# Patient Record
Sex: Female | Born: 1955
Health system: Southern US, Community
[De-identification: ages and names within clinical notes are randomized; demographics above are authoritative.]

## PROBLEM LIST (undated history)

## (undated) DIAGNOSIS — K222 Esophageal obstruction: Secondary | ICD-10-CM

## (undated) DIAGNOSIS — F419 Anxiety disorder, unspecified: Secondary | ICD-10-CM

## (undated) DIAGNOSIS — D649 Anemia, unspecified: Secondary | ICD-10-CM

## (undated) DIAGNOSIS — M5430 Sciatica, unspecified side: Secondary | ICD-10-CM

## (undated) DIAGNOSIS — G4700 Insomnia, unspecified: Secondary | ICD-10-CM

## (undated) DIAGNOSIS — G459 Transient cerebral ischemic attack, unspecified: Secondary | ICD-10-CM

## (undated) DIAGNOSIS — K449 Diaphragmatic hernia without obstruction or gangrene: Secondary | ICD-10-CM

## (undated) DIAGNOSIS — K259 Gastric ulcer, unspecified as acute or chronic, without hemorrhage or perforation: Secondary | ICD-10-CM

## (undated) DIAGNOSIS — G43909 Migraine, unspecified, not intractable, without status migrainosus: Secondary | ICD-10-CM

## (undated) DIAGNOSIS — M1990 Unspecified osteoarthritis, unspecified site: Secondary | ICD-10-CM

## (undated) DIAGNOSIS — K922 Gastrointestinal hemorrhage, unspecified: Secondary | ICD-10-CM

## (undated) DIAGNOSIS — I10 Essential (primary) hypertension: Secondary | ICD-10-CM

## (undated) HISTORY — DX: Anemia, unspecified: D64.9

## (undated) HISTORY — DX: Insomnia, unspecified: G47.00

## (undated) HISTORY — DX: Esophageal obstruction: K22.2

## (undated) HISTORY — DX: Gastric ulcer, unspecified as acute or chronic, without hemorrhage or perforation: K25.9

## (undated) HISTORY — DX: Essential (primary) hypertension: I10

## (undated) HISTORY — PX: CATARACT EXTRACTION, BILATERAL: SHX1313

## (undated) HISTORY — DX: Diaphragmatic hernia without obstruction or gangrene: K44.9

## (undated) HISTORY — PX: COLONOSCOPY: SHX174

## (undated) HISTORY — DX: Gastrointestinal hemorrhage, unspecified: K92.2

## (undated) HISTORY — DX: Unspecified osteoarthritis, unspecified site: M19.90

## (undated) HISTORY — DX: Anxiety disorder, unspecified: F41.9

---

## 2000-09-14 ENCOUNTER — Encounter (INDEPENDENT_AMBULATORY_CARE_PROVIDER_SITE_OTHER): Payer: Self-pay | Admitting: Specialist

## 2000-09-14 ENCOUNTER — Encounter: Payer: Self-pay | Admitting: Emergency Medicine

## 2000-09-14 ENCOUNTER — Encounter: Payer: Self-pay | Admitting: Neurology

## 2000-09-14 ENCOUNTER — Inpatient Hospital Stay (HOSPITAL_COMMUNITY): Admission: EM | Admit: 2000-09-14 | Discharge: 2000-09-20 | Payer: Self-pay | Admitting: Emergency Medicine

## 2000-09-14 HISTORY — PX: CAROTID ARTERY ANGIOPLASTY: SHX1300

## 2000-09-16 ENCOUNTER — Encounter: Payer: Self-pay | Admitting: Neurology

## 2001-08-23 ENCOUNTER — Emergency Department (HOSPITAL_COMMUNITY): Admission: EM | Admit: 2001-08-23 | Discharge: 2001-08-23 | Payer: Self-pay | Admitting: Emergency Medicine

## 2001-08-23 ENCOUNTER — Encounter: Payer: Self-pay | Admitting: Emergency Medicine

## 2010-11-01 NOTE — Op Note (Signed)
[place]. [facility]  Patient:    Tracy Jensen, Tracy Jensen                         MRN: [id number] Proc. Date: 09/18/00 Adm. Date:  [id number] Attending:  Fenton Malling CC:         Dr. Colon Flattery, [facility]  Genene Churn. Love, M.D.   Operative Report  PREOPERATIVE DIAGNOSIS:  Nondisabling left hemispheric cerebrovascular accident.  POSTOPERATIVE DIAGNOSES: 1. Nondisabling left hemispheric cerebrovascular accident. 2. Limited left common carotid artery dissection. 3. Left internal carotid artery stenosis.  PROCEDURES: 1. Left carotid endarterectomy. 2. Repair of limited left common carotid artery dissection. 3. Dacron patch angioplasty.  SURGEON:  Denman George, M.D.  ASSISTANT:  Tollie Pizza. Thomasena Edis, P.A.-C.  ANESTHESIA:  General endotracheal.  ANESTHESIOLOGIST:  Maren Beach, M.D.  CLINICAL NOTE:  This is a 55 year old female who was admitted to [facility] on September 14, 2000.  The patient presented with transient right-sided weakness and speech changes.  Workup for this including MRI revealed two small cerebrovascular accidents in the left hemisphere.  Doppler evaluation revealed left internal carotid artery stenosis.  Arteriography revealed a stenotic area at the left external carotid artery origin with a deep ulcer or pseudoaneurysm.  The patient was seen in consultation.  Very unusual arteriography findings were noted.  However, with the patients symptoms, it was felt that carotid endarterectomy and repair of the deep ulcer were indicated.  Risks and benefits of the operative procedure were explained to the patient and family in detail.  Major morbidity and mortality of approximately 2% to include but not limited to MI, CVA, cranial nerve injury, and death.  The patient consented for surgery.  DESCRIPTION OF PROCEDURE:  Patient brought to the operating room in stable condition.  Placed in supine position.  General endotracheal  anesthesia induced.  Foley catheter, arterial line in place.  Left neck prepped and draped in a sterile fashion.  Curvilinear skin incision made along the anterior border of the left sternomastoid muscle.  Incision extended deeply through the subcutaneous tissue.  Dissection carried down through the platysma with electrocautery. Deep dissection carried along the anterior border of the sternomastoid to the carotid sheath.  The common carotid artery was mobilized at the level of the omohyoid, encircled with a vessel loop.  Evaluation of the common carotid artery did reveal a double shadow appearance consistent with a possible dissection in the common carotid artery.  Distal dissection then carefully carried up to the carotid bifurcation.  The patient was administered 5000 units of heparin intravenously initially.  The origin of the left external carotid and superior thyroid were encircled with fine vessel loops.  The internal carotid artery dissected distally.  There was an area of apparent ulceration in the left internal carotid artery at its origin.  Distal to this, the artery appeared normal and was encircled with a vessel loop.  The hypoglossal nerve reflected superiorly and preserved.  The patient administered sodium additional 2000 units of heparin intravenously.  Following this, the carotid vessels were controlled with clamps.  A longitudinal arteriotomy made in the common carotid artery.  At this time, it was evident there was a recent dissection, which was limited to the left common carotid artery.  There was fresh thrombus present in the false lumen.  The arteriotomy was extended up across the carotid bulb and into the internal carotid artery.  There was plaque at the  left carotid bifurcation and a deep ulcer present.  The left internal carotid artery distal to this revealed thickened intima but no significant stenosis.  The left internal carotid artery was of small caliber,  however did initially accept a shunt. The shunt, however, could not be placed down into the common carotid artery out of fear for further damage with the dissection.  Therefore, the patient was not shunted.  Brisk backbleeding was present from the left internal carotid artery.  The neck incision was then extended proximally down to the sternal notch due to the extent of the common carotid dissection.  The common carotid artery mobilized down to this point.  The vagus nerve identified and reflected posteriorly and preserved.  The common carotid artery appeared normal at the sternal notch.  Clamped at this level and opened longitudinally down to this. The proximal origin of the dissection in the common carotid artery was identified.  The dissected segment was endarterectomized, and the thrombus was removed.  The endarterectomy carried up into the bulb, where the superior thyroid and external carotid were endarterectomized using eversion technique. The ulcerated segment in the internal carotid artery was endarterectomized, and the intima was divided distally.  There was some thickened intima distally in the internal carotid artery, and this was tacked down with interrupted 7-0 Prolene suture.  The long arteriotomy of the common carotid artery was then closed with a running 6-0 Prolene suture.  A patch angioplasty of the carotid bifurcation and the internal carotid artery was carried out with running 6-0 Prolene suture and a Finesse patch.  At completion of the patch angioplasty, all the vessels were flushed.  Clamps were removed.  Initial antegrade flow directed up the external carotid artery.  Following this, the internal carotid was released.  There was an excellent pulse and Doppler signal in the distal internal carotid artery.  Adequate hemostasis obtained.  The patient administered 50 mg of protamine intravenously.  The left neck incision was drained with a round Blake 15 [place]  drain.  This  was exited inferiorly through the skin, affixed to the skin with a 2-0 silk suture.  Adequate hemostasis obtained.  The patient administered 50 mg of protamine intravenously.  Sponge and instrument counts were correct.  Sternomastoid fascia closed with running 2-0 Vicryl suture.  Platysma closed with running 3-0 Vicryl suture.  Skin closed with 4-0 Monocryl.  Half-inch Steri-Strips applied.  Anesthesia was reversed in the operating room.  The patient awakened readily, moved all extremities to command.  Transferred to the recovery room in stable neurologic condition. DD:  09/18/00 TD:  09/19/00 Job: [id number]

## 2010-11-01 NOTE — H&P (Signed)
[place]. [facility]  Patient:    Tracy Jensen, Tracy Jensen                           MRN: [id number] Adm. Date:  09/14/00 Attending:  Kelli Hope, M.D.                         History and Physical  DATE OF BIRTH:  06-22-1955  CHIEF COMPLAINT:  Transient right-sided weakness and speech changes.  HISTORY OF PRESENT ILLNESS:  This is the first [facility] admission for Tracy Jensen, a 55 year old woman with a past medical history of dysfunctional uterine bleeding.  She was brought to the emergency room after suffering a transient acute neurological change this morning.  She says she awoke this morning at about 6:30 a.m. and found that she could not get out of bed due to weakness of her right upper extremity.  She says that the right upper extremity simply hung at her side, and she really could not move it or use it to do anything.  Also on trying to get out of bed, she noted trouble walking.  She was not really sure of why, but said that her right leg might have been weak.  She subsequently had to crawl to her sons room, and she said it took about one hour to get there.  Once she got there, she tried to tell him to call her doctor, but she had trouble getting words out.  She thinks that this was a word-finding problem, rather than a slurring of speech problem.  The whole episode lasted for approximately 75 minutes, and then largely resolved in terms of motor symptoms, although there is some mild word-finding problems which seem to have persisted a little longer until the time she was evaluated by the EMS.  Early this morning she saw her primary physician, who recommended that she come here, and she was brought down from Dr. Meredith Leeds office in [place] by EMS, for further evaluation.  Presently she feels completely normal.  She denies any history of previous similar events.  There is no associated chest pain, shortness of  breath, palpitations, or headache.  PAST MEDICAL HISTORY:  Remarkable for a long history of heavy menses for many years.  She has been on oral contraceptives for approximately 20 years for this reason.  FAMILY HISTORY:  Remarkable for diabetes and coronary artery disease in her mother.  SOCIAL HISTORY:  She denies tobacco use.  She lives with her husband.  She is normally independent in her activities of daily living.  ALLERGIES:  No known drug allergies.  CURRENT MEDICATIONS: 1. Oral contraceptives, exact dose unknown. 2. Multivitamins.  REVIEW OF SYSTEMS:  CONSTITUTIONAL:  No fever or chills, weight gain or weight loss.  HEAD:  No headache.  EYES:  She wears glasses.  No field loss.  EARS: No hearing changes.  ENT:  No URI symptoms.  CV:  No chest pain or palpitations.  RESPIRATORY:  No shortness of breath or cough.  GI:  No nausea, vomiting, or diarrhea.  GU:  No urinary symptoms.  HEMATOLOGY:  She does have a tendency to bruise easily.  PSYCH:  No change in thinking or moods.  PHYSICAL EXAMINATION:  VITAL SIGNS:  Temperature 98.8 degrees, blood pressure 142/84, pulse 72, respirations 18.  GENERAL:  She is alert with no evidence of stress.  HEENT:  Head:  Cranium is normocephalic, atraumatic.  Oropharynx benign.  NECK:  Supple without bruits.  CHEST:  Clear to auscultation.  HEART:  A regular rate and rhythm without murmurs.  ABDOMEN:  Soft, nondistended.  EXTREMITIES:  No edema, with 2+ pulses.  NEUROLOGIC:  Mental status:  She is awake, alert, completely oriented. Speech is fluent, not dysarthric.  She has no difficulty with confrontational naming.  Memory and concentration are intact.  Fund of knowledge is adequate. She is able to name the presidents to [employer].  Cranial nerves II-XII:  Funduscopic examination is benign.  Pupils equal, briskly reactive. Extraocular movements normal without nystagmus.  Visual fields full to confrontation.  Facial  strength and sensation are normal.  Hearing is intact to finger rub.  Face, tongue, palate normal and symmetric.  Shoulder shrug is symmetric.  Motor examination:  Normal bulk and tone.  Normal strength in all tested extremity muscles.  Sensation intact to light touch, vibratory, and pinprick sensation in all extremities.  Cerebellar:  Rapid alternating movements are performed normally.  Finger-to-nose, and heel-to-shin reveal no dysmetria.  Gait is normal.  She is able to heel, toe, and tandem walk. Reflexes are slightly brisk but not pathologic.  Toes are downgoing.  LABORATORY DATA:  CBC:  White cells 9.6, hemoglobin 11.7, platelets 206,000. I-STAT is unremarkable.  CT of the head is personally reviewed and is normal.  IMPRESSION:  Left brain transient ischemic attack in a young woman, with no risk factors except use of birth control pills.  PLAN: 1. MRI/MRA. 2. Carotid Dopplers. 3. Echocardiogram. 4. Workup for hypercoagulable state. 5. Heparin for now.  DISPOSITION:  Pending workup. DD:  09/14/00 TD:  09/14/00 Job: [id number]

## 2010-11-01 NOTE — Discharge Summary (Signed)
[place]. [facility]  Patient:    Tracy Jensen, Tracy Jensen                         MRN: [id number] Adm. Date:  [id number] Disc. Date: 09/20/00 Attending:  Fenton Malling Dictator:   Tollie Pizza. Thomasena Edis, P.A.-C. CC:         Colon Flattery, D.O., [place], [place]  Genene Churn. Love, M.D.   Discharge Summary  PRIMARY ADMITTING DIAGNOSIS:  Left cerebrovascular accident.  ADDITIONAL DISCHARGE DIAGNOSES: 1. Left common carotid artery and internal carotid artery dissection. 2. Dysfunctional uterine bleeding.  PROCEDURES PERFORMED: 1. Cerebral arteriogram. 2. Left carotid endarterectomy with Dacron patch angioplasty. 3. Repair of left common artery and internal carotid artery dissection.  HISTORY OF PRESENT ILLNESS:  The patient is a 55 year old, white female, with no significant past medical history except dysfunctional uterine bleeding for which she takes oral contraceptives.  She developed an acute onset of right-sided weakness prior to this admission with inability to move her right upper extremity and slurred speech.  This episode resolved after approximately 75 minutes.  However, the patient was seen by her primary care physician, who is Colon Flattery in [place], [place].  It is felt that she should undergo further evaluation, therefore she was sent by EMS to [facility] for further work-up.  HOSPITAL COURSE:  She was admitted and seen in consultation by neurology.  An MRI showed left middle cerebral artery infarction which was felt to be embolic in nature.  A transesophageal echocardiogram was performed to rule out cardiac source for emboli and this was negative.  A carotid Doppler study showed 40-60% left internal carotid artery stenosis.  A followup cerebral angiogram showed a questionable left ICA pseudoaneurysm.  It was felt that she should undergo surgical intervention for her carotid disease at this time.  She was seen in consultation by Dr. Madilyn Fireman and  was taken to the operating room on April 5 for left carotid endarterectomy.  Intraoperatively, she was found to have two discrete areas of dissection, one in the left common carotid artery and one in the internal carotid artery.  These areas both had evidence of hemorrhage and clot.  Both dissections were repaired and the artery was endarterectomized with Dacron patch angioplasty.  She tolerated the procedure well and was transferred from the recovery area to the floor in stable condition.  Postoperatively, her neurologic status has remained intact and she has had no further neurological symptoms.  She has remained afebrile and all vital signs have been stable.  She has been out of bed ambulating without difficulty and has been weaned off supplemental oxygen.  Her labs were within normal limits with hematocrit 29%, potassium 3.3, creatinine 0.7.  O2 saturations are 99% on room air.  It is felt that she can be discharged home.  DISCHARGE INSTRUCTIONS:  She is to refrain from heavy lifting or strenuous activity.  She will continue a low-fat, low sodium diet.  She may shower daily and clean her incisions with soap and water and dry thoroughly.  She is asked to call our office if she develops any swelling of the neck, incisional redness, or drainage, right-sided weakness or numbness, or fever greater than 101.  DISCHARGE MEDICATIONS: 1. Tylox 1-2 q.4h. p.r.n. for pain. 2. Enteric-coated aspirin 325 mg daily. 3. Pepcid 20 mg daily. 4. She is to continue her birth control pills which she was previously    taking at home.  FOLLOW-UP APPOINTMENT:  She will see Dr. Madilyn Fireman back in the office in three weeks for recheck and is asked to call Dr. Dewaine Conger for an appointment in three to four weeks following discharge. DD:  09/19/00 TD:  09/19/00 Job: [id number]

## 2016-02-08 ENCOUNTER — Ambulatory Visit: Payer: Self-pay | Admitting: Physician Assistant

## 2016-02-15 ENCOUNTER — Other Ambulatory Visit: Payer: Self-pay | Admitting: Physician Assistant

## 2016-02-22 ENCOUNTER — Other Ambulatory Visit: Payer: Self-pay | Admitting: Physician Assistant

## 2016-02-22 NOTE — Telephone Encounter (Signed)
Message left for patient that you are out of til Monday

## 2016-02-25 MED ORDER — BUTALBITAL-APAP-CAFFEINE 50-325-40 MG PO TABS: 1.0000 | ORAL_TABLET | Freq: Three times a day (TID) | ORAL | 0 refills | Status: DC | PRN

## 2016-02-25 NOTE — Telephone Encounter (Signed)
I am not certain of her medication. Can she have pharmacy send?

## 2016-02-25 NOTE — Telephone Encounter (Signed)
rx called into pharmacy

## 2016-04-01 ENCOUNTER — Other Ambulatory Visit: Payer: Self-pay | Admitting: Physician Assistant

## 2016-04-02 ENCOUNTER — Encounter (HOSPITAL_COMMUNITY): Payer: Self-pay

## 2016-04-02 ENCOUNTER — Emergency Department (HOSPITAL_COMMUNITY): Payer: 59

## 2016-04-02 ENCOUNTER — Telehealth: Payer: Self-pay | Admitting: *Deleted

## 2016-04-02 ENCOUNTER — Emergency Department (HOSPITAL_COMMUNITY)
Admission: EM | Admit: 2016-04-02 | Discharge: 2016-04-02 | Disposition: A | Discharge status: Home / Self Care | Payer: 59 | Attending: Emergency Medicine | Admitting: Emergency Medicine

## 2016-04-02 DIAGNOSIS — G510 Bell's palsy: Secondary | ICD-10-CM | POA: Insufficient documentation

## 2016-04-02 DIAGNOSIS — Z79899 Other long term (current) drug therapy: Secondary | ICD-10-CM | POA: Insufficient documentation

## 2016-04-02 DIAGNOSIS — Z8673 Personal history of transient ischemic attack (TIA), and cerebral infarction without residual deficits: Secondary | ICD-10-CM | POA: Diagnosis not present

## 2016-04-02 DIAGNOSIS — I639 Cerebral infarction, unspecified: Secondary | ICD-10-CM

## 2016-04-02 DIAGNOSIS — R29810 Facial weakness: Secondary | ICD-10-CM

## 2016-04-02 DIAGNOSIS — R791 Abnormal coagulation profile: Secondary | ICD-10-CM | POA: Insufficient documentation

## 2016-04-02 HISTORY — DX: Sciatica, unspecified side: M54.30

## 2016-04-02 HISTORY — DX: Transient cerebral ischemic attack, unspecified: G45.9

## 2016-04-02 HISTORY — DX: Migraine, unspecified, not intractable, without status migrainosus: G43.909

## 2016-04-02 LAB — I-STAT TROPONIN, ED: TROPONIN I, POC: 0 ng/mL (ref 0.00–0.08)

## 2016-04-02 LAB — DIFFERENTIAL
Basophils Absolute: 0 10*3/uL (ref 0.0–0.1)
Basophils Relative: 1 %
EOS ABS: 0.3 10*3/uL (ref 0.0–0.7)
EOS PCT: 6 %
LYMPHS ABS: 1.9 10*3/uL (ref 0.7–4.0)
LYMPHS PCT: 38 %
Monocytes Absolute: 0.4 10*3/uL (ref 0.1–1.0)
Monocytes Relative: 7 %
Neutro Abs: 2.4 10*3/uL (ref 1.7–7.7)
Neutrophils Relative %: 48 %

## 2016-04-02 LAB — COMPREHENSIVE METABOLIC PANEL
ALK PHOS: 83 U/L (ref 38–126)
ALT: 27 U/L (ref 14–54)
ANION GAP: 8 (ref 5–15)
AST: 23 U/L (ref 15–41)
Albumin: 4.2 g/dL (ref 3.5–5.0)
BILIRUBIN TOTAL: 0.5 mg/dL (ref 0.3–1.2)
BUN: 17 mg/dL (ref 6–20)
CALCIUM: 9.1 mg/dL (ref 8.9–10.3)
CO2: 25 mmol/L (ref 22–32)
Chloride: 106 mmol/L (ref 101–111)
Creatinine, Ser: 0.8 mg/dL (ref 0.44–1.00)
GFR calc non Af Amer: >60 mL/min (ref >60)
Glucose, Bld: 89 mg/dL (ref 65–99)
POTASSIUM: 3.4 mmol/L — AB (ref 3.5–5.1)
Sodium: 139 mmol/L (ref 135–145)
TOTAL PROTEIN: 6.7 g/dL (ref 6.5–8.1)

## 2016-04-02 LAB — I-STAT CHEM 8, ED
BUN: 20 mg/dL (ref 6–20)
CALCIUM ION: 1.13 mmol/L — AB (ref 1.15–1.40)
Chloride: 105 mmol/L (ref 101–111)
Creatinine, Ser: 0.8 mg/dL (ref 0.44–1.00)
Glucose, Bld: 86 mg/dL (ref 65–99)
HEMATOCRIT: 38 % (ref 36.0–46.0)
HEMOGLOBIN: 12.9 g/dL (ref 12.0–15.0)
Potassium: 3.5 mmol/L (ref 3.5–5.1)
SODIUM: 142 mmol/L (ref 135–145)
TCO2: 26 mmol/L (ref 0–100)

## 2016-04-02 LAB — CBC
HCT: 37.5 % (ref 36.0–46.0)
HEMOGLOBIN: 12.3 g/dL (ref 12.0–15.0)
MCH: 30.1 pg (ref 26.0–34.0)
MCHC: 32.8 g/dL (ref 30.0–36.0)
MCV: 91.9 fL (ref 78.0–100.0)
PLATELETS: 214 10*3/uL (ref 150–400)
RBC: 4.08 MIL/uL (ref 3.87–5.11)
RDW: 13 % (ref 11.5–15.5)
WBC: 5 10*3/uL (ref 4.0–10.5)

## 2016-04-02 LAB — ETHANOL: Alcohol, Ethyl (B): <5 mg/dL (ref <5)

## 2016-04-02 LAB — PROTIME-INR
INR: 0.92
PROTHROMBIN TIME: 12.4 s (ref 11.4–15.2)

## 2016-04-02 LAB — APTT: aPTT: 30 seconds (ref 24–36)

## 2016-04-02 MED ORDER — PREDNISONE 20 MG PO TABS: 40.0000 mg | ORAL_TABLET | Freq: Every day | ORAL | 0 refills | Status: DC

## 2016-04-02 MED ORDER — ERYTHROMYCIN 5 MG/GM OP OINT
1.0000 "application " | TOPICAL_OINTMENT | Freq: Once | OPHTHALMIC | Status: AC
  Administered 2016-04-02: 1 via OPHTHALMIC
  Filled 2016-04-02: qty 3.5

## 2016-04-02 MED ORDER — VALACYCLOVIR HCL 1 G PO TABS: 1000.0000 mg | ORAL_TABLET | Freq: Three times a day (TID) | ORAL | 0 refills | Status: AC

## 2016-04-02 NOTE — Consult Note (Signed)
Requesting Physician: Dr. Anitra Lauth    Chief Complaint: right sided facial droop  History obtained from:  Patient     HPI:                                                                                                                                         Tracy Jensen is an 60 y.o. female who is been suffering from headache for the last 2 days. Patient was doing well this morning when her husband noted at around 845 that she had a right-sided facial droop. He does admit that he was not really paying attention to her this morning is unclear when the actual last seen normal was. The circumflex was called however on physical exam patient has decreased ability to referral her right for head, decreased ability to bury her right eyelid along with week eyelid strength, and right facial droop--that is very indicative of a Bell's palsy. CT scan of brain was negative. Patient states that she has no phonophobia, decreased or blurred vision, slurred speech, upper or lower extremity strength decrease.  Date last known well: Woke up symptomatic, unknown Time last known well: Unable to determine tPA Given: No: minimal symptoms and likely bells palsy   Past Medical History:  Diagnosis Date  . Migraines   . Sciatica   . TIA (transient ischemic attack)     No past surgical history on file.  No family history on file. Social History:  reports that she has never smoked. She has never used smokeless tobacco. She reports that she drinks alcohol. She reports that she does not use drugs.  Allergies:  Allergies  Allergen Reactions  . Azithromycin Hives    Hives after Zpack    Medications:                                                                                                                           No current facility-administered medications for this encounter.    Current Outpatient Prescriptions  Medication Sig Dispense Refill  . butalbital-acetaminophen-caffeine (FIORICET, ESGIC)  50-325-40 MG tablet TAKE 1 TABLET EVERY 8 HOURS AS NEEDED FOR HEADACHE 30 tablet 0  . predniSONE (DELTASONE) 20 MG tablet Take 2 tablets (40 mg total) by mouth daily. 10 tablet 0  . valACYclovir (VALTREX) 1000 MG tablet Take 1 tablet (1,000 mg total) by mouth 3 (three) times  daily. 30 tablet 0     ROS:                                                                                                                                       History obtained from the patient  General ROS: negative for - chills, fatigue, fever, night sweats, weight gain or weight loss Psychological ROS: negative for - behavioral disorder, hallucinations, memory difficulties, mood swings or suicidal ideation Ophthalmic ROS: negative for - blurry vision, double vision, eye pain or loss of vision ENT ROS: negative for - epistaxis, nasal discharge, oral lesions, sore throat, tinnitus or vertigo Allergy and Immunology ROS: negative for - hives or itchy/watery eyes Hematological and Lymphatic ROS: negative for - bleeding problems, bruising or swollen lymph nodes Endocrine ROS: negative for - galactorrhea, hair pattern changes, polydipsia/polyuria or temperature intolerance Respiratory ROS: negative for - cough, hemoptysis, shortness of breath or wheezing Cardiovascular ROS: negative for - chest pain, dyspnea on exertion, edema or irregular heartbeat Gastrointestinal ROS: negative for - abdominal pain, diarrhea, hematemesis, nausea/vomiting or stool incontinence Genito-Urinary ROS: negative for - dysuria, hematuria, incontinence or urinary frequency/urgency Musculoskeletal ROS: negative for - joint swelling or muscular weakness Neurological ROS: as noted in HPI Dermatological ROS: negative for rash and skin lesion changes  Neurologic Examination:                                                                                                      Blood pressure 137/87, pulse 76, temperature 97.7 F (36.5 C), temperature  source Oral, resp. rate 19, SpO2 100 %.  HEENT-  Normocephalic, no lesions, without obvious abnormality.  Normal external eye and conjunctiva.  Normal TM's bilaterally.  Normal auditory canals and external ears. Normal external nose, mucus membranes and septum.  Normal pharynx. Cardiovascular- S1, S2 normal, pulses palpable throughout   Lungs- chest clear, no wheezing, rales, normal symmetric air entry Abdomen- normal findings: bowel sounds normal Extremities- no edema Lymph-no adenopathy palpable Musculoskeletal-no joint tenderness, deformity or swelling Skin-warm and dry, no hyperpigmentation, vitiligo, or suspicious lesions  Neurological Examination Mental Status: Alert, oriented, thought content appropriate.  Speech fluent without evidence of aphasia.  Able to follow 3 step commands without difficulty. Cranial Nerves: II: Discs flat bilaterally; Visual fields grossly normal, pupils equal, round, reactive to light and accommodation III,IV, VI: ptosis not present, extra-ocular motions intact bilaterally, when blinking her right eye closes slower than her left V,VII: smile asymmetric on the right, facial light touch  sensation normal bilaterally, decreased for furrowing of her right forehead VIII: hearing normal bilaterally IX,X: uvula rises symmetrically XI: bilateral shoulder shrug XII: midline tongue extension Motor: Right : Upper extremity   5/5    Left:     Upper extremity   5/5  Lower extremity   5/5     Lower extremity   5/5 Tone and bulk:normal tone throughout; no atrophy noted Sensory: Pinprick and light touch intact throughout, bilaterally Deep Tendon Reflexes: 2+ and symmetric throughout Plantars: Right: downgoing   Left: downgoing Cerebellar: normal finger-to-nose, normal rapid alternating movements and normal heel-to-shin test Gait: Not tested       Lab Results: Basic Metabolic Panel:  Recent Labs Lab 04/02/16 0919 04/02/16 0923  NA 139 142  K 3.4* 3.5  CL  106 105  CO2 25  --   GLUCOSE 89 86  BUN 17 20  CREATININE 0.80 0.80  CALCIUM 9.1  --     Liver Function Tests:  Recent Labs Lab 04/02/16 0919  AST 23  ALT 27  ALKPHOS 83  BILITOT 0.5  PROT 6.7  ALBUMIN 4.2   No results for input(s): LIPASE, AMYLASE in the last 168 hours. No results for input(s): AMMONIA in the last 168 hours.  CBC:  Recent Labs Lab 04/02/16 0919 04/02/16 0923  WBC 5.0  --   NEUTROABS 2.4  --   HGB 12.3 12.9  HCT 37.5 38.0  MCV 91.9  --   PLT 214  --     Cardiac Enzymes: No results for input(s): CKTOTAL, CKMB, CKMBINDEX, TROPONINI in the last 168 hours.  Lipid Panel: No results for input(s): CHOL, TRIG, HDL, CHOLHDL, VLDL, LDLCALC in the last 168 hours.  CBG: No results for input(s): GLUCAP in the last 168 hours.  Microbiology: No results found for this or any previous visit.  Coagulation Studies:  Recent Labs  04/02/16 0919  LABPROT 12.4  INR 0.92    Imaging: Ct Head Code Stroke Wo Contrast`  Result Date: 04/02/2016 CLINICAL DATA:  Code stroke.  Headache.  Right facial droop EXAM: CT HEAD WITHOUT CONTRAST TECHNIQUE: Contiguous axial images were obtained from the base of the skull through the vertex without intravenous contrast. COMPARISON:  None. FINDINGS: Brain: No evidence of acute infarction, hemorrhage, hydrocephalus, extra-axial collection or mass lesion/mass effect. Vascular: No hyperdense vessel or unexpected calcification. Skull: Negative Sinuses/Orbits: Negative Other: None ASPECTS (Alberta Stroke Program Early CT Score) - Ganglionic level infarction (caudate, lentiform nuclei, internal capsule, insula, M1-M3 cortex): 7 - Supraganglionic infarction (M4-M6 cortex): 3 Total score (0-10 with 10 being normal): 10 IMPRESSION: 1. Negative CT head 2. ASPECTS is 10 These results were called by telephone at the time of interpretation on 04/02/2016 at 9:35 am to [facility], who verbally acknowledged these results. Electronically Signed   By:  Marlan Palau M.D.   On: 04/02/2016 09:35     Assessment: 60 y.o. female presenting to [facility] after husband noted that she had a right facial droop. On exam she is a droop is very indicative of a Bell's palsy. She has decreased for head wrinkling on the right, decreased blink on right, and right facial droop, sensation intact. She has no upper or lower extremity weakness or decreased sensation. CT of head was negative.  Stroke Risk Factors - none   Recommend: 1) consider prednisone and acyclovir to hasten the recovery phase this is been discussed with Dr. Anitra Lauth 2) consider eyedrops if she is unable to fully close her  right eye.  No further recommendations. Neurology will sign off and patient has been placed back in the care of ED M.D.   Personally examined patient and images, and have participated in and made any corrections needed to history, physical, neuro exam,assessment and plan as stated above.  I have personally obtained the history, evaluated lab date, reviewed imaging studies and agree with radiology interpretations.    Naomie DeanAntonia Ahern, MD [facility] [phone]

## 2016-04-02 NOTE — Code Documentation (Signed)
60[facility] at [phone].  Patient from home where she and her husband were preparing for a trip when she had sudden onset right sided facial weakness at 0815.  Patient reports headache x 2 days, but denies headache today.  Patient presented to the ED where a code stroke was activated.  Stroke team to the bedside.  Patient to CT 2.  CT completed.  NIHSS 3, see documentation for details and code stroke times.  Patient with facial weakness to the upper and lower right face.  Dr. Lucia GaskinsAhern at the bedside.  Patient with suspected Bell's palsy.  No acute stroke treatment at this time.  Code stroke canceled.  Handoff with ED RN Nehemiah SettleBrooke.

## 2016-04-02 NOTE — ED Triage Notes (Signed)
Pt c/o h/a Monday and Tuesday, denies today. Pt husband noticed right sided facial droop this morning around 0830, uncertain if droop was present last night. No other weakness or complaints at this time. Hx mini stroke.

## 2016-04-02 NOTE — Telephone Encounter (Signed)
Pharm tech called to verify Rx given.

## 2016-04-02 NOTE — ED Provider Notes (Signed)
[facility]-EMERGENCY DEPT Provider Note   CSN: [id number] Arrival date & time: 04/02/16  [id number]   An emergency department physician performed an initial assessment on this suspected stroke patient at 0910 (Farron Watrous).  History   Chief Complaint Chief Complaint  Patient presents with  . Facial Droop    HPI Tracy Jensen is a 60 y.o. female.  Patient is a 60 year old female with a history of TIA, migraines and sciatica who presents today with a 2 day history of severe migraine in the right side of her head that resolved morning and 45 minutes prior to arrival she developed right-sided facial droop. She denies any numbness in the right side of her face, speech or swallowing difficulty. Vision is unchanged. She denies any upper or lower extremity involvement. No chest pain, shortness of breath or neck pain. Earlier she did have some burning in her ear on the right side which is now resolved.  No recent illnesses but [place] so [place] sick [place].   The history is provided by the patient.    Past Medical History:  Diagnosis Date  . Migraines   . Sciatica   . TIA (transient ischemic attack)     There are no active problems to display for this patient.   No past surgical history on file.  OB History    No data available       Home Medications    Prior to Admission medications   Medication Sig Start Date End Date Taking? Authorizing Provider  butalbital-acetaminophen-caffeine (FIORICET, ESGIC) 50-325-40 MG tablet TAKE 1 TABLET EVERY 8 HOURS AS NEEDED FOR HEADACHE 04/01/16   Remus LofflerAngel S Jones, PA-C    Family History No family history on file.  Social History Social History  Substance Use Topics  . Smoking status: Never Smoker  . Smokeless tobacco: Never Used  . Alcohol use Yes     Comment: occ     Allergies   Azithromycin   Review of Systems Review of Systems  All other systems reviewed and are negative.    Physical  Exam Updated Vital Signs BP 157/99 (BP Location: Right Arm)   Pulse 83   Temp 97.7 F (36.5 C)   Resp 13   SpO2 100%   Physical Exam  Constitutional: She is oriented to person, place, and time. She appears well-developed and well-nourished. No distress.  HENT:  Head: Normocephalic and atraumatic.  Right Ear: Tympanic membrane normal.  Left Ear: Tympanic membrane normal.  Mouth/Throat: Oropharynx is clear and moist.  No vesicles present in the right ear canal or on the TM  Eyes: Conjunctivae and EOM are normal. Pupils are equal, round, and reactive to light.  Neck: Normal range of motion. Neck supple.  Cardiovascular: Normal rate, regular rhythm and intact distal pulses.   No murmur heard. Pulmonary/Chest: Effort normal and breath sounds normal. No respiratory distress. She has no wheezes. She has no rales.  Abdominal: Soft. She exhibits no distension. There is no tenderness. There is no rebound and no guarding.  Musculoskeletal: Normal range of motion. She exhibits no edema or tenderness.  Neurological: She is alert and oriented to person, place, and time. She has normal strength. A cranial nerve deficit is present. No sensory deficit. Coordination and gait normal.  Right sided facial droop with forehead involvement.  Normal sensation over the face bilaterally.  5 out of 5 strength in the upper and lower extremities bilaterally  Skin: Skin is warm and dry.  No rash noted. No erythema.  Psychiatric: She has a normal mood and affect. Her behavior is normal.  Nursing note and vitals reviewed.    ED Treatments / Results  Labs (all labs ordered are listed, but only abnormal results are displayed) Labs Reviewed  COMPREHENSIVE METABOLIC PANEL - Abnormal; Notable for the following:       Result Value   Potassium 3.4 (*)    All other components within normal limits  I-STAT CHEM 8, ED - Abnormal; Notable for the following:    Calcium, Ion 1.13 (*)    All other components within normal  limits  PROTIME-INR  APTT  CBC  DIFFERENTIAL  ETHANOL  RAPID URINE DRUG SCREEN, HOSP PERFORMED  URINALYSIS, ROUTINE W REFLEX MICROSCOPIC (NOT AT [facility])  Rosezena Sensor, ED    EKG  EKG Interpretation  Date/Time:  Wednesday April 02 2016 09:13:31 EDT Ventricular Rate:  87 PR Interval:    QRS Duration: 103 QT Interval:  371 QTC Calculation: 447 R Axis:   -7 Text Interpretation:  Sinus rhythm Probable lateral infarct, old Artifact No previous tracing Confirmed by Anitra Lauth  MD, Sharman Garrott ([id number]) on 04/02/2016 9:55:52 AM       Radiology Ct Head Code Stroke Wo Contrast`  Result Date: 04/02/2016 CLINICAL DATA:  Code stroke.  Headache.  Right facial droop EXAM: CT HEAD WITHOUT CONTRAST TECHNIQUE: Contiguous axial images were obtained from the base of the skull through the vertex without intravenous contrast. COMPARISON:  None. FINDINGS: Brain: No evidence of acute infarction, hemorrhage, hydrocephalus, extra-axial collection or mass lesion/mass effect. Vascular: No hyperdense vessel or unexpected calcification. Skull: Negative Sinuses/Orbits: Negative Other: None ASPECTS (Alberta Stroke Program Early CT Score) - Ganglionic level infarction (caudate, lentiform nuclei, internal capsule, insula, M1-M3 cortex): 7 - Supraganglionic infarction (M4-M6 cortex): 3 Total score (0-10 with 10 being normal): 10 IMPRESSION: 1. Negative CT head 2. ASPECTS is 10 These results were called by telephone at the time of interpretation on 04/02/2016 at 9:35 am to [facility], who verbally acknowledged these results. Electronically Signed   By: Marlan Palau M.D.   On: 04/02/2016 09:35    Procedures Procedures (including critical care time)  Medications Ordered in ED Medications - No data to display   Initial Impression / Assessment and Plan / ED Course  I have reviewed the triage vital signs and the nursing notes.  Pertinent labs & imaging results that were available during my care of the patient were  reviewed by me and considered in my medical decision making (see chart for details).  Clinical Course    Patient presents with sudden onset of right-sided facial droop approximately 45 minutes prior to arrival. Headache noted for the last 2 days but now resolved. Patient has no other symptoms at this time. She does have a history of prior TIA but that was years ago resulting in a stent in her left carotid. She has no extremity complaints. Initially in triage patient was called a code stroke however based on exam and history this appears to be Bell's palsy. Neurology evaluated the patient agrees that she has Bell's palsy. Labs without acute findings within normal creatinine and CT negative. Will start patient on prednisone and Valtrex.  Final Clinical Impressions(s) / ED Diagnoses   Final diagnoses:  Bell's palsy    New Prescriptions New Prescriptions   PREDNISONE (DELTASONE) 20 MG TABLET    Take 2 tablets (40 mg total) by mouth daily.   VALACYCLOVIR (VALTREX) 1000 MG TABLET  Take 1 tablet (1,000 mg total) by mouth 3 (three) times daily.     Gwyneth Sprout, MD 04/02/16 1012

## 2016-04-07 ENCOUNTER — Ambulatory Visit: Payer: Self-pay | Admitting: Physician Assistant

## 2016-04-08 ENCOUNTER — Encounter: Payer: Self-pay | Admitting: Physician Assistant

## 2016-04-08 ENCOUNTER — Ambulatory Visit (INDEPENDENT_AMBULATORY_CARE_PROVIDER_SITE_OTHER): Payer: 59 | Admitting: Physician Assistant

## 2016-04-08 VITALS — BP 130/87 | HR 78 | Temp 98.3°F | Ht <= 58 in | Wt 117.4 lb

## 2016-04-08 DIAGNOSIS — F320 Major depressive disorder, single episode, mild: Secondary | ICD-10-CM

## 2016-04-08 DIAGNOSIS — I10 Essential (primary) hypertension: Secondary | ICD-10-CM | POA: Diagnosis not present

## 2016-04-08 DIAGNOSIS — F329 Major depressive disorder, single episode, unspecified: Secondary | ICD-10-CM | POA: Insufficient documentation

## 2016-04-08 DIAGNOSIS — Z8673 Personal history of transient ischemic attack (TIA), and cerebral infarction without residual deficits: Secondary | ICD-10-CM | POA: Diagnosis not present

## 2016-04-08 DIAGNOSIS — G510 Bell's palsy: Secondary | ICD-10-CM | POA: Diagnosis not present

## 2016-04-08 DIAGNOSIS — F32A Depression, unspecified: Secondary | ICD-10-CM | POA: Insufficient documentation

## 2016-04-08 MED ORDER — PREDNISONE 20 MG PO TABS: 40.0000 mg | ORAL_TABLET | Freq: Every day | ORAL | 0 refills | Status: DC

## 2016-04-08 NOTE — Patient Instructions (Addendum)
Bell Palsy °Bell palsy is a condition in which the muscles on one side of the face become paralyzed. This often causes one side of the face to droop. It is a common condition and most people recover completely. °RISK FACTORS °Risk factors for Bell palsy include: °· Pregnancy. °· Diabetes. °· An infection by a virus, such as infections that cause cold sores. °CAUSES  °Bell palsy is caused by damage to or inflammation of a nerve in your face. It is unclear why this happens, but an infection by a virus may lead to it. Most of the time the reason it happens is unknown. °SIGNS AND SYMPTOMS  °Symptoms can range from mild to severe and can take place over a number of hours. Symptoms may include: °· Being unable to: °¨ Raise one or both eyebrows. °¨ Close one or both eyes. °¨ Feel parts of your face (facial numbness). °· Drooping of the eyelid and corner of the mouth. °· Weakness in the face. °· Paralysis of half your face. °· Loss of taste. °· Sensitivity to loud noises. °· Difficulty chewing. °· Tearing up of the affected eye. °· Dryness in the affected eye. °· Drooling. °· Pain behind one ear. °DIAGNOSIS  °Diagnosis of Bell palsy may include: °· A medical history and physical exam. °· An MRI. °· A CT scan. °· Electromyography (EMG). This is a test that checks how your nerves are working. °TREATMENT  °Treatment may include antiviral medicine to help shorten the length of the condition. Sometimes treatment is not needed and the symptoms go away on their own. °HOME CARE INSTRUCTIONS  °· Take medicines only as directed by your health care provider. °· Do facial massages and exercises as directed by your health care provider. °· If your eye is affected: °¨ Use moisturizing eye drops to prevent drying of your eye as directed by your health care provider. °¨ Protect your eye as directed by your health care provider. °SEEK MEDICAL CARE IF: °· Your symptoms do not get better or get worse. °· You are drooling. °· Your eye is red,  irritated, or hurts. °SEEK IMMEDIATE MEDICAL CARE IF:  °· Another part of your body feels weak or numb. °· You have difficulty swallowing. °· You have a fever along with symptoms of Bell palsy. °· You develop neck pain. °MAKE SURE YOU:  °· Understand these instructions. °· Will watch your condition. °· Will get help right away if you are not doing well or get worse. °  °This information is not intended to replace advice given to you by your health care provider. Make sure you discuss any questions you have with your health care provider. °  °Document Released: 06/02/2005 Document Revised: 02/21/2015 Document Reviewed: 09/09/2013 °Elsevier Interactive Patient Education ©2016 [employer] ° °

## 2016-04-08 NOTE — Progress Notes (Signed)
BP 130/87   Pulse 78   Temp 98.3 F (36.8 C) (Oral)   Ht 4\' 9"  (1.448 m)   Wt 117 lb 6.4 oz (53.3 kg)   BMI 25.41 kg/m    Subjective:    Patient ID: Tracy Jensen, female    DOB: December 28, 1955, 60 y.o.   MRN: [id number]  Tracy GessLynn H Cabello is a 60 y.o. female presenting on 04/08/2016 for ER follow up (Bells Palsy )  HPI Patient here to be established as [facility].  This patient is known to me from [facility]. Approximately 6 days ago the patient was seen through the emergency room for bells palsy. Due to her previous history of a CVA and TIA activity she did have a CAT scan that was normal. She has had classic Bell's palsy symptoms of right-sided weakness and inability to blink. She is starting to have more pain in this area since the feeling is coming back. She is also seen her eye doctor for drops to help with the dryness. The eye doctor has reported that she start he had 50% improvement in just 2 days. She is to continue all the medications that were given to her through the emergency room. We will give her note for out today.  Past Medical History:  Diagnosis Date  . Hypertension   . Migraines   . Sciatica   . TIA (transient ischemic attack)    Relevant past medical, surgical, family and social history reviewed and updated as indicated. Interim medical history since our last visit reviewed. Allergies and medications reviewed and updated.   Data reviewed from any sources in EPIC.  Review of Systems  Eyes: Positive for photophobia and pain.  Respiratory: Negative.   Gastrointestinal: Negative.   Genitourinary: Negative.   Neurological: Positive for facial asymmetry, weakness, numbness and headaches.     Social History   Social History  . Marital status: Single    Spouse name: N/A  . Number of children: N/A  . Years of education: N/A   Occupational History  . Not on file.   Social History Main Topics  . Smoking status:  Never Smoker  . Smokeless tobacco: Never Used  . Alcohol use Yes     Comment: occ  . Drug use: No  . Sexual activity: Not on file   Other Topics Concern  . Not on file   Social History Narrative  . No narrative on file    History reviewed. No pertinent surgical history.  History reviewed. No pertinent family history.    Medication List       Accurate as of 04/08/16  2:49 PM. Always use your most recent med list.          ALPRAZolam 0.5 MG tablet Commonly known as:  XANAX Take 0.5 mg by mouth 3 (three) times daily as needed for anxiety.   aspirin EC 81 MG tablet Take 81 mg by mouth daily.   butalbital-acetaminophen-caffeine 50-325-40 MG tablet Commonly known as:  FIORICET, ESGIC TAKE 1 TABLET EVERY 8 HOURS AS NEEDED FOR HEADACHE   citalopram 40 MG tablet Commonly known as:  CELEXA Take 40 mg by mouth daily.   cyclobenzaprine 10 MG tablet Commonly known as:  FLEXERIL Take 10 mg by mouth 3 (three) times daily.   diclofenac 75 MG EC tablet Commonly known as:  VOLTAREN Take 75 mg by mouth 2 (two) times daily.   lisinopril 20 MG tablet Commonly known as:  PRINIVIL,ZESTRIL Take 20 mg by mouth daily.   predniSONE 20 MG tablet Commonly known as:  DELTASONE Take 2 tablets (40 mg total) by mouth daily.   traZODone 50 MG tablet Commonly known as:  DESYREL Take 100 mg by mouth at bedtime.   valACYclovir 1000 MG tablet Commonly known as:  VALTREX Take 1 tablet (1,000 mg total) by mouth 3 (three) times daily.          Objective:    BP 130/87   Pulse 78   Temp 98.3 F (36.8 C) (Oral)   Ht 4\' 9"  (1.448 m)   Wt 117 lb 6.4 oz (53.3 kg)   BMI 25.41 kg/m   Allergies  Allergen Reactions  . Azithromycin Hives    Hives after Zpack   Wt Readings from Last 3 Encounters:  04/08/16 117 lb 6.4 oz (53.3 kg)    Physical Exam  Constitutional: She is oriented to person, place, and time. She appears well-developed and well-nourished.  HENT:  Head:  Normocephalic and atraumatic.  Eyes: Conjunctivae and EOM are normal. Pupils are equal, round, and reactive to light.  Cardiovascular: Normal rate, regular rhythm, normal heart sounds and intact distal pulses.   Pulmonary/Chest: Effort normal and breath sounds normal.  Abdominal: Soft. Bowel sounds are normal.  Neurological: She is alert and oriented to person, place, and time. She has normal reflexes. She displays no tremor. A cranial nerve deficit and sensory deficit is present. She exhibits abnormal muscle tone.  Right trigeminal region with weakness, loss of motor and decreased sensation.  Vision is being watched by her eye doctor.  Skin: Skin is warm and dry. No rash noted.  Psychiatric: She has a normal mood and affect. Her behavior is normal. Judgment and thought content normal.        Assessment & Plan:   1. Bell's palsy - predniSONE (DELTASONE) 20 MG tablet; Take 2 tablets (40 mg total) by mouth daily.  Dispense: 10 tablet; Refill: 0 Completes eye drops and valtrex 2. History of CVA (cerebrovascular accident)  3. HTN (hypertension), benign  4. Mild single current episode of major depressive disorder (HCC)   Continue all other maintenance medications as listed above. Educational handout given for bell's palsy  Follow up plan: Return if symptoms worsen or fail to improve.  Remus Loffler PA-C [facility] [address]  [place], [place] [address] [phone]   04/08/2016, 2:49 PM

## 2016-04-14 ENCOUNTER — Telehealth: Payer: Self-pay | Admitting: Physician Assistant

## 2016-04-14 DIAGNOSIS — G510 Bell's palsy: Secondary | ICD-10-CM

## 2016-04-14 MED ORDER — PREDNISONE 20 MG PO TABS: 40.0000 mg | ORAL_TABLET | Freq: Every day | ORAL | 0 refills | Status: DC

## 2016-04-14 NOTE — Telephone Encounter (Signed)
Yes, you can send prednisone 10mg  6 day pack #1 as directed

## 2016-04-14 NOTE — Telephone Encounter (Signed)
Medication sent.

## 2016-04-15 NOTE — Telephone Encounter (Signed)
Patient aware that medication has been sent to the pharmacy 

## 2016-04-28 ENCOUNTER — Other Ambulatory Visit: Payer: Self-pay | Admitting: Physician Assistant

## 2016-05-06 ENCOUNTER — Encounter: Payer: Self-pay | Admitting: Pediatrics

## 2016-05-06 ENCOUNTER — Ambulatory Visit (INDEPENDENT_AMBULATORY_CARE_PROVIDER_SITE_OTHER): Payer: 59 | Admitting: Pediatrics

## 2016-05-06 VITALS — BP 137/87 | HR 86 | Temp 98.0°F | Resp 20 | Ht <= 58 in | Wt 116.8 lb

## 2016-05-06 DIAGNOSIS — H6502 Acute serous otitis media, left ear: Secondary | ICD-10-CM | POA: Diagnosis not present

## 2016-05-06 MED ORDER — AMOXICILLIN 500 MG PO CAPS: 500.0000 mg | ORAL_CAPSULE | Freq: Two times a day (BID) | ORAL | 0 refills | Status: DC

## 2016-05-06 NOTE — Progress Notes (Signed)
  Subjective:   Patient ID: Tracy Jensen, female    DOB: May 24, 1956, 60 y.o.   MRN: [id number] CC: Sore Throat; Headache; and Cough  HPI: Tracy Jensen is a 60 y.o. female presenting for Sore Throat; Headache; and Cough  Dry, scratchy irritated throat for past week Several people at work sick No fevers Some congestion, some headache hacky dry cough Taking zyrtec Feels slightly better today  Relevant past medical, surgical, family and social history reviewed. Allergies and medications reviewed and updated. History  Smoking Status  . Never Smoker  Smokeless Tobacco  . Never Used   ROS: Per HPI   Objective:    BP 137/87   Pulse 86   Temp 98 F (36.7 C) (Oral)   Resp 20   Ht 4\' 9"  (1.448 m)   Wt 116 lb 12.8 oz (53 kg)   SpO2 99%   BMI 25.28 kg/m   Wt Readings from Last 3 Encounters:  05/06/16 116 lb 12.8 oz (53 kg)  04/08/16 117 lb 6.4 oz (53.3 kg)    Gen: NAD, alert, cooperative with exam, NCAT, congested EYES: EOMI, no conjunctival injection, or no icterus ENT:  L ear red, bulging, clear fluid, OP without erythema LYMPH: no cervical LAD CV: NRRR, normal S1/S2, no murmur, distal pulses 2+ b/l Resp: CTABL, no wheezes, normal WOB Neuro: Alert and oriented  Assessment & Plan:  Tracy Jensen was seen today for sore throat, headache and cough.  Diagnoses and all orders for this visit:  Acute serous otitis media of left ear, recurrence not specified -     amoxicillin (AMOXIL) 500 MG capsule; Take 1 capsule (500 mg total) by mouth 2 (two) times daily.   Follow up plan: Return if symptoms worsen or fail to improve. Rex Krasarol Bleu Moisan, MD [facility]

## 2016-05-29 ENCOUNTER — Other Ambulatory Visit: Payer: Self-pay | Admitting: *Deleted

## 2016-05-29 MED ORDER — CYCLOBENZAPRINE HCL 10 MG PO TABS: 10.0000 mg | ORAL_TABLET | Freq: Three times a day (TID) | ORAL | 0 refills | Status: DC

## 2016-05-29 MED ORDER — CITALOPRAM HYDROBROMIDE 40 MG PO TABS: 40.0000 mg | ORAL_TABLET | Freq: Every day | ORAL | 0 refills | Status: DC

## 2016-06-02 ENCOUNTER — Other Ambulatory Visit: Payer: Self-pay | Admitting: Physician Assistant

## 2016-06-03 NOTE — Telephone Encounter (Signed)
Forwarding to PCP.

## 2016-06-05 ENCOUNTER — Other Ambulatory Visit: Payer: Self-pay | Admitting: Physician Assistant

## 2016-06-05 NOTE — Telephone Encounter (Signed)
Phoned in Approved [facility]

## 2016-07-01 ENCOUNTER — Other Ambulatory Visit: Payer: Self-pay | Admitting: *Deleted

## 2016-07-01 MED ORDER — TRAZODONE HCL 50 MG PO TABS: 100.0000 mg | ORAL_TABLET | Freq: Every day | ORAL | 11 refills | Status: DC

## 2016-07-07 ENCOUNTER — Other Ambulatory Visit: Payer: Self-pay | Admitting: Physician Assistant

## 2016-07-14 ENCOUNTER — Encounter: Payer: Self-pay | Admitting: Physician Assistant

## 2016-07-14 ENCOUNTER — Other Ambulatory Visit: Payer: Self-pay | Admitting: Physician Assistant

## 2016-07-14 ENCOUNTER — Ambulatory Visit (INDEPENDENT_AMBULATORY_CARE_PROVIDER_SITE_OTHER): Payer: 59 | Admitting: Physician Assistant

## 2016-07-14 VITALS — BP 103/74 | HR 67 | Temp 97.6°F | Ht <= 58 in | Wt 114.0 lb

## 2016-07-14 DIAGNOSIS — G43809 Other migraine, not intractable, without status migrainosus: Secondary | ICD-10-CM

## 2016-07-14 DIAGNOSIS — R37 Sexual dysfunction, unspecified: Secondary | ICD-10-CM | POA: Diagnosis not present

## 2016-07-14 DIAGNOSIS — M7021 Olecranon bursitis, right elbow: Secondary | ICD-10-CM

## 2016-07-14 DIAGNOSIS — F32A Depression, unspecified: Secondary | ICD-10-CM

## 2016-07-14 DIAGNOSIS — F329 Major depressive disorder, single episode, unspecified: Secondary | ICD-10-CM

## 2016-07-14 DIAGNOSIS — G43909 Migraine, unspecified, not intractable, without status migrainosus: Secondary | ICD-10-CM | POA: Insufficient documentation

## 2016-07-14 MED ORDER — PREDNISONE 10 MG (21) PO TBPK: ORAL_TABLET | ORAL | 0 refills | Status: DC

## 2016-07-14 MED ORDER — CITALOPRAM HYDROBROMIDE 40 MG PO TABS: 40.0000 mg | ORAL_TABLET | Freq: Every day | ORAL | 3 refills | Status: DC

## 2016-07-14 MED ORDER — METHYLPREDNISOLONE ACETATE 80 MG/ML IJ SUSP
80.0000 mg | Freq: Once | INTRAMUSCULAR | Status: AC
  Administered 2016-07-14: 80 mg via INTRAMUSCULAR

## 2016-07-14 MED ORDER — SILDENAFIL CITRATE 20 MG PO TABS: 20.0000 mg | ORAL_TABLET | Freq: Every day | ORAL | 0 refills | Status: DC

## 2016-07-14 MED ORDER — BUTALBITAL-APAP-CAFFEINE 50-325-40 MG PO TABS: 1.0000 | ORAL_TABLET | Freq: Three times a day (TID) | ORAL | 5 refills | Status: DC | PRN

## 2016-07-14 MED ORDER — CYCLOBENZAPRINE HCL 10 MG PO TABS
10.0000 mg | ORAL_TABLET | Freq: Three times a day (TID) | ORAL | 5 refills | Status: DC
Start: 2016-07-14 — End: 2017-03-31

## 2016-07-14 NOTE — Progress Notes (Signed)
BP 103/74   Pulse 67   Temp 97.6 F (36.4 C) (Oral)   Ht 4\' 9"  (1.448 m)   Wt 114 lb (51.7 kg)   BMI 24.67 kg/m    Subjective:    Patient ID: Tracy Jensen, female    DOB: 01/25/1956, 61 y.o.   MRN: [id number]  HPI: Tracy Jensen is a 61 y.o. female presenting on 07/14/2016 for Tennis elbow (Right arm )  Patient has had a recurrence of her right elbow tennis elbow. 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 She states she is in the most pain when she rotates the arm at the elbow such as in turning a doorknob. She denies any severe swelling or redness to the joint. There is been no trauma.  Past Medical History:  Diagnosis Date  . Hypertension   . Migraines   . Sciatica   . TIA (transient ischemic attack)    Relevant past medical, surgical, family and social history reviewed and updated as indicated. Interim medical history since our last visit reviewed. Allergies and medications reviewed and updated. DATA REVIEWED: CHART IN EPIC  Social History   Social History  . Marital status: Single    Spouse name: N/A  . Number of children: N/A  . Years of education: N/A   Occupational History  . Not on file.   Social History Main Topics  . Smoking status: Never Smoker  . Smokeless tobacco: Never Used  . Alcohol use Yes     Comment: occ  . Drug use: No  . Sexual activity: Not on file   Other Topics Concern  . Not on file   Social History Narrative  . No narrative on file    History reviewed. No pertinent surgical history.  History reviewed. No pertinent family history.  Review of Systems  Constitutional: Negative.  Negative for activity change, fatigue and fever.  HENT: Negative.   Eyes: Negative.   Respiratory: Negative.  Negative for cough.   Cardiovascular: Negative.  Negative for chest pain.  Gastrointestinal: Negative.  Negative for abdominal pain.  Endocrine: Negative.   Genitourinary: Negative.  Negative for dysuria.    Musculoskeletal: Positive for arthralgias. Negative for joint swelling, neck pain and neck stiffness.  Skin: Negative.   Neurological: Negative.     Allergies as of 07/14/2016      Reactions   Azithromycin Hives   Hives after Zpack      Medication List       Accurate as of 07/14/16  8:37 AM. Always use your most recent med list.          ALPRAZolam 0.5 MG tablet Commonly known as:  XANAX Take 0.5 mg by mouth 3 (three) times daily as needed for anxiety.   aspirin EC 81 MG tablet Take 81 mg by mouth daily.   butalbital-acetaminophen-caffeine 50-325-40 MG tablet Commonly known as:  FIORICET, ESGIC Take 1 tablet by mouth every 8 (eight) hours as needed for headache.   citalopram 40 MG tablet Commonly known as:  CELEXA Take 1 tablet (40 mg total) by mouth daily.   cyclobenzaprine 10 MG tablet Commonly known as:  FLEXERIL Take 1 tablet (10 mg total) by mouth 3 (three) times daily.   diclofenac 75 MG EC tablet Commonly known as:  VOLTAREN Take 75 mg by mouth 2 (two) times daily.   lisinopril 20 MG tablet Commonly known as:  PRINIVIL,ZESTRIL Take 20 mg by mouth daily.  predniSONE 10 MG (21) Tbpk tablet Commonly known as:  STERAPRED UNI-PAK 21 TAB As directed x 6 days   sildenafil 20 MG tablet Commonly known as:  REVATIO Take 1-2.5 tablets (20-50 mg total) by mouth daily.   traZODone 50 MG tablet Commonly known as:  DESYREL Take 2 tablets (100 mg total) by mouth at bedtime.          Objective:    BP 103/74   Pulse 67   Temp 97.6 F (36.4 C) (Oral)   Ht 4\' 9"  (1.448 m)   Wt 114 lb (51.7 kg)   BMI 24.67 kg/m   Allergies  Allergen Reactions  . Azithromycin Hives    Hives after Zpack    Wt Readings from Last 3 Encounters:  07/14/16 114 lb (51.7 kg)  05/06/16 116 lb 12.8 oz (53 kg)  04/08/16 117 lb 6.4 oz (53.3 kg)    Physical Exam  Constitutional: She is oriented to person, place, and time. She appears well-developed and well-nourished.  HENT:   Head: Normocephalic and atraumatic.  Right Ear: Tympanic membrane, external ear and ear canal normal.  Left Ear: Tympanic membrane, external ear and ear canal normal.  Nose: Nose normal. No rhinorrhea.  Mouth/Throat: Oropharynx is clear and moist and mucous membranes are normal. No oropharyngeal exudate or posterior oropharyngeal erythema.  Eyes: Conjunctivae and EOM are normal. Pupils are equal, round, and reactive to light.  Neck: Normal range of motion. Neck supple.  Cardiovascular: Normal rate, regular rhythm, normal heart sounds and intact distal pulses.   Pulmonary/Chest: Effort normal and breath sounds normal.  Abdominal: Soft. Bowel sounds are normal.  Musculoskeletal:       Right elbow: She exhibits swelling. She exhibits normal range of motion and no deformity. Tenderness found. Lateral epicondyle tenderness noted.       Arms: Neurological: She is alert and oriented to person, place, and time. She has normal reflexes.  Skin: Skin is warm and dry. No rash noted.  Psychiatric: She has a normal mood and affect. Her behavior is normal. Judgment and thought content normal.  Nursing note and vitals reviewed.      Assessment & Plan:   1. Olecranon bursitis of right elbow - methylPREDNISolone acetate (DEPO-MEDROL) injection 80 mg; Inject 1 mL (80 mg total) into the muscle once. - predniSONE (STERAPRED UNI-PAK 21 TAB) 10 MG (21) TBPK tablet; As directed x 6 days  Dispense: 21 tablet; Refill: 0  2. Other migraine without status migrainosus, not intractable - butalbital-acetaminophen-caffeine (FIORICET, ESGIC) 50-325-40 MG tablet; Take 1 tablet by mouth every 8 (eight) hours as needed for headache.  Dispense: 30 tablet; Refill: 5  3. Depression, unspecified depression type - citalopram (CELEXA) 40 MG tablet; Take 1 tablet (40 mg total) by mouth daily.  Dispense: 90 tablet; Refill: 3  4. Sexual dysfunction - sildenafil (REVATIO) 20 MG tablet; Take 1-2.5 tablets (20-50 mg total) by  mouth daily.  Dispense: 30 tablet; Refill: 0   Continue all other maintenance medications as listed above.  Follow up plan: Follow up as needed  Educational handout given for tennis elbow  Remus LofflerAngel S. Bralon Antkowiak PA-C [facility] [address]  [place], [place] [id number] [phone]   07/14/2016, 8:37 AM

## 2016-07-14 NOTE — Patient Instructions (Signed)
Tennis Elbow Tennis elbow is puffiness (inflammation) of the outer tendons of your forearm close to your elbow. Your tendons attach your muscles to your bones. Tennis elbow can happen in any sport or job in which you use your elbow too much. It is caused by doing the same motion over and over. Tennis elbow can cause:  Pain and tenderness in your forearm and the outer part of your elbow.  A burning feeling. This runs from your elbow through your arm.  Weak grip in your hands. Follow these instructions at home: Activity  Rest your elbow and wrist as told by your doctor. Try to avoid any activities that caused the problem until your doctor says that you can do them again.  If a physical therapist teaches you exercises, do all of them as told.  If you lift an object, lift it with your palm facing up. This is easier on your elbow. Lifestyle  If your tennis elbow is caused by sports, check your equipment and make sure that:  You are using it correctly.  It fits you well.  If your tennis elbow is caused by work, take breaks often, if you are able. Talk with your manager about doing your work in a way that is safe for you.  If your tennis elbow is caused by computer use, talk with your manager about any changes that can be made to your work setup. General instructions  If told, apply ice to the painful area:  Put ice in a plastic bag.  Place a towel between your skin and the bag.  Leave the ice on for 20 minutes, 2-3 times per day.  Take medicines only as told by your doctor.  If you were given a brace, wear it as told by your doctor.  Keep all follow-up visits as told by your doctor. This is important. Contact a doctor if:  Your pain does not get better with treatment.  Your pain gets worse.  You have weakness in your forearm, hand, or fingers.  You cannot feel your forearm, hand, or fingers. This information is not intended to replace advice given to you by your health  care provider. Make sure you discuss any questions you have with your health care provider. Document Released: 11/20/2009 Document Revised: 01/31/2016 Document Reviewed: 05/29/2014 Elsevier Interactive Patient Education  2017 Elsevier Inc.  

## 2016-08-07 ENCOUNTER — Other Ambulatory Visit: Payer: Self-pay | Admitting: *Deleted

## 2016-08-07 MED ORDER — DICLOFENAC SODIUM 75 MG PO TBEC: 75.0000 mg | DELAYED_RELEASE_TABLET | Freq: Two times a day (BID) | ORAL | 0 refills | Status: DC

## 2016-08-18 ENCOUNTER — Other Ambulatory Visit: Payer: Self-pay | Admitting: Physician Assistant

## 2016-08-18 DIAGNOSIS — G43809 Other migraine, not intractable, without status migrainosus: Secondary | ICD-10-CM

## 2016-08-18 NOTE — Telephone Encounter (Signed)
Last OV w/ angel - 06/2016. I think this one prints if approved

## 2016-09-03 ENCOUNTER — Other Ambulatory Visit: Payer: Self-pay | Admitting: Physician Assistant

## 2016-09-04 NOTE — Telephone Encounter (Signed)
Last filled 08/01/16, last seen 07/14/16. Route to pool for call in

## 2016-09-04 NOTE — Telephone Encounter (Signed)
Refill called to [facility] 

## 2016-09-06 ENCOUNTER — Other Ambulatory Visit: Payer: Self-pay | Admitting: Physician Assistant

## 2016-09-17 ENCOUNTER — Ambulatory Visit (INDEPENDENT_AMBULATORY_CARE_PROVIDER_SITE_OTHER): Payer: 59 | Admitting: Family Medicine

## 2016-09-17 ENCOUNTER — Encounter: Payer: Self-pay | Admitting: Family Medicine

## 2016-09-17 VITALS — BP 109/74 | HR 64 | Temp 97.3°F | Ht <= 58 in | Wt 114.0 lb

## 2016-09-17 DIAGNOSIS — M7711 Lateral epicondylitis, right elbow: Secondary | ICD-10-CM

## 2016-09-17 MED ORDER — METHOCARBAMOL 500 MG PO TABS: 500.0000 mg | ORAL_TABLET | Freq: Three times a day (TID) | ORAL | 0 refills | Status: DC | PRN

## 2016-09-17 MED ORDER — PREDNISONE 20 MG PO TABS: ORAL_TABLET | ORAL | 0 refills | Status: DC

## 2016-09-17 NOTE — Progress Notes (Signed)
BP 109/74   Pulse 64   Temp 97.3 F (36.3 C) (Oral)   Ht  (1.448 m)   Wt 114 lb (51.7 kg)   BMI 24.67 kg/m    Subjective:    Patient ID: Tracy Jensen, female    DOB: 1956-01-10, 61 y.o.   MRN: [id number]  HPI: Tracy Jensen is a 61 y.o. female presenting on 09/17/2016 for Elbow Pain (pain in right elbow radiating into upper arm, has history of tennis elbow and usually given prednisone dose pack)   HPI Right lateral elbow pain Patient is having right lateral elbow pain with repetitive movements that she doesn't work. She had this once in December and January and it improved with prednisone and it just came back about a week ago. She says she does a lot of repetitive forward and back motions with her hands and elbow and that's when it started to flare up again. She says it hurts on the lateral aspect of her right elbow area she denies any redness or warmth or numbness or weakness in the arm or hand. She denies any shooting pains anywhere else. She rates the pain is 410. She has been taking Tylenol and Flexeril in the Xanax and had helped that yesterday.  Relevant past medical, surgical, family and social history reviewed and updated as indicated. Interim medical history since our last visit reviewed. Allergies and medications reviewed and updated.  Review of Systems  Constitutional: Negative for chills and fever.  Respiratory: Negative for chest tightness and shortness of breath.   Cardiovascular: Negative for chest pain and leg swelling.  Genitourinary: Negative for difficulty urinating and dysuria.  Musculoskeletal: Positive for arthralgias. Negative for back pain and gait problem.  Skin: Negative for color change and rash.  Neurological: Negative for light-headedness and headaches.  Psychiatric/Behavioral: Negative for agitation and behavioral problems.  All other systems reviewed and are negative.   Per HPI unless specifically indicated above     Objective:    BP 109/74    Pulse 64   Temp 97.3 F (36.3 C) (Oral)   Ht  (1.448 m)   Wt 114 lb (51.7 kg)   BMI 24.67 kg/m   Wt Readings from Last 3 Encounters:  09/17/16 114 lb (51.7 kg)  07/14/16 114 lb (51.7 kg)  05/06/16 116 lb 12.8 oz (53 kg)    Physical Exam  Constitutional: She is oriented to person, place, and time. She appears well-developed and well-nourished. No distress.  Eyes: Conjunctivae are normal.  Musculoskeletal: Normal range of motion. She exhibits no edema.       Right elbow: She exhibits normal range of motion and no swelling. Tenderness found. Lateral epicondyle tenderness noted. No radial head, no medial epicondyle and no olecranon process tenderness noted.  Neurological: She is alert and oriented to person, place, and time. Coordination normal.  Skin: Skin is warm and dry. No rash noted. She is not diaphoretic.  Psychiatric: She has a normal mood and affect. Her behavior is normal.  Nursing note and vitals reviewed.     Assessment & Plan:   Problem List Items Addressed This Visit    None    Visit Diagnoses    Right lateral epicondylitis    -  Primary   Relevant Medications   methocarbamol (ROBAXIN) 500 MG tablet   predniSONE (DELTASONE) 20 MG tablet     Gave prednisone and Robaxin, return if worsens or does not improve.   Follow up plan: Return  if symptoms worsen or fail to improve.  Counseling provided for all of the vaccine components No orders of the defined types were placed in this encounter.   Arville Care, MD [facility] 09/17/2016, 9:19 AM

## 2016-10-29 ENCOUNTER — Other Ambulatory Visit: Payer: Self-pay | Admitting: Physician Assistant

## 2016-10-29 DIAGNOSIS — G43809 Other migraine, not intractable, without status migrainosus: Secondary | ICD-10-CM

## 2016-10-29 MED ORDER — BUTALBITAL-APAP-CAFFEINE 50-325-40 MG PO TABS: 1.0000 | ORAL_TABLET | Freq: Three times a day (TID) | ORAL | 5 refills | Status: DC

## 2016-10-29 NOTE — Progress Notes (Signed)
Rx phoned in.   

## 2016-10-30 NOTE — Telephone Encounter (Signed)
[facility] 

## 2016-11-04 ENCOUNTER — Other Ambulatory Visit: Payer: Self-pay | Admitting: Physician Assistant

## 2016-11-04 DIAGNOSIS — R37 Sexual dysfunction, unspecified: Secondary | ICD-10-CM

## 2016-11-10 ENCOUNTER — Other Ambulatory Visit: Payer: Self-pay | Admitting: Physician Assistant

## 2016-11-11 NOTE — Telephone Encounter (Signed)
Last filled 10/08/16, last seen 07/14/16. Route to pool for call in

## 2016-11-11 NOTE — Telephone Encounter (Signed)
Phoned in.

## 2016-11-27 ENCOUNTER — Other Ambulatory Visit: Payer: Self-pay | Admitting: Physician Assistant

## 2016-12-11 ENCOUNTER — Encounter: Payer: Self-pay | Admitting: Pediatrics

## 2016-12-11 ENCOUNTER — Ambulatory Visit (INDEPENDENT_AMBULATORY_CARE_PROVIDER_SITE_OTHER): Payer: 59 | Admitting: Pediatrics

## 2016-12-11 VITALS — BP 114/70 | HR 70 | Temp 97.2°F | Ht <= 58 in | Wt 113.0 lb

## 2016-12-11 DIAGNOSIS — M7711 Lateral epicondylitis, right elbow: Secondary | ICD-10-CM | POA: Diagnosis not present

## 2016-12-11 DIAGNOSIS — G43809 Other migraine, not intractable, without status migrainosus: Secondary | ICD-10-CM

## 2016-12-11 MED ORDER — PREDNISONE 10 MG PO TABS: ORAL_TABLET | ORAL | 0 refills | Status: DC

## 2016-12-11 MED ORDER — NAPROXEN 500 MG PO TABS: 500.0000 mg | ORAL_TABLET | Freq: Two times a day (BID) | ORAL | 0 refills | Status: DC

## 2016-12-11 MED ORDER — BUTALBITAL-APAP-CAFFEINE 50-325-40 MG PO TABS: 1.0000 | ORAL_TABLET | Freq: Three times a day (TID) | ORAL | 5 refills | Status: DC

## 2016-12-11 NOTE — Patient Instructions (Addendum)
Tennis Elbow Rehab Ask your health care provider which exercises are safe for you. Do exercises exactly as told by your health care provider and adjust them as directed. It is normal to feel mild stretching, pulling, tightness, or discomfort as you do these exercises, but you should stop right away if you feel sudden pain or your pain gets worse. Do not begin these exercises until told by your health care provider. Stretching and range of motion exercises These exercises warm up your muscles and joints and improve the movement and flexibility of your elbow. These exercises also help to relieve pain, numbness, and tingling. Exercise A: Wrist extensor stretch 1. Extend your left / right elbow with your fingers pointing down. 2. Gently pull the palm of your left / right hand toward you until you feel a gentle stretch on the top of your forearm. 3. To increase the stretch, push your left / right hand toward the outer edge or pinkie side of your forearm. 4. Hold this position for __________ seconds. Repeat __________ times. Complete this exercise __________ times a day. If directed by your health care provider, repeat this stretch except do it with a bent elbow this time. Exercise B: Wrist flexor stretch  1. Extend your left / right elbow and turn your palm upward. 2. Gently pull your left / right palm and fingertips back so your wrist extends and your fingers point more toward the ground. 3. You should feel a gentle stretch on the inside of your forearm. 4. Hold this position for __________ seconds. Repeat __________ times. Complete this exercise __________ times a day. If directed by your health care provider, repeat this stretch except do it with a bent elbow this time. 1. Slowly return to the starting position.

## 2016-12-11 NOTE — Progress Notes (Signed)
  Subjective:   Patient ID: Tracy Jensen, female    DOB: Sep 05, 1955, 61 y.o.   MRN: [id number] CC: Elbow Pain (right, Tennis Elbow)  HPI: Tracy Jensen is a 61 y.o. 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 Allergies and medications reviewed and updated. History  Smoking Status  . Never Smoker  Smokeless Tobacco  . Never Used   ROS: Per HPI   Objective:    BP 114/70   Pulse 70   Temp 97.2 F (36.2 C) (Oral)   Ht 4\' 9"  (1.448 m)   Wt 113 lb (51.3 kg)   BMI 24.45 kg/m   Wt Readings from Last 3 Encounters:  12/11/16 113 lb (51.3 kg)  09/17/16 114 lb (51.7 kg)  07/14/16 114 lb (51.7 kg)    Gen: NAD, alert, cooperative with exam, NCAT EYES: EOMI, no conjunctival injection, or no icterus CV: NRRR, normal S1/S2, no murmur, distal pulses 2+ b/l Resp: CTABL, no wheezes, normal WOB Ext: No edema, warm Neuro: Alert and oriented, strength equal b/l UE and LE, coordination grossly normal MSK: normal R elbow to inspection, no redness or swelling ttp over lateral epicondyle, pain with supination against resistance  Assessment & Plan:  Tracy Jensen was seen today for elbow pain.  Diagnoses and all orders for this visit:  Lateral epicondylitis of right elbow Not improving, has continued to use R arm regularly Rest, ice, counterforce brace, avoid movements that exacerbate pain Gentle stretching given -     naproxen (NAPROSYN) 500 MG tablet; Take 1 tablet (500 mg total) by mouth 2 (two) times daily with a  meal. -     predniSONE (DELTASONE) 10 MG tablet; Take 30mg  for 3 days, then 20 mg for 2 days then 10mg  for 1 day  Other migraine without status migrainosus, not intractable Stable, cont below, f/u with PCP -     butalbital-acetaminophen-caffeine (FIORICET, ESGIC) 50-325-40 MG tablet; Take 1 tablet by mouth every 8 (eight) hours.   Follow up plan: Return if symptoms worsen or fail to improve. Rex Krasarol Destyne Goodreau, MD [facility]

## 2017-01-05 ENCOUNTER — Other Ambulatory Visit: Payer: Self-pay | Admitting: *Deleted

## 2017-01-05 MED ORDER — DICLOFENAC SODIUM 75 MG PO TBEC: 75.0000 mg | DELAYED_RELEASE_TABLET | Freq: Two times a day (BID) | ORAL | 5 refills | Status: DC

## 2017-01-05 NOTE — Telephone Encounter (Signed)
Pt aware.

## 2017-02-05 ENCOUNTER — Other Ambulatory Visit: Payer: Self-pay | Admitting: Physician Assistant

## 2017-02-06 NOTE — Telephone Encounter (Signed)
rx called into pharmacy

## 2017-02-14 ENCOUNTER — Other Ambulatory Visit: Payer: Self-pay | Admitting: Physician Assistant

## 2017-02-14 DIAGNOSIS — R37 Sexual dysfunction, unspecified: Secondary | ICD-10-CM

## 2017-03-23 ENCOUNTER — Telehealth: Payer: Self-pay | Admitting: Physician Assistant

## 2017-03-23 MED ORDER — LISINOPRIL 20 MG PO TABS: 20.0000 mg | ORAL_TABLET | Freq: Every day | ORAL | 3 refills | Status: DC

## 2017-03-23 NOTE — Telephone Encounter (Signed)
Sent med

## 2017-03-31 ENCOUNTER — Other Ambulatory Visit: Payer: Self-pay | Admitting: Physician Assistant

## 2017-04-25 ENCOUNTER — Other Ambulatory Visit: Payer: Self-pay | Admitting: Physician Assistant

## 2017-04-27 NOTE — Telephone Encounter (Signed)
Phoned in.

## 2017-05-05 ENCOUNTER — Encounter: Payer: Self-pay | Admitting: Family

## 2017-05-05 ENCOUNTER — Ambulatory Visit: Payer: 59 | Admitting: Family Medicine

## 2017-05-05 ENCOUNTER — Ambulatory Visit (INDEPENDENT_AMBULATORY_CARE_PROVIDER_SITE_OTHER): Payer: 59 | Admitting: Family

## 2017-05-05 VITALS — BP 116/79 | HR 80 | Temp 97.2°F | Ht <= 58 in | Wt 115.6 lb

## 2017-05-05 DIAGNOSIS — J301 Allergic rhinitis due to pollen: Secondary | ICD-10-CM | POA: Diagnosis not present

## 2017-05-05 MED ORDER — FLUTICASONE PROPIONATE 50 MCG/ACT NA SUSP: 2.0000 | Freq: Every day | NASAL | 6 refills | Status: DC

## 2017-05-05 NOTE — Progress Notes (Signed)
   Subjective:    Patient ID: Rosalyn GessLynn H Bradsher, female    DOB: 1956/03/04, 61 y.o.   MRN: [id number]  Otalgia   There is pain in the right ear. This is a new problem. The current episode started today. The problem occurs constantly. The problem has been unchanged. There has been no fever. The pain is at a severity of 8/10. The pain is moderate. Associated symptoms include headaches. Pertinent negatives include no coughing, ear discharge, hearing loss, neck pain, rhinorrhea or sore throat. She has tried acetaminophen for the symptoms. The treatment provided mild relief.      Review of Systems  HENT: Positive for ear pain. Negative for ear discharge, hearing loss, rhinorrhea and sore throat.   Respiratory: Negative for cough.   Musculoskeletal: Negative for neck pain.  Neurological: Positive for headaches.  All other systems reviewed and are negative.      Objective:   Physical Exam  Constitutional: She is oriented to person, place, and time. She appears well-developed and well-nourished. No distress.  HENT:  Head: Normocephalic and atraumatic.  Right Ear: External ear normal. A middle ear effusion is present.  Left Ear: External ear normal. A middle ear effusion is present.  Nose: Mucosal edema and rhinorrhea present.  Mouth/Throat: Oropharynx is clear and moist.  Eyes: Pupils are equal, round, and reactive to light.  Neck: Normal range of motion. Neck supple. No thyromegaly present.  Cardiovascular: Normal rate, regular rhythm, normal heart sounds and intact distal pulses.  No murmur heard. Pulmonary/Chest: Effort normal and breath sounds normal. No respiratory distress. She has no wheezes.  Abdominal: Soft. Bowel sounds are normal. She exhibits no distension. There is no tenderness.  Musculoskeletal: Normal range of motion. She exhibits no edema or tenderness.  Neurological: She is alert and oriented to person, place, and time.  Skin: Skin is warm and dry.  Psychiatric: She has a  normal mood and affect. Her behavior is normal. Judgment and thought content normal.  Vitals reviewed.     BP 116/79   Pulse 80   Temp (!) 97.2 F (36.2 C) (Oral)   Ht 4\' 9"  (1.448 m)   Wt 115 lb 9.6 oz (52.4 kg)   BMI 25.02 kg/m      Assessment & Plan:  1. Allergic rhinitis due to pollen, unspecified seasonality Avoid allergens  Continue Zyrtec  Use humidifier  Saline spray as needed RTO Prn  - fluticasone (FLONASE) 50 MCG/ACT nasal spray; Place 2 sprays into both nostrils daily.  Dispense: 16 g; Refill: 6    Jannifer Rodneyhristy Deshay Blumenfeld, FNP

## 2017-05-05 NOTE — Patient Instructions (Signed)
Allergic Rhinitis Allergic rhinitis is when the mucous membranes in the nose respond to allergens. Allergens are particles in the air that cause your body to have an allergic reaction. This causes you to release allergic antibodies. Through a chain of events, these eventually cause you to release histamine into the blood stream. Although meant to protect the body, it is this release of histamine that causes your discomfort, such as frequent sneezing, congestion, and an itchy, runny nose. What are the causes? Seasonal allergic rhinitis (hay fever) is caused by pollen allergens that may come from grasses, trees, and weeds. Year-round allergic rhinitis (perennial allergic rhinitis) is caused by allergens such as house dust mites, pet dander, and mold spores. What are the signs or symptoms?  Nasal stuffiness (congestion).  Itchy, runny nose with sneezing and tearing of the eyes. How is this diagnosed? Your health care provider can help you determine the allergen or allergens that trigger your symptoms. If you and your health care provider are unable to determine the allergen, skin or blood testing may be used. Your health care provider will diagnose your condition after taking your health history and performing a physical exam. Your health care provider may assess you for other related conditions, such as asthma, pink eye, or an ear infection. How is this treated? Allergic rhinitis does not have a cure, but it can be controlled by:  Medicines that block allergy symptoms. These may include allergy shots, nasal sprays, and oral antihistamines.  Avoiding the allergen. Hay fever may often be treated with antihistamines in pill or nasal spray forms. Antihistamines block the effects of histamine. There are over-the-counter medicines that may help with nasal congestion and swelling around the eyes. Check with your health care provider before taking or giving this medicine. If avoiding the allergen or the  medicine prescribed do not work, there are many new medicines your health care provider can prescribe. Stronger medicine may be used if initial measures are ineffective. Desensitizing injections can be used if medicine and avoidance does not work. Desensitization is when a patient is given ongoing shots until the body becomes less sensitive to the allergen. Make sure you follow up with your health care provider if problems continue. Follow these instructions at home: It is not possible to completely avoid allergens, but you can reduce your symptoms by taking steps to limit your exposure to them. It helps to know exactly what you are allergic to so that you can avoid your specific triggers. Contact a health care provider if:  You have a fever.  You develop a cough that does not stop easily (persistent).  You have shortness of breath.  You start wheezing.  Symptoms interfere with normal daily activities. This information is not intended to replace advice given to you by your health care provider. Make sure you discuss any questions you have with your health care provider. Document Released: 02/25/2001 Document Revised: 02/01/2016 Document Reviewed: 02/07/2013 Elsevier Interactive Patient Education  2017 Else[employer] Inc.  

## 2017-06-18 ENCOUNTER — Other Ambulatory Visit: Payer: Self-pay | Admitting: Physician Assistant

## 2017-06-23 ENCOUNTER — Encounter: Payer: Self-pay | Admitting: Family Medicine

## 2017-06-23 ENCOUNTER — Ambulatory Visit (INDEPENDENT_AMBULATORY_CARE_PROVIDER_SITE_OTHER): Payer: 59 | Admitting: Family Medicine

## 2017-06-23 VITALS — BP 124/79 | HR 83 | Temp 97.1°F | Ht <= 58 in | Wt 116.0 lb

## 2017-06-23 DIAGNOSIS — J40 Bronchitis, not specified as acute or chronic: Secondary | ICD-10-CM

## 2017-06-23 DIAGNOSIS — J329 Chronic sinusitis, unspecified: Secondary | ICD-10-CM | POA: Diagnosis not present

## 2017-06-23 MED ORDER — MOXIFLOXACIN HCL 400 MG PO TABS: 400.0000 mg | ORAL_TABLET | Freq: Every day | ORAL | 0 refills | Status: DC

## 2017-06-23 MED ORDER — HYDROCODONE-HOMATROPINE 5-1.5 MG/5ML PO SYRP: 5.0000 mL | ORAL_SOLUTION | Freq: Four times a day (QID) | ORAL | 0 refills | Status: DC | PRN

## 2017-06-23 MED ORDER — PSEUDOEPHEDRINE-GUAIFENESIN ER 60-600 MG PO TB12: 1.0000 | ORAL_TABLET | Freq: Two times a day (BID) | ORAL | 0 refills | Status: AC

## 2017-06-23 NOTE — Progress Notes (Signed)
Chief Complaint  Patient presents with  . Cough  . Nasal Congestion  . Allergies    itching/ watery eyes    HPI  Patient presents today for Patient presents with upper respiratory congestion. Rhinorrhea that is frequently purulent. There is moderate sore throat. Patient reports coughing frequently as well. Thick sputum noted. There is no fever, chills or sweats. The patient denies being short of breath. Onset was 1 week ago. Gradually worsening. Tried OTCs without improvement.  PMH: Smoking status noted ROS: Per HPI  Objective: BP 124/79   Pulse 83   Temp (!) 97.1 F (36.2 C) (Oral)   Ht 4\' 9"  (1.448 m)   Wt 116 lb (52.6 kg)   BMI 25.10 kg/m  Gen: NAD, alert, cooperative with exam HEENT: NCAT, Nasal passages swollen, red TMS RED CV: RRR, good S1/S2, no murmur Resp: Bronchitis changes with scattered wheezes, non-labored Ext: No edema, warm Neuro: Alert and oriented, No gross deficits  Assessment and plan:  1. Sinobronchitis     No orders of the defined types were placed in this encounter.   No orders of the defined types were placed in this encounter.   Follow up as needed.  Mechele ClaudeWarren Jack Mineau, MD

## 2017-06-29 ENCOUNTER — Ambulatory Visit: Payer: 59 | Admitting: Physician Assistant

## 2017-07-03 ENCOUNTER — Encounter: Payer: Self-pay | Admitting: Physician Assistant

## 2017-07-03 ENCOUNTER — Ambulatory Visit (INDEPENDENT_AMBULATORY_CARE_PROVIDER_SITE_OTHER): Payer: 59 | Admitting: Physician Assistant

## 2017-07-03 VITALS — BP 124/80 | HR 72 | Temp 97.5°F | Ht <= 58 in | Wt 114.2 lb

## 2017-07-03 DIAGNOSIS — F411 Generalized anxiety disorder: Secondary | ICD-10-CM

## 2017-07-03 DIAGNOSIS — F32A Depression, unspecified: Secondary | ICD-10-CM

## 2017-07-03 DIAGNOSIS — J0111 Acute recurrent frontal sinusitis: Secondary | ICD-10-CM

## 2017-07-03 DIAGNOSIS — G43809 Other migraine, not intractable, without status migrainosus: Secondary | ICD-10-CM

## 2017-07-03 DIAGNOSIS — F329 Major depressive disorder, single episode, unspecified: Secondary | ICD-10-CM | POA: Diagnosis not present

## 2017-07-03 DIAGNOSIS — R37 Sexual dysfunction, unspecified: Secondary | ICD-10-CM | POA: Diagnosis not present

## 2017-07-03 MED ORDER — AMOXICILLIN-POT CLAVULANATE 875-125 MG PO TABS: 1.0000 | ORAL_TABLET | Freq: Two times a day (BID) | ORAL | 0 refills | Status: DC

## 2017-07-03 MED ORDER — SILDENAFIL CITRATE 20 MG PO TABS: ORAL_TABLET | ORAL | 5 refills | Status: DC

## 2017-07-03 MED ORDER — DICLOFENAC SODIUM 75 MG PO TBEC: 75.0000 mg | DELAYED_RELEASE_TABLET | Freq: Two times a day (BID) | ORAL | 5 refills | Status: DC

## 2017-07-03 MED ORDER — BUTALBITAL-APAP-CAFFEINE 50-325-40 MG PO TABS: 1.0000 | ORAL_TABLET | Freq: Three times a day (TID) | ORAL | 5 refills | Status: DC

## 2017-07-03 MED ORDER — BUSPIRONE HCL 15 MG PO TABS: 15.0000 mg | ORAL_TABLET | Freq: Three times a day (TID) | ORAL | 5 refills | Status: DC

## 2017-07-03 MED ORDER — ALPRAZOLAM 0.5 MG PO TABS: 0.5000 mg | ORAL_TABLET | Freq: Two times a day (BID) | ORAL | 1 refills | Status: DC | PRN

## 2017-07-03 MED ORDER — CITALOPRAM HYDROBROMIDE 40 MG PO TABS: 40.0000 mg | ORAL_TABLET | Freq: Every day | ORAL | 3 refills | Status: DC

## 2017-07-03 NOTE — Patient Instructions (Signed)
In a few days you may receive a survey in the mail or online from [employer] regarding your visit with us today. Please take a moment to fill this out. Your feedback is very important to our whole office. It can help us better understand your needs as well as improve your experience and satisfaction. Thank you for taking your time to complete it. We care about you.  Harbour Nordmeyer, PA-C  

## 2017-07-03 NOTE — Progress Notes (Signed)
BP 124/80   Pulse 72   Temp (!) 97.5 F (36.4 C) (Oral)   Ht 4\' 9"  (1.448 m)   Wt 114 lb 3.2 oz (51.8 kg)   BMI 24.71 kg/m    Subjective:    Patient ID: Tracy Jensen, female    DOB: 05-04-1956, 63 y.o.   MRN: [id number]  HPI: Tracy Jensen is a 62 y.o. female presenting on 07/03/2017 for Follow-up (discuss medication )  This patient comes in for periodic recheck on medications and conditions including migraine, depression, anxiety, sexual dysfunction.  Overall she is doing well with that these conditions.  This patient has had many days of sinus headache and postnasal drainage. There is copious drainage at times. Denies any fever at this time. There has been a history of sinus infections in the past.  No history of sinus surgery. There is cough at night. It has become more prevalent in recent days. .   All medications are reviewed today. There are no reports of any problems with the medications. All of the medical conditions are reviewed and updated.  Lab work is reviewed and will be ordered as medically necessary. There are no new problems reported with today's visit.   Relevant past medical, surgical, family and social history reviewed and updated as indicated. Allergies and medications reviewed and updated.  Past Medical History:  Diagnosis Date  . Hypertension   . Migraines   . Sciatica   . TIA (transient ischemic attack)     History reviewed. No pertinent surgical history.  Review of Systems  Constitutional: Positive for chills and fatigue. Negative for activity change and appetite change.  HENT: Positive for congestion, postnasal drip and sore throat.   Eyes: Negative.   Respiratory: Positive for cough and wheezing.   Cardiovascular: Negative.  Negative for chest pain, palpitations and leg swelling.  Gastrointestinal: Negative.   Genitourinary: Negative.   Musculoskeletal: Negative.   Skin: Negative.   Neurological: Positive for headaches.    Allergies as of  07/03/2017      Reactions   Azithromycin Hives   Hives after Zpack      Medication List        Accurate as of 07/03/17  2:05 PM. Always use your most recent med list.          ALPRAZolam 0.5 MG tablet Commonly known as:  XANAX Take 1 tablet (0.5 mg total) by mouth 2 (two) times daily as needed.   amoxicillin-clavulanate 875-125 MG tablet Commonly known as:  AUGMENTIN Take 1 tablet by mouth 2 (two) times daily.   aspirin EC 81 MG tablet Take 81 mg by mouth daily.   busPIRone 15 MG tablet Commonly known as:  BUSPAR Take 1 tablet (15 mg total) by mouth 3 (three) times daily.   butalbital-acetaminophen-caffeine 50-325-40 MG tablet Commonly known as:  FIORICET, ESGIC Take 1 tablet by mouth every 8 (eight) hours.   citalopram 40 MG tablet Commonly known as:  CELEXA Take 1 tablet (40 mg total) by mouth daily.   cyclobenzaprine 10 MG tablet Commonly known as:  FLEXERIL TAKE ONE TABLET BY MOUTH THREE TIMES DAILY   diclofenac 75 MG EC tablet Commonly known as:  VOLTAREN Take 1 tablet (75 mg total) by mouth 2 (two) times daily.   fluticasone 50 MCG/ACT nasal spray Commonly known as:  FLONASE Place 2 sprays into both nostrils daily.   HYDROcodone-homatropine 5-1.5 MG/5ML syrup Commonly known as:  HYCODAN Take 5 mLs by mouth every 6 (  six) hours as needed for cough.   lisinopril 20 MG tablet Commonly known as:  PRINIVIL,ZESTRIL Take 1 tablet (20 mg total) by mouth daily.   pseudoephedrine-guaifenesin 60-600 MG 12 hr tablet Commonly known as:  MUCINEX D Take 1 tablet by mouth every 12 (twelve) hours for 10 days. As needed for congestion   sildenafil 20 MG tablet Commonly known as:  REVATIO TAKE 1 TO 2&1/2 TABLETS DAILY   traZODone 50 MG tablet Commonly known as:  DESYREL TAKE TWO TABLETS BY MOUTH AT BEDTIME          Objective:    BP 124/80   Pulse 72   Temp (!) 97.5 F (36.4 C) (Oral)   Ht 4\' 9"  (1.448 m)   Wt 114 lb 3.2 oz (51.8 kg)   BMI 24.71 kg/m     Allergies  Allergen Reactions  . Azithromycin Hives    Hives after Zpack    Physical Exam  Constitutional: She is oriented to person, place, and time. She appears well-developed and well-nourished.  HENT:  Head: Normocephalic and atraumatic.  Right Ear: Tympanic membrane and external ear normal. No middle ear effusion.  Left Ear: Tympanic membrane and external ear normal.  No middle ear effusion.  Nose: Mucosal edema and rhinorrhea present. Right sinus exhibits no maxillary sinus tenderness. Left sinus exhibits no maxillary sinus tenderness.  Mouth/Throat: Uvula is midline. Posterior oropharyngeal erythema present.  Eyes: Conjunctivae and EOM are normal. Pupils are equal, round, and reactive to light. Right eye exhibits no discharge. Left eye exhibits no discharge.  Neck: Normal range of motion.  Cardiovascular: Normal rate, regular rhythm and normal heart sounds.  Pulmonary/Chest: Effort normal and breath sounds normal. No respiratory distress. She has no wheezes.  Abdominal: Soft.  Lymphadenopathy:    She has no cervical adenopathy.  Neurological: She is alert and oriented to person, place, and time.  Skin: Skin is warm and dry.  Psychiatric: She has a normal mood and affect.  Nursing note and vitals reviewed.       Assessment & Plan:   1. Other migraine without status migrainosus, not intractable - butalbital-acetaminophen-caffeine (FIORICET, ESGIC) 50-325-40 MG tablet; Take 1 tablet by mouth every 8 (eight) hours.  Dispense: 10 tablet; Refill: 5  2. Depression, unspecified depression ty - citalopram (CELEXA) 40 MG tablet; Take 1 tablet (40 mg total) by mouth daily.  Dispense: 90 tablet; Refill: 3  3. Sexual dysfunction - sildenafil (REVATIO) 20 MG tablet; TAKE 1 TO 2&1/2 TABLETS DAILY  Dispense: 30 tablet; Refill: 5  4. Acute recurrent frontal sinusitis - amoxicillin-clavulanate (AUGMENTIN) 875-125 MG tablet; Take 1 tablet by mouth 2 (two) times daily.  Dispense: 20  tablet; Refill: 0  5. GAD (generalized anxiety disorder) - ALPRAZolam (XANAX) 0.5 MG tablet; Take 1 tablet (0.5 mg total) by mouth 2 (two) times daily as needed.  Dispense: 60 tablet; Refill: 1 - busPIRone (BUSPAR) 15 MG tablet; Take 1 tablet (15 mg total) by mouth 3 (three) times daily.  Dispense: 90 tablet; Refill: 5    Current Outpatient Medications:  .  ALPRAZolam (XANAX) 0.5 MG tablet, Take 1 tablet (0.5 mg total) by mouth 2 (two) times daily as needed., Disp: 60 tablet, Rfl: 1 .  amoxicillin-clavulanate (AUGMENTIN) 875-125 MG tablet, Take 1 tablet by mouth 2 (two) times daily., Disp: 20 tablet, Rfl: 0 .  aspirin EC 81 MG tablet, Take 81 mg by mouth daily., Disp: , Rfl:  .  busPIRone (BUSPAR) 15 MG tablet, Take 1 tablet (  15 mg total) by mouth 3 (three) times daily., Disp: 90 tablet, Rfl: 5 .  butalbital-acetaminophen-caffeine (FIORICET, ESGIC) 50-325-40 MG tablet, Take 1 tablet by mouth every 8 (eight) hours., Disp: 10 tablet, Rfl: 5 .  citalopram (CELEXA) 40 MG tablet, Take 1 tablet (40 mg total) by mouth daily., Disp: 90 tablet, Rfl: 3 .  cyclobenzaprine (FLEXERIL) 10 MG tablet, TAKE ONE TABLET BY MOUTH THREE TIMES DAILY, Disp: 90 tablet, Rfl: 5 .  diclofenac (VOLTAREN) 75 MG EC tablet, Take 1 tablet (75 mg total) by mouth 2 (two) times daily., Disp: 60 tablet, Rfl: 5 .  fluticasone (FLONASE) 50 MCG/ACT nasal spray, Place 2 sprays into both nostrils daily., Disp: 16 g, Rfl: 6 .  HYDROcodone-homatropine (HYCODAN) 5-1.5 MG/5ML syrup, Take 5 mLs by mouth every 6 (six) hours as needed for cough., Disp: 120 mL, Rfl: 0 .  lisinopril (PRINIVIL,ZESTRIL) 20 MG tablet, Take 1 tablet (20 mg total) by mouth daily., Disp: 90 tablet, Rfl: 3 .  pseudoephedrine-guaifenesin (MUCINEX D) 60-600 MG 12 hr tablet, Take 1 tablet by mouth every 12 (twelve) hours for 10 days. As needed for congestion, Disp: 20 tablet, Rfl: 0 .  sildenafil (REVATIO) 20 MG tablet, TAKE 1 TO 2&1/2 TABLETS DAILY, Disp: 30 tablet,  Rfl: 5 .  traZODone (DESYREL) 50 MG tablet, TAKE TWO TABLETS BY MOUTH AT BEDTIME, Disp: 60 tablet, Rfl: 11 Continue all other maintenance medications as listed above.  Follow up plan: Return in about 6 months (around 12/31/2017) for recheck.  Educational handout given for suvey  Remus LofflerAngel S. Theordore Cisnero PA-C [facility] [address]  [place], [place] [address] [phone]   07/03/2017, 2:05 PM

## 2017-08-11 DIAGNOSIS — M5432 Sciatica, left side: Secondary | ICD-10-CM | POA: Diagnosis not present

## 2017-08-13 ENCOUNTER — Encounter: Payer: Self-pay | Admitting: Family Medicine

## 2017-08-13 ENCOUNTER — Ambulatory Visit (INDEPENDENT_AMBULATORY_CARE_PROVIDER_SITE_OTHER): Payer: 59 | Admitting: Family Medicine

## 2017-08-13 VITALS — BP 111/80 | HR 71 | Temp 97.1°F | Ht <= 58 in | Wt 116.8 lb

## 2017-08-13 DIAGNOSIS — G5702 Lesion of sciatic nerve, left lower limb: Secondary | ICD-10-CM

## 2017-08-13 MED ORDER — METHYLPREDNISOLONE ACETATE 80 MG/ML IJ SUSP
80.0000 mg | Freq: Once | INTRAMUSCULAR | Status: AC
  Administered 2017-08-13: 80 mg via INTRAMUSCULAR

## 2017-08-13 MED ORDER — PREDNISONE 20 MG PO TABS: ORAL_TABLET | ORAL | 0 refills | Status: DC

## 2017-08-13 NOTE — Progress Notes (Signed)
   HPI  Patient presents today with left hip pain.  Patient explains that for the last 2-4 weeks she has had left buttock pain that is getting worse over the last week.  She states it is buttock pain that radiates down to her left popliteal fossa. It hurts with using it and also hurts at night whenever.  She states that it does not bother her when she lies down at night  She compares it to a previous episode of sciatica on the right side that she had.  She would like a cortisone injection and prednisone.  PMH: Smoking status noted ROS: Per HPI  Objective: BP 111/80   Pulse 71   Temp (!) 97.1 F (36.2 C) (Oral)   Ht 4\' 9"  (1.448 m)   Wt 116 lb 12.8 oz (53 kg)   BMI 25.28 kg/m  Gen: NAD, alert, cooperative with exam HEENT: NCAT CV: RRR, good S1/S2, no murmur Resp: CTABL, no wheezes, non-labored  Ext: No edema, warm Neuro: Alert and oriented, No gross deficits  MSK:  L low back with mild TTP, no midline ttp  Assessment and plan:  # L sided piriformis syndrome IM depo medrol, prdnisone Stretches, discussed usual course.    Meds ordered this encounter  Medications  . methylPREDNISolone acetate (DEPO-MEDROL) injection 80 mg  . predniSONE (DELTASONE) 20 MG tablet    Sig: 2 po at same time daily for 5 days    Dispense:  10 tablet    Refill:  0    Murtis SinkSam Bradshaw, MD [facility] 08/13/2017, 3:15 PM

## 2017-08-31 ENCOUNTER — Other Ambulatory Visit: Payer: Self-pay | Admitting: Physician Assistant

## 2017-08-31 DIAGNOSIS — F411 Generalized anxiety disorder: Secondary | ICD-10-CM

## 2017-10-10 ENCOUNTER — Ambulatory Visit (INDEPENDENT_AMBULATORY_CARE_PROVIDER_SITE_OTHER): Payer: 59 | Admitting: Family Medicine

## 2017-10-10 ENCOUNTER — Encounter: Payer: Self-pay | Admitting: Family Medicine

## 2017-10-10 VITALS — BP 113/73 | HR 76 | Temp 98.9°F | Ht <= 58 in | Wt 116.6 lb

## 2017-10-10 DIAGNOSIS — M5441 Lumbago with sciatica, right side: Secondary | ICD-10-CM | POA: Diagnosis not present

## 2017-10-10 DIAGNOSIS — G8929 Other chronic pain: Secondary | ICD-10-CM | POA: Diagnosis not present

## 2017-10-10 MED ORDER — PREDNISONE 10 MG (21) PO TBPK: ORAL_TABLET | ORAL | 0 refills | Status: DC

## 2017-10-10 MED ORDER — METHOCARBAMOL 500 MG PO TABS: 500.0000 mg | ORAL_TABLET | Freq: Three times a day (TID) | ORAL | 0 refills | Status: DC | PRN

## 2017-10-10 NOTE — Progress Notes (Signed)
Subjective: CC: Right hip pain PCP: Tracy Loffler, PA-C UJW:[id number] H Jensen is a 62 y.o. female presenting to clinic today for:  1. Right hip pain Patient reports that she has had chronic right-sided hip pain/sciatica.  She was actually seen at the end of February for similar.  This was thought to be piriformis syndrome.  She was treated with a dose of Depo-Medrol 80 and several days of an oral corticosteroid.  She notes that this did relieve pain but that it has since recurred.  She has been using Flexeril with little improvement in symptoms.  She denies preceding injury.  She notes that the pain radiates down the right leg and that she feels weak in that right lower extremity.  No saddle anesthesia, fecal incontinence or urinary retention.    ROS: Per HPI  Allergies  Allergen Reactions  . Azithromycin Hives    Hives after Zpack   Past Medical History:  Diagnosis Date  . Hypertension   . Migraines   . Sciatica   . TIA (transient ischemic attack)     Current Outpatient Medications:  .  ALPRAZolam (XANAX) 0.5 MG tablet, TAKE 1 TABLET BY MOUTH TWICE DAILY AS NEEDED, Disp: 60 tablet, Rfl: 2 .  aspirin EC 81 MG tablet, Take 81 mg by mouth daily., Disp: , Rfl:  .  busPIRone (BUSPAR) 15 MG tablet, Take 1 tablet (15 mg total) by mouth 3 (three) times daily., Disp: 90 tablet, Rfl: 5 .  butalbital-acetaminophen-caffeine (FIORICET, ESGIC) 50-325-40 MG tablet, Take 1 tablet by mouth every 8 (eight) hours., Disp: 10 tablet, Rfl: 5 .  citalopram (CELEXA) 40 MG tablet, Take 1 tablet (40 mg total) by mouth daily., Disp: 90 tablet, Rfl: 3 .  cyclobenzaprine (FLEXERIL) 10 MG tablet, TAKE ONE TABLET BY MOUTH THREE TIMES DAILY, Disp: 90 tablet, Rfl: 5 .  diclofenac (VOLTAREN) 75 MG EC tablet, Take 1 tablet (75 mg total) by mouth 2 (two) times daily., Disp: 60 tablet, Rfl: 5 .  fluticasone (FLONASE) 50 MCG/ACT nasal spray, Place 2 sprays into both nostrils daily., Disp: 16 g, Rfl: 6 .  lisinopril  (PRINIVIL,ZESTRIL) 20 MG tablet, Take 1 tablet (20 mg total) by mouth daily., Disp: 90 tablet, Rfl: 3 .  sildenafil (REVATIO) 20 MG tablet, TAKE 1 TO 2&1/2 TABLETS DAILY, Disp: 30 tablet, Rfl: 5 .  traZODone (DESYREL) 50 MG tablet, TAKE TWO TABLETS BY MOUTH AT BEDTIME, Disp: 60 tablet, Rfl: 11 Social History   Socioeconomic History  . Marital status: Single    Spouse name: Not on file  . Number of children: Not on file  . Years of education: Not on file  . Highest education level: Not on file  Occupational History  . Not on file  Social Needs  . Financial resource strain: Not on file  . Food insecurity:    Worry: Not on file    Inability: Not on file  . Transportation needs:    Medical: Not on file    Non-medical: Not on file  Tobacco Use  . Smoking status: Never Smoker  . Smokeless tobacco: Never Used  Substance and Sexual Activity  . Alcohol use: Yes    Comment: occ  . Drug use: No  . Sexual activity: Not on file  Lifestyle  . Physical activity:    Days per week: Not on file    Minutes per session: Not on file  . Stress: Not on file  Relationships  . Social connections:    Talks  on phone: Not on file    Gets together: Not on file    Attends religious service: Not on file    Active member of club or organization: Not on file    Attends meetings of clubs or organizations: Not on file    Relationship status: Not on file  . Intimate partner violence:    Fear of current or ex partner: Not on file    Emotionally abused: Not on file    Physically abused: Not on file    Forced sexual activity: Not on file  Other Topics Concern  . Not on file  Social History Narrative  . Not on file   History reviewed. No pertinent family history.  Objective: Office vital signs reviewed. BP 113/73   Pulse 76   Temp 98.9 F (37.2 C) (Oral)   Ht  (1.448 m)   Wt 116 lb 9.6 oz (52.9 kg)   BMI 25.23 kg/m   Physical Examination:  General: Awake, alert, well nourished, No  acute distress MSK: normal gait and normal station  Lumbar spine: She has some reduction in active range of motion in flexion and extension.  No midline tenderness to palpation.  She does have paraspinal muscle tenderness palpation, on the right side, particularly over the lumbosacral junction and along the SI joint.  No palpable bony abnormalities.  She has some pain on the right with straight leg raise.  Hips: Negative FADIR/ FABER. Neuro: 4/5 LE Strength bilaterally and light touch sensation grossly intact, normal heel and toe walks.  Assessment/ Plan: 62 y.o. female   1. Chronic right-sided low back pain with right-sided sciatica We discussed that we should at minimum consider imaging of her lumbar spine given the chronicity of this issue.  She will return during the week to have this done.  I do suspect that she has irritation of the nerves in the lower lumbar spine and likely degenerative changes.  She was given a prednisone Dosepak to take.  We did discuss the risks of repeated steroid use.  She was good understanding of this.  I have also added methocarbamol since this is helped her pain in the past.  She is going to discontinue the cyclobenzaprine.  Home care instructions were reviewed with the patient.  Handout provided.  Follow-up with PCP as needed. - DG Lumbar Spine 2-3 Views; Future   Orders Placed This Encounter  Procedures  . DG Lumbar Spine 2-3 Views    Standing Status:   Future    Standing Expiration Date:   12/10/2018    Order Specific Question:   Reason for Exam (SYMPTOM  OR DIAGNOSIS REQUIRED)    Answer:   back pain    Order Specific Question:   Preferred imaging location?    Answer:   Internal   Meds ordered this encounter  Medications  . predniSONE (STERAPRED UNI-PAK 21 TAB) 10 MG (21) TBPK tablet    Sig: As directed x 6 days    Dispense:  21 tablet    Refill:  0  . methocarbamol (ROBAXIN) 500 MG tablet    Sig: Take 1 tablet (500 mg total) by mouth every 8 (eight)  hours as needed for muscle spasms.    Dispense:  20 tablet    Refill:  0     Tracy Bogden Hulen Skains, DO [facility] [phone]

## 2017-10-13 DIAGNOSIS — M47816 Spondylosis without myelopathy or radiculopathy, lumbar region: Secondary | ICD-10-CM | POA: Diagnosis not present

## 2017-10-13 DIAGNOSIS — M5442 Lumbago with sciatica, left side: Secondary | ICD-10-CM | POA: Diagnosis not present

## 2017-10-14 DIAGNOSIS — M47816 Spondylosis without myelopathy or radiculopathy, lumbar region: Secondary | ICD-10-CM | POA: Diagnosis not present

## 2017-10-14 DIAGNOSIS — M5442 Lumbago with sciatica, left side: Secondary | ICD-10-CM | POA: Diagnosis not present

## 2017-10-26 ENCOUNTER — Telehealth: Payer: Self-pay | Admitting: Physician Assistant

## 2017-10-26 ENCOUNTER — Other Ambulatory Visit: Payer: Self-pay | Admitting: Physician Assistant

## 2017-10-26 NOTE — Telephone Encounter (Signed)
Sent the [employer]

## 2017-10-26 NOTE — Telephone Encounter (Signed)
Pt aware - sent in  

## 2017-10-29 ENCOUNTER — Other Ambulatory Visit: Payer: Self-pay | Admitting: *Deleted

## 2017-10-29 DIAGNOSIS — Z23 Encounter for immunization: Secondary | ICD-10-CM

## 2017-10-29 MED ORDER — MEASLES, MUMPS & RUBELLA VAC ~~LOC~~ INJ: 0.5000 mL | INJECTION | Freq: Once | SUBCUTANEOUS | Status: DC

## 2017-10-29 NOTE — Progress Notes (Signed)
Pt requesting RX for MMR vaccine to be given at [facility]

## 2017-12-03 DIAGNOSIS — M47816 Spondylosis without myelopathy or radiculopathy, lumbar region: Secondary | ICD-10-CM | POA: Diagnosis not present

## 2017-12-03 DIAGNOSIS — M5442 Lumbago with sciatica, left side: Secondary | ICD-10-CM | POA: Diagnosis not present

## 2017-12-04 DIAGNOSIS — M5442 Lumbago with sciatica, left side: Secondary | ICD-10-CM | POA: Diagnosis not present

## 2017-12-04 DIAGNOSIS — M47816 Spondylosis without myelopathy or radiculopathy, lumbar region: Secondary | ICD-10-CM | POA: Diagnosis not present

## 2017-12-15 ENCOUNTER — Encounter: Payer: Self-pay | Admitting: Physician Assistant

## 2017-12-15 ENCOUNTER — Ambulatory Visit (INDEPENDENT_AMBULATORY_CARE_PROVIDER_SITE_OTHER): Payer: 59 | Admitting: Physician Assistant

## 2017-12-15 VITALS — BP 136/89 | HR 87 | Temp 98.4°F | Ht <= 58 in | Wt 119.8 lb

## 2017-12-15 DIAGNOSIS — G8929 Other chronic pain: Secondary | ICD-10-CM

## 2017-12-15 DIAGNOSIS — M25551 Pain in right hip: Secondary | ICD-10-CM | POA: Diagnosis not present

## 2017-12-15 MED ORDER — METHOCARBAMOL 500 MG PO TABS: 500.0000 mg | ORAL_TABLET | Freq: Three times a day (TID) | ORAL | 5 refills | Status: DC | PRN

## 2017-12-15 MED ORDER — PHENTERMINE HCL 37.5 MG PO TABS: 37.5000 mg | ORAL_TABLET | Freq: Every day | ORAL | 1 refills | Status: DC

## 2017-12-15 MED ORDER — METHYLPREDNISOLONE ACETATE 80 MG/ML IJ SUSP
80.0000 mg | Freq: Once | INTRAMUSCULAR | Status: AC
  Administered 2017-12-15: 80 mg via INTRAMUSCULAR

## 2017-12-16 NOTE — Progress Notes (Signed)
BP 136/89   Pulse 87   Temp 98.4 F (36.9 C) (Oral)   Ht 4\' 9"  (1.448 m)   Wt 119 lb 12.8 oz (54.3 kg)   BMI 25.92 kg/m    Subjective:    Patient ID: Tracy Jensen, female    DOB: 02-11-1956, 62 y.o.   MRN: [id number]  HPI: Tracy Jensen is a 62 y.o. female presenting on 12/15/2017 for Hip Pain (right) and Leg Pain (right )  This patient comes in for chronic right hip pain that does radiate down her leg.  She does a lot of standing on her job.  She has had problems with it in the past but now has become much more intense and persistent.  It will hurt tremendously on the posterior and lateral area.  There is no swelling or change in the look of her hip and she has not heard any clicking or popping.  She has taken over-the-counter medications without any relief she is currently on diclofenac without much relief.  Past Medical History:  Diagnosis Date  . Hypertension   . Migraines   . Sciatica   . TIA (transient ischemic attack)    Relevant past medical, surgical, family and social history reviewed and updated as indicated. Interim medical history since our last visit reviewed. Allergies and medications reviewed and updated. DATA REVIEWED: CHART IN EPIC  Family History reviewed for pertinent findings.  Review of Systems  Constitutional: Negative.  Negative for activity change, fatigue and fever.  HENT: Negative.   Eyes: Negative.   Respiratory: Negative.  Negative for cough.   Cardiovascular: Negative.  Negative for chest pain.  Gastrointestinal: Negative.  Negative for abdominal pain.  Endocrine: Negative.   Genitourinary: Negative.  Negative for dysuria.  Musculoskeletal: Positive for arthralgias, gait problem and myalgias.  Skin: Negative.     Allergies as of 12/15/2017      Reactions   Azithromycin Hives   Hives after Zpack      Medication List        Accurate as of 12/15/17 11:59 PM. Always use your most recent med list.          ALPRAZolam 0.5 MG  tablet Commonly known as:  XANAX TAKE 1 TABLET BY MOUTH TWICE DAILY AS NEEDED   aspirin EC 81 MG tablet Take 81 mg by mouth daily.   busPIRone 15 MG tablet Commonly known as:  BUSPAR Take 1 tablet (15 mg total) by mouth 3 (three) times daily.   butalbital-acetaminophen-caffeine 50-325-40 MG tablet Commonly known as:  FIORICET, ESGIC Take 1 tablet by mouth every 8 (eight) hours.   cyclobenzaprine 10 MG tablet Commonly known as:  FLEXERIL TAKE ONE TABLET BY MOUTH THREE TIMES DAILY   diclofenac 75 MG EC tablet Commonly known as:  VOLTAREN Take 1 tablet (75 mg total) by mouth 2 (two) times daily.   fluticasone 50 MCG/ACT nasal spray Commonly known as:  FLONASE Place 2 sprays into both nostrils daily.   lisinopril 20 MG tablet Commonly known as:  PRINIVIL,ZESTRIL Take 1 tablet (20 mg total) by mouth daily.   methocarbamol 500 MG tablet Commonly known as:  ROBAXIN Take 1 tablet (500 mg total) by mouth every 8 (eight) hours as needed for muscle spasms.   phentermine 37.5 MG tablet Commonly known as:  ADIPEX-P Take 1 tablet (37.5 mg total) by mouth daily before breakfast.   sildenafil 20 MG tablet Commonly known as:  REVATIO TAKE 1 TO 2&1/2 TABLETS DAILY  traZODone 50 MG tablet Commonly known as:  DESYREL TAKE TWO TABLETS BY MOUTH AT BEDTIME          Objective:    BP 136/89   Pulse 87   Temp 98.4 F (36.9 C) (Oral)   Ht 4\' 9"  (1.448 m)   Wt 119 lb 12.8 oz (54.3 kg)   BMI 25.92 kg/m   Allergies  Allergen Reactions  . Azithromycin Hives    Hives after Zpack    Wt Readings from Last 3 Encounters:  12/15/17 119 lb 12.8 oz (54.3 kg)  10/10/17 116 lb 9.6 oz (52.9 kg)  08/13/17 116 lb 12.8 oz (53 kg)    Physical Exam  Constitutional: She is oriented to person, place, and time. She appears well-developed and well-nourished.  HENT:  Head: Normocephalic and atraumatic.  Eyes: Pupils are equal, round, and reactive to light. Conjunctivae and EOM are normal.   Cardiovascular: Normal rate, regular rhythm, normal heart sounds and intact distal pulses.  Pulmonary/Chest: Effort normal and breath sounds normal.  Abdominal: Soft. Bowel sounds are normal.  Musculoskeletal:       Right hip: She exhibits decreased range of motion and tenderness. She exhibits normal strength.       Legs: Neurological: She is alert and oriented to person, place, and time. She has normal reflexes.  Skin: Skin is warm and dry. No rash noted.  Psychiatric: She has a normal mood and affect. Her behavior is normal. Judgment and thought content normal.        Assessment & Plan:   1. Pain of right hip joint - methylPREDNISolone acetate (DEPO-MEDROL) injection 80 mg - Ambulatory referral to Orthopedic Surgery  2. Chronic pain of right hip - methylPREDNISolone acetate (DEPO-MEDROL) injection 80 mg - Ambulatory referral to Orthopedic Surgery   Continue all other maintenance medications as listed above.  Follow up plan: No follow-ups on file.  Educational handout given for survey  Remus Loffler PA-C [facility] [address]  [place], [place] [address] [phone]   12/16/2017, 4:46 PM

## 2017-12-23 ENCOUNTER — Ambulatory Visit (INDEPENDENT_AMBULATORY_CARE_PROVIDER_SITE_OTHER): Payer: Self-pay

## 2017-12-23 ENCOUNTER — Ambulatory Visit (INDEPENDENT_AMBULATORY_CARE_PROVIDER_SITE_OTHER): Payer: 59 | Admitting: Orthopaedic Surgery

## 2017-12-23 ENCOUNTER — Ambulatory Visit (INDEPENDENT_AMBULATORY_CARE_PROVIDER_SITE_OTHER): Payer: 59

## 2017-12-23 ENCOUNTER — Encounter (INDEPENDENT_AMBULATORY_CARE_PROVIDER_SITE_OTHER): Payer: Self-pay | Admitting: Orthopaedic Surgery

## 2017-12-23 VITALS — BP 109/71 | HR 70 | Ht <= 58 in | Wt 115.0 lb

## 2017-12-23 DIAGNOSIS — M545 Low back pain: Secondary | ICD-10-CM

## 2017-12-23 DIAGNOSIS — G8929 Other chronic pain: Secondary | ICD-10-CM

## 2017-12-23 NOTE — Progress Notes (Signed)
Office Visit Note   Patient: Tracy Jensen           Date of Birth: Apr 13, 1956           MRN: [id number] Visit Date: 12/23/2017              Requested by: Remus Loffler, PA-C [address] [place], [place] [address] PCP: Remus Loffler, PA-C   Assessment & Plan: Visit Diagnoses:  1. Chronic midline low back pain, with sciatica presence unspecified     Plan: Chronic history of low back right buttock and right leg pain after an injury 3 years ago.  Will obtain MRI scan of lumbar spine as I believe that is the etiology. Follow-Up Instructions: Return after MRI L-S spine.   Orders:  Orders Placed This Encounter  Procedures  . MR Lumbar Spine w/o contrast  . XR Pelvis 1-2 Views  . XR Lumbar Spine 2-3 Views   No orders of the defined types were placed in this encounter.     Procedures: No procedures performed   Clinical Data: No additional findings.   Subjective: Chief Complaint  Patient presents with  . New Patient (Initial Visit)    BACK PAIN AFTER PULLING CASE OF WATER L SI JOINT PAIN RADIATES TO LEFT LEG TO MIDCALF FOR 3 YRS GOT WORSE LAST YR.  Tracy Jensen relates initial onset of low back right buttock and lateral right leg pain approximately 3 years ago.  At the time she was working at [employer].  She was pulling a case of water with onset of her pain.  Since that time she has had recurrent pain oftentimes to the point of compromise.  She is had several cortisone injections depending upon her pain.  She is tried Tylenol Flexeril, Robaxin and ice.  She is also been evaluated by the chiropractor.  She notes that some days "are better than others.  She denies any bowel or bladder dysfunction.  She has not had any numbness or tingling.  Denies any pain referable to the left lower extremity.  She has not experienced any groin pain.  HPI  Review of Systems  Constitutional: Positive for fatigue. Negative for fever.  HENT: Negative for ear pain.   Eyes: Negative for pain.    Respiratory: Negative for cough and shortness of breath.   Cardiovascular: Negative for leg swelling.  Gastrointestinal: Negative for constipation and diarrhea.  Genitourinary: Negative for difficulty urinating.  Musculoskeletal: Positive for back pain. Negative for neck pain.  Skin: Negative for rash.  Allergic/Immunologic: Negative for food allergies.  Neurological: Positive for weakness.  Hematological: Bruises/bleeds easily.  Psychiatric/Behavioral: Negative for sleep disturbance.     Objective: Vital Signs: BP 109/71 (BP Location: Left Arm, Patient Position: Sitting, Cuff Size: Normal)   Pulse 70   Ht 4\' 9"  (1.448 m)   Wt 115 lb (52.2 kg)   BMI 24.89 kg/m   Physical Exam  Constitutional: She is oriented to person, place, and time. She appears well-developed and well-nourished.  HENT:  Mouth/Throat: Oropharynx is clear and moist.  Eyes: Pupils are equal, round, and reactive to light. EOM are normal.  Pulmonary/Chest: Effort normal.  Neurological: She is alert and oriented to person, place, and time.  Skin: Skin is warm and dry.  Psychiatric: She has a normal mood and affect. Her behavior is normal.    Ortho Exam awake alert and oriented x3.  Comfortable sitting.  Straight leg raise negative bilaterally.  Leg lengths appear to be  symmetrical.  Painless range of motion of both hips with internal and external rotation even in hyperflexion.  No knee pain.  Motor and sensory exam intact.  Has some mild tenderness in the lateral parasacral region on the right.  No skin changes.  No masses.  No pain over the sacroiliac joints.  No significant pain over either greater trochanter  Specialty Comments:  No specialty comments available.  Imaging: Xr Lumbar Spine 2-3 Views  Result Date: 12/23/2017 Several views of the lumbosacral spine were obtained.  There is abundant bowel gas and stool that obliterates much of the detail.  Some decreased bony demineralization.  No evidence of  listhesis.  No obvious compression of any of the tubal bodies.  Some facet sclerosis at L4-5 and L5-S1.  Normal lumbar lordosis. no significant curvature  Xr Pelvis 1-2 Views  Result Date: 12/23/2017 P the pelvis demonstrated no abnormality about the sacroiliac joints.  Some decreased bony demineralization diffusely.  There is a little offset of the lateral right acetabulum may or may not be projection.  Faint calcification along the lateral hip capsule.  I think the joint space is well-maintained.  No obvious abnormality of the femoral head and some faint sclerosis superiorly beneath that area in the lateral acetabulum.  This may or may not be significant.  Exam was not specific for hip pathology    PMFS History: Patient Active Problem List   Diagnosis Date Noted  . Chronic right-sided low back pain with right-sided sciatica 10/10/2017  . Acute recurrent frontal sinusitis 07/03/2017  . Olecranon bursitis of right elbow 07/14/2016  . Migraine 07/14/2016  . Sexual dysfunction 07/14/2016  . History of CVA (cerebrovascular accident) 04/08/2016  . HTN (hypertension), benign 04/08/2016  . Depression 04/08/2016   Past Medical History:  Diagnosis Date  . Hypertension   . Migraines   . Sciatica   . TIA (transient ischemic attack)     History reviewed. No pertinent family history.  History reviewed. No pertinent surgical history. Social History   Occupational History  . Not on file  Tobacco Use  . Smoking status: Never Smoker  . Smokeless tobacco: Never Used  Substance and Sexual Activity  . Alcohol use: Yes    Comment: occ  . Drug use: No  . Sexual activity: Not on file

## 2017-12-24 ENCOUNTER — Other Ambulatory Visit (INDEPENDENT_AMBULATORY_CARE_PROVIDER_SITE_OTHER): Payer: Self-pay | Admitting: Radiology

## 2017-12-24 DIAGNOSIS — M5442 Lumbago with sciatica, left side: Principal | ICD-10-CM

## 2017-12-24 DIAGNOSIS — G8929 Other chronic pain: Secondary | ICD-10-CM

## 2017-12-25 DIAGNOSIS — M5136 Other intervertebral disc degeneration, lumbar region: Secondary | ICD-10-CM | POA: Diagnosis not present

## 2017-12-25 DIAGNOSIS — M47816 Spondylosis without myelopathy or radiculopathy, lumbar region: Secondary | ICD-10-CM | POA: Diagnosis not present

## 2017-12-25 DIAGNOSIS — M545 Low back pain: Secondary | ICD-10-CM | POA: Diagnosis not present

## 2017-12-25 DIAGNOSIS — M4316 Spondylolisthesis, lumbar region: Secondary | ICD-10-CM | POA: Diagnosis not present

## 2017-12-28 ENCOUNTER — Other Ambulatory Visit: Payer: Self-pay | Admitting: Physician Assistant

## 2017-12-30 ENCOUNTER — Encounter (INDEPENDENT_AMBULATORY_CARE_PROVIDER_SITE_OTHER): Payer: Self-pay | Admitting: Orthopaedic Surgery

## 2017-12-30 ENCOUNTER — Ambulatory Visit (INDEPENDENT_AMBULATORY_CARE_PROVIDER_SITE_OTHER): Payer: 59 | Admitting: Orthopaedic Surgery

## 2017-12-30 VITALS — BP 115/73 | HR 66 | Ht <= 58 in | Wt 115.0 lb

## 2017-12-30 DIAGNOSIS — M5441 Lumbago with sciatica, right side: Secondary | ICD-10-CM

## 2017-12-30 DIAGNOSIS — G8929 Other chronic pain: Secondary | ICD-10-CM | POA: Diagnosis not present

## 2017-12-30 NOTE — Progress Notes (Signed)
Office Visit Note   Patient: Tracy Jensen           Date of Birth: 1956-03-25           MRN: [id number] Visit Date: 12/30/2017              Requested by: Remus LofflerJones, Angel S, PA-C [address] [place], [place] [id number] PCP: Remus LofflerJones, Angel S, PA-C   Assessment & Plan: Visit Diagnoses:  1. Chronic bilateral low back pain with right-sided sciatica     Plan: Right 12th demonstrates mild lumbar spondylosis and degenerative disc disease without impingement to explain the patient's right-sided symptoms.  There is a grade 1 degenerative anterolisthesis of L4 on L5.  Long discussion regarding MRI scan findings.  Would suggest a course of physical therapy.  Has appropriate medicines at home.  Follow-up 6 to 8 weeks  Follow-Up Instructions: Return if symptoms worsen or fail to improve.   Orders:  No orders of the defined types were placed in this encounter.  No orders of the defined types were placed in this encounter.     Procedures: No procedures performed   Clinical Data: No additional findings.   Subjective: Chief Complaint  Patient presents with  . Follow-up    MRI RESULTS  No change in symptoms from last office visit.  Pain is predominant the back with referred discomfort as far distally is just below the right knee.  Again findings as above.  HPI  Review of Systems  Constitutional: Positive for fatigue. Negative for fever.  HENT: Negative for ear pain.   Eyes: Negative for pain.  Respiratory: Negative for cough and shortness of breath.   Cardiovascular: Negative for leg swelling.  Gastrointestinal: Negative for constipation and diarrhea.  Genitourinary: Negative for difficulty urinating.  Musculoskeletal: Positive for back pain. Negative for neck pain.  Skin: Negative for rash.  Allergic/Immunologic: Negative for food allergies.  Neurological: Positive for weakness. Negative for numbness.  Hematological: Bruises/bleeds easily.  Psychiatric/Behavioral: Negative for sleep  disturbance.     Objective: Vital Signs: BP 115/73 (BP Location: Right Arm, Patient Position: Sitting, Cuff Size: Normal)   Pulse 66   Ht 4\' 9"  (1.448 m)   Wt 115 lb (52.2 kg)   BMI 24.89 kg/m   Physical Exam  Constitutional: She is oriented to person, place, and time. She appears well-developed and well-nourished.  HENT:  Mouth/Throat: Oropharynx is clear and moist.  Eyes: Pupils are equal, round, and reactive to light. EOM are normal.  Pulmonary/Chest: Effort normal.  Neurological: She is alert and oriented to person, place, and time.  Skin: Skin is warm and dry.  Psychiatric: She has a normal mood and affect. Her behavior is normal.    Ortho Exam alert and oriented x3.  Straight leg raise negative bilaterally.  Painless range of motion both hips.  No pain over either greater trochanter.  Neurovascular exam intact.  No swelling distally.  No percussible tenderness of lumbar spine or sacroiliac joints.  Specialty Comments:  No specialty comments available.  Imaging: No results found.   PMFS History: Patient Active Problem List   Diagnosis Date Noted  . Chronic right-sided low back pain with right-sided sciatica 10/10/2017  . Acute recurrent frontal sinusitis 07/03/2017  . Olecranon bursitis of right elbow 07/14/2016  . Migraine 07/14/2016  . Sexual dysfunction 07/14/2016  . History of CVA (cerebrovascular accident) 04/08/2016  . HTN (hypertension), benign 04/08/2016  . Depression 04/08/2016   Past Medical History:  Diagnosis Date  .  Hypertension   . Migraines   . Sciatica   . TIA (transient ischemic attack)     History reviewed. No pertinent family history.  History reviewed. No pertinent surgical history. Social History   Occupational History  . Not on file  Tobacco Use  . Smoking status: Never Smoker  . Smokeless tobacco: Never Used  Substance and Sexual Activity  . Alcohol use: Yes    Comment: occ  . Drug use: No  . Sexual activity: Not on file

## 2017-12-31 ENCOUNTER — Other Ambulatory Visit (INDEPENDENT_AMBULATORY_CARE_PROVIDER_SITE_OTHER): Payer: Self-pay | Admitting: Radiology

## 2017-12-31 DIAGNOSIS — G8929 Other chronic pain: Secondary | ICD-10-CM

## 2017-12-31 DIAGNOSIS — M545 Low back pain: Principal | ICD-10-CM

## 2018-01-01 ENCOUNTER — Encounter: Payer: Self-pay | Admitting: Physical Therapy

## 2018-01-01 ENCOUNTER — Ambulatory Visit: Payer: 59 | Attending: Orthopaedic Surgery | Admitting: Physical Therapy

## 2018-01-01 DIAGNOSIS — G8929 Other chronic pain: Secondary | ICD-10-CM | POA: Diagnosis present

## 2018-01-01 DIAGNOSIS — M545 Low back pain: Secondary | ICD-10-CM | POA: Insufficient documentation

## 2018-01-01 DIAGNOSIS — M25551 Pain in right hip: Secondary | ICD-10-CM | POA: Diagnosis present

## 2018-01-01 NOTE — Therapy (Signed)
[facility] [address] [place], [place], [address] Phone: [phone]   Fax:  [phone]  Physical Therapy Evaluation  Patient Details  Name: Tracy Jensen MRN: [id number] Date of Birth: 1955/07/18 Referring Provider: Norlene CampbellPeter Whitfield, MD   Encounter Date: 01/01/2018  PT End of Session - 01/01/18 0904    Visit Number  1    Number of Visits  12    Date for PT Re-Evaluation  02/19/18    PT Start Time  0815    PT Stop Time  0859    PT Time Calculation (min)  44 min    Activity Tolerance  Patient tolerated treatment well    Behavior During Therapy  [facility] for tasks assessed/performed       Past Medical History:  Diagnosis Date  . Hypertension   . Migraines   . Sciatica   . TIA (transient ischemic attack)     History reviewed. No pertinent surgical history.  There were no vitals filed for this visit.   Subjective Assessment - 01/01/18 1012    Subjective  Patient arrives to physical therapy with reports of low back and hip pain that radiates to lateral calf. Patient reports a history of sciatica in 2016 and reports her back and hip pain has exacerbated over the past month. Patient denies numbness and tingling. Patient reported having a steroid shot in right hip the first week of July and stated it help for only a couple of days. Patient reports she has increased difficulty with standing 8 hours for her full-time job at [employer]. Patient reports pain worst is 7/10 and mainly affects right hip than back. Pain at best is a 0/10 with rest and ice. Patient's goals are to reduce pain in order to stand and perform work activities.    Pertinent History  HTN    Limitations  Standing;Walking    Diagnostic tests  x-ray and MRI stated arthritis    Patient Stated Goals  get better to stand 8 hours a day for work    Currently in Pain?  Yes    Pain Score  5     Pain Location  Hip    Pain Orientation  Right    Pain Descriptors / Indicators   Sore;Dull;Aching    Pain Type  Chronic pain    Pain Onset  More than a month ago    Pain Frequency  Intermittent    Aggravating Factors   standing 8 hours at work    Pain Relieving Factors  ice         [facility] - 01/01/18 0001      Assessment   Medical Diagnosis  Chronic midline low back pain, with sciatica    Referring Provider  Norlene CampbellPeter Whitfield, MD    Onset Date/Surgical Date  12/14/17    Next MD Visit  n/a    Prior Therapy  No      Precautions   Precautions  None      Restrictions   Weight Bearing Restrictions  No      Balance Screen   Has the patient fallen in the past 6 months  No    Has the patient had a decrease in activity level because of a fear of falling?   No    Is the patient reluctant to leave their home because of a fear of falling?   No      Home Public house managernvironment   Living Environment  Private residence  Living Arrangements  Spouse/significant other;Children    Type of Home  House      Prior Function   Level of Independence  Independent    Vocation  Full time employment    Vocation Requirements  Stand 8 hours/40 hrs per week      ROM / Strength   AROM / PROM / Strength  AROM;Strength      AROM   Overall AROM Comments  reports of R low back/glute tightness    AROM Assessment Site  Lumbar    Lumbar Flexion  19 in finger tip to floor    Lumbar Extension  limited with knee flexion compensation    Lumbar - Right Side Bend  18 in finger tip to floor    Lumbar - Left Side Bend  19 inches finger tip to floor      Strength   Strength Assessment Site  Hip;Knee    Right/Left Hip  Right;Left    Right Hip Flexion  4-/5    Right Hip Extension  3+/5    Right Hip ABduction  3-/5    Left Hip Flexion  4-/5    Left Hip Extension  4-/5    Left Hip ABduction  4/5    Right/Left Knee  Right;Left    Right Knee Flexion  5/5    Right Knee Extension  5/5    Left Knee Flexion  5/5    Left Knee Extension  5/5      Palpation   Palpation comment  increased  tenderness to palpation to R QL; upper gluteals, lateral hip to mid thigh      Ambulation/Gait   Gait Pattern  Within Functional Limits                Objective measurements completed on examination: See above findings.              PT Education - 01/01/18 1023    Education Details  draw ins, seated marches, LTR, seated fig 4 stretch    Person(s) Educated  Patient    Methods  Handout;Demonstration;Explanation    Comprehension  Verbalized understanding;Returned demonstration       PT Short Term Goals - 01/01/18 1033      PT SHORT TERM GOAL #1   Title  STG=LTG        PT Long Term Goals - 01/01/18 1023      PT LONG TERM GOAL #1   Title  Patient will be independent with HEP    Time  6    Period  Weeks    Status  New      PT LONG TERM GOAL #2   Title  Patient will report no radiating pain down R LE to indicate no nerve irritation.    Time  6    Period  Weeks    Status  New      PT LONG TERM GOAL #3   Title  Patient demonstrated 5/5 LE strength in all planes to improve ability to perform functional activities.      PT LONG TERM GOAL #4   Title  Patient will report ability to stand for work shift with less than 3/10 low back and right hip pain.    Time  6    Status  New      PT LONG TERM GOAL #5   Title  Patient will demonstrate proper lifting body mechanics to protect back while lifting at work and at home.  Time  6    Period  Weeks    Status  New             Plan - 01/01/18 1010    Clinical Impression Statement  Patient is a 62 year old female who presents to physical therapy with reports of low back and right hip pain. Patient noted with decreased lumbar AROM with reports of "tightness in right hip" at end ranges. Patient demonstrates good minus to normal LE strength. Patient is tender to palpation along R QL, upper gluteals, and lateral hip to mid right thigh. Patient with normal sensation. Patient noted with WNL gait pattern. Patient  would benefit from skilled physical therapy to address deficits and address patient's goals.     Clinical Presentation  Stable    Clinical Decision Making  Low    Rehab Potential  Good    PT Frequency  2x / week    PT Duration  6 weeks    PT Next Visit Plan  Nustep, core strengthening, LE strengthening, modalities PRN for pain relief    PT Home Exercise Plan  draw ins, seated marching, lower trunk rotations, seated figure 4 stretch    Consulted and Agree with Plan of Care  Patient       Patient will benefit from skilled therapeutic intervention in order to improve the following deficits and impairments:  Pain, Decreased activity tolerance, Decreased endurance, Decreased strength, Decreased range of motion  Visit Diagnosis: Chronic midline low back pain, with sciatica presence unspecified - Plan: PT plan of care cert/re-cert  Pain in right hip - Plan: PT plan of care cert/re-cert     Problem List Patient Active Problem List   Diagnosis Date Noted  . Chronic right-sided low back pain with right-sided sciatica 10/10/2017  . Acute recurrent frontal sinusitis 07/03/2017  . Olecranon bursitis of right elbow 07/14/2016  . Migraine 07/14/2016  . Sexual dysfunction 07/14/2016  . History of CVA (cerebrovascular accident) 04/08/2016  . HTN (hypertension), benign 04/08/2016  . Depression 04/08/2016   Guss Bunde, PT, DPT 01/01/2018, 10:49 AM  [facility] [address] [place], [place], [address] Phone: [phone]   Fax:  [phone]  Name: Tracy Jensen MRN: [id number] Date of Birth: 07-10-1955

## 2018-01-01 NOTE — Patient Instructions (Signed)
   Hanna Ra, PT, DPT [facility] [address] [place], [place], [address] Phone: [phone]   Fax:  [phone]  

## 2018-01-05 ENCOUNTER — Ambulatory Visit: Payer: 59 | Admitting: Physical Therapy

## 2018-01-05 ENCOUNTER — Encounter: Payer: Self-pay | Admitting: Physical Therapy

## 2018-01-05 DIAGNOSIS — M25551 Pain in right hip: Secondary | ICD-10-CM

## 2018-01-05 DIAGNOSIS — G8929 Other chronic pain: Secondary | ICD-10-CM

## 2018-01-05 DIAGNOSIS — M545 Low back pain: Principal | ICD-10-CM

## 2018-01-05 NOTE — Therapy (Addendum)
[facility] [place], [place], [address] Phone: [phone]   Fax:  [phone]  Physical Therapy Treatment/Discharge   PHYSICAL THERAPY DISCHARGE SUMMARY  Visits from Start of Care: 2  Current functional level related to goals / functional outcomes: See below   Remaining deficits: See goals   Education / Equipment: HEP Plan: Patient agrees to discharge.  Patient goals were not met. Patient is being discharged due to not returning since the last visit.  ?????    [name], PT, DPT 07/08/18   Patient Details  Name: Tracy Jensen MRN: [id number] Date of Birth: Nov 03, 1955 Referring Provider: Joni Fears, MD   Encounter Date: 01/05/2018  PT End of Session - 01/05/18 0830    Visit Number  2    Number of Visits  12    Date for PT Re-Evaluation  02/19/18    Authorization Type  progress note every 10th visit    PT Start Time  0821 late arrival    PT Stop Time  0908    PT Time Calculation (min)  47 min    Activity Tolerance  Patient tolerated treatment well    Behavior During Therapy  [facility] for tasks assessed/performed       Past Medical History:  Diagnosis Date  . Hypertension   . Migraines   . Sciatica   . TIA (transient ischemic attack)     History reviewed. No pertinent surgical history.  There were no vitals filed for this visit.  Subjective Assessment - 01/05/18 0829    Subjective  Patient reports if pain persists she may have to make the decision to stop working at [employer].    Pertinent History  HTN, history of TIA    Limitations  Standing;Walking    Diagnostic tests  x-ray and MRI stated arthritis    Patient Stated Goals  get better to stand 8 hours a day for work    Currently in Pain?  Yes    Pain Score  7     Pain Orientation  Right    Pain Descriptors / Indicators  Aching;Sore    Pain Onset  More than a month ago    Pain Frequency  Intermittent         [facility] PT Assessment -  01/05/18 0001      Assessment   Medical Diagnosis  Chronic midline low back pain, with sciatica                   [facility] Adult PT Treatment/Exercise - 01/05/18 0001      Exercises   Exercises  Lumbar      Lumbar Exercises: Stretches   Lower Trunk Rotation  3 reps;30 seconds      Lumbar Exercises: Aerobic   Nustep  Level 4 x12 minutes, UE and LE      Lumbar Exercises: Supine   Clam  20 reps;2 seconds    Bridge  Compliant;20 reps;2 seconds    Other Supine Lumbar Exercises  hip adduction with ball 5" hold 2x10      Modalities   Modalities  Electrical Stimulation;Cryotherapy      Moist Heat Therapy   Number Minutes Moist Heat  --    Moist Heat Location  --      Cryotherapy   Number Minutes Cryotherapy  15 Minutes    Cryotherapy Location  Lumbar Spine    Type of Cryotherapy  Ice pack      Electrical Stimulation  Electrical Stimulation Location  R low back    Electrical Stimulation Action  IFC    Electrical Stimulation Parameters  80-150 hz x15 min    Electrical Stimulation Goals  Pain               PT Short Term Goals - 01/01/18 1033      PT SHORT TERM GOAL #1   Title  STG=LTG        PT Long Term Goals - 01/01/18 1023      PT LONG TERM GOAL #1   Title  Patient will be independent with HEP    Time  6    Period  Weeks    Status  New      PT LONG TERM GOAL #2   Title  Patient will report no radiating pain down R LE to indicate no nerve irritation.    Time  6    Period  Weeks    Status  New      PT LONG TERM GOAL #3   Title  Patient demonstrated 5/5 LE strength in all planes to improve ability to perform functional activities.      PT LONG TERM GOAL #4   Title  Patient will report ability to stand for work shift with less than 3/10 low back and right hip pain.    Time  6    Status  New      PT LONG TERM GOAL #5   Title  Patient will demonstrate proper lifting body mechanics to protect back while lifting at work and at home.     Time   6    Period  Weeks    Status  New            Plan - 01/05/18 0840    Clinical Impression Statement  Patient was able to tolerate treatment well despite reports of "pulling" right low back/QL after nustep. Patient was able to complete exercises with verbal and tactile cuing for form. No adverse affects upon removal of modalities.     Clinical Presentation  Stable    Clinical Decision Making  Low    Rehab Potential  Good    PT Frequency  2x / week    PT Duration  6 weeks    PT Treatment/Interventions  ADLs/Self Care Home Management;Cryotherapy;Electrical Stimulation;Moist Heat;Traction;Wheelchair mobility training;Neuromuscular re-education;Manual techniques;Passive range of motion;Taping;Ultrasound;Therapeutic activities;Therapeutic exercise;Balance training;Dry needling;Functional mobility training;Stair training;Gait training;Iontophoresis 101m/ml Dexamethasone    PT Next Visit Plan  Nustep, core strengthening, LE strengthening, modalities PRN for pain relief    Consulted and Agree with Plan of Care  Patient       Patient will benefit from skilled therapeutic intervention in order to improve the following deficits and impairments:  Pain, Decreased activity tolerance, Decreased endurance, Decreased strength, Decreased range of motion  Visit Diagnosis: Chronic midline low back pain, with sciatica presence unspecified  Pain in right hip     Problem List Patient Active Problem List   Diagnosis Date Noted  . Chronic right-sided low back pain with right-sided sciatica 10/10/2017  . Acute recurrent frontal sinusitis 07/03/2017  . Olecranon bursitis of right elbow 07/14/2016  . Migraine 07/14/2016  . Sexual dysfunction 07/14/2016  . History of CVA (cerebrovascular accident) 04/08/2016  . HTN (hypertension), benign 04/08/2016  . Depression 04/08/2016   KGabriela Eves PT, DPT 01/05/2018, 10:30 AM  [facility] [address]Phone: [phone]  Fax:  [phone]  Name: Tracy Jensen MRN: [id number] Date of Birth: Oct 13, 1955

## 2018-01-07 ENCOUNTER — Encounter: Payer: 59 | Admitting: Physical Therapy

## 2018-02-12 ENCOUNTER — Other Ambulatory Visit: Payer: Self-pay | Admitting: Physician Assistant

## 2018-02-12 DIAGNOSIS — F411 Generalized anxiety disorder: Secondary | ICD-10-CM

## 2018-02-18 ENCOUNTER — Other Ambulatory Visit: Payer: Self-pay | Admitting: Physician Assistant

## 2018-02-20 ENCOUNTER — Encounter: Payer: Self-pay | Admitting: Physician Assistant

## 2018-02-20 ENCOUNTER — Ambulatory Visit (INDEPENDENT_AMBULATORY_CARE_PROVIDER_SITE_OTHER): Payer: 59 | Admitting: Physician Assistant

## 2018-02-20 VITALS — BP 108/65 | HR 82 | Temp 97.7°F | Ht <= 58 in | Wt 113.0 lb

## 2018-02-20 DIAGNOSIS — M7521 Bicipital tendinitis, right shoulder: Secondary | ICD-10-CM

## 2018-02-20 MED ORDER — METHYLPREDNISOLONE ACETATE 80 MG/ML IJ SUSP
80.0000 mg | Freq: Once | INTRAMUSCULAR | Status: AC
  Administered 2018-02-20: 80 mg via INTRAMUSCULAR

## 2018-02-20 NOTE — Progress Notes (Signed)
BP 108/65   Pulse 82   Temp 97.7 F (36.5 C) (Oral)   Ht 4\' 9"  (1.448 m)   Wt 113 lb (51.3 kg)   BMI 24.45 kg/m     Subjective:    Patient ID: Tracy Jensen, female    DOB: 28-Oct-1955, 62 y.o.   MRN: [id number]  HPI: Tracy Jensen is a 62 y.o. female presenting on 02/20/2018 for Arm Pain (Right upper)  Patient reports that she was doing some steam cleaning with the machine over her head.  The next day she started to have significant pain in the biceps tendon.  She had continued pain over the past few weeks.  It is very painful when she brings her arm up laterally and sometimes when she reaches forward in flexion mode.  She is not really tried any new medications.  She has tried to use heat and ice without much difference.   Past Medical History:  Diagnosis Date  . Hypertension   . Migraines   . Sciatica   . TIA (transient ischemic attack)    Relevant past medical, surgical, family and social history reviewed and updated as indicated. Interim medical history since our last visit reviewed. Allergies and medications reviewed and updated. DATA REVIEWED: CHART IN EPIC  Family History reviewed for pertinent findings.  Review of Systems  Constitutional: Negative.   HENT: Negative.   Eyes: Negative.   Respiratory: Negative.   Gastrointestinal: Negative.   Genitourinary: Negative.   Musculoskeletal: Positive for arthralgias, joint swelling and myalgias.    Allergies as of 02/20/2018      Reactions   Azithromycin Hives   Hives after Zpack      Medication List        Accurate as of 02/20/18 11:21 AM. Always use your most recent med list.          ALPRAZolam 0.5 MG tablet Commonly known as:  XANAX TAKE 1 TABLET BY MOUTH TWICE DAILY AS NEEDED   aspirin EC 81 MG tablet Take 81 mg by mouth daily.   busPIRone 15 MG tablet Commonly known as:  BUSPAR Take 1 tablet (15 mg total) by mouth 3 (three) times daily.   butalbital-acetaminophen-caffeine 50-325-40 MG  tablet Commonly known as:  FIORICET, ESGIC Take 1 tablet by mouth every 8 (eight) hours.   cyclobenzaprine 10 MG tablet Commonly known as:  FLEXERIL TAKE 1 TABLET BY MOUTH THREE TIMES DAILY   diclofenac 75 MG EC tablet Commonly known as:  VOLTAREN TAKE 1 TABLET BY MOUTH TWICE DAILY   fluticasone 50 MCG/ACT nasal spray Commonly known as:  FLONASE Place 2 sprays into both nostrils daily.   lisinopril 20 MG tablet Commonly known as:  PRINIVIL,ZESTRIL Take 1 tablet (20 mg total) by mouth daily.   methocarbamol 500 MG tablet Commonly known as:  ROBAXIN Take 1 tablet (500 mg total) by mouth every 8 (eight) hours as needed for muscle spasms.   phentermine 37.5 MG tablet Commonly known as:  ADIPEX-P Take 1 tablet (37.5 mg total) by mouth daily before breakfast.   sildenafil 20 MG tablet Commonly known as:  REVATIO TAKE 1 TO 2&1/2 TABLETS DAILY   traZODone 50 MG tablet Commonly known as:  DESYREL TAKE TWO TABLETS BY MOUTH AT BEDTIME          Objective:    BP 108/65   Pulse 82   Temp 97.7 F (36.5 C) (Oral)   Ht 4\' 9"  (1.448 m)   Wt 113 lb (  51.3 kg)   BMI 24.45 kg/m    Allergies  Allergen Reactions  . Azithromycin Hives    Hives after Zpack    Wt Readings from Last 3 Encounters:  02/20/18 113 lb (51.3 kg)  12/30/17 115 lb (52.2 kg)  12/23/17 115 lb (52.2 kg)    Physical Exam  Constitutional: She is oriented to person, place, and time. She appears well-developed and well-nourished.  HENT:  Head: Normocephalic and atraumatic.  Eyes: Pupils are equal, round, and reactive to light. Conjunctivae and EOM are normal.  Pulmonary/Chest: Effort normal.  Abdominal: Soft. Bowel sounds are normal.  Musculoskeletal:       Right shoulder: She exhibits tenderness and pain. She exhibits no swelling, no effusion, no crepitus and no deformity.       Arms: Neurological: She is alert and oriented to person, place, and time. She has normal reflexes.  Skin: Skin is warm and  dry. No rash noted.        Assessment & Plan:   1. Biceps tendinitis of right upper extremity - methylPREDNISolone acetate (DEPO-MEDROL) injection 80 mg   Continue all other maintenance medications as listed above.  Follow up plan: Return if symptoms worsen or fail to improve.  Educational handout given for survey  Remus Loffler PA-C [facility] [address]  [place], [place] [address] [phone]   02/20/2018, 11:21 AM

## 2018-02-20 NOTE — Patient Instructions (Addendum)
Shoulder Exercises Ask your health care provider which exercises are safe for you. Do exercises exactly as told by your health care provider and adjust them as directed. It is normal to feel mild stretching, pulling, tightness, or discomfort as you do these exercises, but you should stop right away if you feel sudden pain or your pain gets worse.Do not begin these exercises until told by your health care provider. RANGE OF MOTION EXERCISES These exercises warm up your muscles and joints and improve the movement and flexibility of your shoulder. These exercises also help to relieve pain, numbness, and tingling. These exercises involve stretching your injured shoulder directly. Exercise A: Pendulum  1. Stand near a wall or a surface that you can hold onto for balance. 2. Bend at the waist and let your left / right arm hang straight down. Use your other arm to support you. Keep your back straight and do not lock your knees. 3. Relax your left / right arm and shoulder muscles, and move your hips and your trunk so your left / right arm swings freely. Your arm should swing because of the motion of your body, not because you are using your arm or shoulder muscles. 4. Keep moving your body so your arm swings in the following directions, as told by your health care provider: ? Side to side. ? Forward and backward. ? In clockwise and counterclockwise circles. 5. Continue each motion for __________ seconds, or for as long as told by your health care provider. 6. Slowly return to the starting position. Repeat __________ times. Complete this exercise __________ times a day. Exercise B:Flexion, Standing  1. Stand and hold a broomstick, a cane, or a similar object. Place your hands a little more than shoulder-width apart on the object. Your left / right hand should be palm-up, and your other hand should be palm-down. 2. Keep your elbow straight and keep your shoulder muscles relaxed. Push the stick down with  your healthy arm to raise your left / right arm in front of your body, and then over your head until you feel a stretch in your shoulder. ? Avoid shrugging your shoulder while you raise your arm. Keep your shoulder blade tucked down toward the middle of your back. 3. Hold for __________ seconds. 4. Slowly return to the starting position. Repeat __________ times. Complete this exercise __________ times a day. Exercise C: Abduction, Standing 1. Stand and hold a broomstick, a cane, or a similar object. Place your hands a little more than shoulder-width apart on the object. Your left / right hand should be palm-up, and your other hand should be palm-down. 2. While keeping your elbow straight and your shoulder muscles relaxed, push the stick across your body toward your left / right side. Raise your left / right arm to the side of your body and then over your head until you feel a stretch in your shoulder. ? Do not raise your arm above shoulder height, unless your health care provider tells you to do that. ? Avoid shrugging your shoulder while you raise your arm. Keep your shoulder blade tucked down toward the middle of your back. 3. Hold for __________ seconds. 4. Slowly return to the starting position. Repeat __________ times. Complete this exercise __________ times a day. Exercise D:Internal Rotation  1. Place your left / right hand behind your back, palm-up. 2. Use your other hand to dangle an exercise band, a towel, or a similar object over your shoulder. Grasp the band with   your left / right hand so you are holding onto both ends. 3. Gently pull up on the band until you feel a stretch in the front of your left / right shoulder. ? Avoid shrugging your shoulder while you raise your arm. Keep your shoulder blade tucked down toward the middle of your back. 4. Hold for __________ seconds. 5. Release the stretch by letting go of the band and lowering your hands. Repeat __________ times. Complete  this exercise __________ times a day. STRETCHING EXERCISES These exercises warm up your muscles and joints and improve the movement and flexibility of your shoulder. These exercises also help to relieve pain, numbness, and tingling. These exercises are done using your healthy shoulder to help stretch the muscles of your injured shoulder. Exercise E: Corner Stretch (External Rotation and Abduction)  1. Stand in a doorway with one of your feet slightly in front of the other. This is called a staggered stance. If you cannot reach your forearms to the door frame, stand facing a corner of a room. 2. Choose one of the following positions as told by your health care provider: ? Place your hands and forearms on the door frame above your head. ? Place your hands and forearms on the door frame at the height of your head. ? Place your hands on the door frame at the height of your elbows. 3. Slowly move your weight onto your front foot until you feel a stretch across your chest and in the front of your shoulders. Keep your head and chest upright and keep your abdominal muscles tight. 4. Hold for __________ seconds. 5. To release the stretch, shift your weight to your back foot. Repeat __________ times. Complete this stretch __________ times a day. Exercise F:Extension, Standing 1. Stand and hold a broomstick, a cane, or a similar object behind your back. ? Your hands should be a little wider than shoulder-width apart. ? Your palms should face away from your back. 2. Keeping your elbows straight and keeping your shoulder muscles relaxed, move the stick away from your body until you feel a stretch in your shoulder. ? Avoid shrugging your shoulders while you move the stick. Keep your shoulder blade tucked down toward the middle of your back. 3. Hold for __________ seconds. 4. Slowly return to the starting position. Repeat __________ times. Complete this exercise __________ times a day. STRENGTHENING  EXERCISES These exercises build strength and endurance in your shoulder. Endurance is the ability to use your muscles for a long time, even after they get tired. Exercise G:External Rotation  1. Sit in a stable chair without armrests. 2. Secure an exercise band at elbow height on your left / right side. 3. Place a soft object, such as a folded towel or a small pillow, between your left / right upper arm and your body to move your elbow a few inches away (about 10 cm) from your side. 4. Hold the end of the band so it is tight and there is no slack. 5. Keeping your elbow pressed against the soft object, move your left / right forearm out, away from your abdomen. Keep your body steady so only your forearm moves. 6. Hold for __________ seconds. 7. Slowly return to the starting position. Repeat __________ times. Complete this exercise __________ times a day. Exercise H:Shoulder Abduction  1. Sit in a stable chair without armrests, or stand. 2. Hold a __________ weight in your left / right hand, or hold an exercise band with both hands.   3. Start with your arms straight down and your left / right palm facing in, toward your body. 4. Slowly lift your left / right hand out to your side. Do not lift your hand above shoulder height unless your health care provider tells you that this is safe. ? Keep your arms straight. ? Avoid shrugging your shoulder while you do this movement. Keep your shoulder blade tucked down toward the middle of your back. 5. Hold for __________ seconds. 6. Slowly lower your arm, and return to the starting position. Repeat __________ times. Complete this exercise __________ times a day. Exercise I:Shoulder Extension 1. Sit in a stable chair without armrests, or stand. 2. Secure an exercise band to a stable object in front of you where it is at shoulder height. 3. Hold one end of the exercise band in each hand. Your palms should face each other. 4. Straighten your elbows and  lift your hands up to shoulder height. 5. Step back, away from the secured end of the exercise band, until the band is tight and there is no slack. 6. Squeeze your shoulder blades together as you pull your hands down to the sides of your thighs. Stop when your hands are straight down by your sides. Do not let your hands go behind your body. 7. Hold for __________ seconds. 8. Slowly return to the starting position. Repeat __________ times. Complete this exercise __________ times a day. Exercise J:Standing Shoulder Row 1. Sit in a stable chair without armrests, or stand. 2. Secure an exercise band to a stable object in front of you so it is at waist height. 3. Hold one end of the exercise band in each hand. Your palms should be in a thumbs-up position. 4. Bend each of your elbows to an "L" shape (about 90 degrees) and keep your upper arms at your sides. 5. Step back until the band is tight and there is no slack. 6. Slowly pull your elbows back behind you. 7. Hold for __________ seconds. 8. Slowly return to the starting position. Repeat __________ times. Complete this exercise __________ times a day. Exercise K:Shoulder Press-Ups  1. Sit in a stable chair that has armrests. Sit upright, with your feet flat on the floor. 2. Put your hands on the armrests so your elbows are bent and your fingers are pointing forward. Your hands should be about even with the sides of your body. 3. Push down on the armrests and use your arms to lift yourself off of the chair. Straighten your elbows and lift yourself up as much as you comfortably can. ? Move your shoulder blades down, and avoid letting your shoulders move up toward your ears. ? Keep your feet on the ground. As you get stronger, your feet should support less of your body weight as you lift yourself up. 4. Hold for __________ seconds. 5. Slowly lower yourself back into the chair. Repeat __________ times. Complete this exercise __________ times a  day. Exercise L: Wall Push-Ups  1. Stand so you are facing a stable wall. Your feet should be about one arm-length away from the wall. 2. Lean forward and place your palms on the wall at shoulder height. 3. Keep your feet flat on the floor as you bend your elbows and lean forward toward the wall. 4. Hold for __________ seconds. 5. Straighten your elbows to push yourself back to the starting position. Repeat __________ times. Complete this exercise __________ times a day. This information is not intended to replace advice   given to you by your health care provider. Make sure you discuss any questions you have with your health care provider. Document Released: 04/16/2005 Document Revised: 02/25/2016 Document Reviewed: 02/11/2015 Elsevier Interactive Patient Education  2018 [employer]  

## 2018-03-04 ENCOUNTER — Other Ambulatory Visit: Payer: Self-pay | Admitting: Physician Assistant

## 2018-03-11 DIAGNOSIS — J0100 Acute maxillary sinusitis, unspecified: Secondary | ICD-10-CM | POA: Diagnosis not present

## 2018-03-25 ENCOUNTER — Other Ambulatory Visit: Payer: Self-pay | Admitting: Physician Assistant

## 2018-03-25 DIAGNOSIS — F411 Generalized anxiety disorder: Secondary | ICD-10-CM

## 2018-03-29 ENCOUNTER — Other Ambulatory Visit: Payer: Self-pay | Admitting: Physician Assistant

## 2018-04-21 ENCOUNTER — Encounter: Payer: Self-pay | Admitting: Physician Assistant

## 2018-04-21 ENCOUNTER — Ambulatory Visit (INDEPENDENT_AMBULATORY_CARE_PROVIDER_SITE_OTHER): Payer: 59 | Admitting: Physician Assistant

## 2018-04-21 VITALS — BP 122/73 | HR 74 | Temp 97.5°F | Ht <= 58 in | Wt 110.0 lb

## 2018-04-21 DIAGNOSIS — M25511 Pain in right shoulder: Secondary | ICD-10-CM | POA: Diagnosis not present

## 2018-04-21 DIAGNOSIS — G8929 Other chronic pain: Secondary | ICD-10-CM | POA: Diagnosis not present

## 2018-04-21 MED ORDER — PREDNISONE 10 MG (21) PO TBPK: ORAL_TABLET | ORAL | 0 refills | Status: DC

## 2018-04-21 MED ORDER — TRAMADOL HCL 50 MG PO TABS: 50.0000 mg | ORAL_TABLET | Freq: Four times a day (QID) | ORAL | 0 refills | Status: DC | PRN

## 2018-04-22 NOTE — Progress Notes (Signed)
BP 122/73   Pulse 74   Temp (!) 97.5 F (36.4 C) (Oral)   Ht 4\' 9"  (1.448 m)   Wt 110 lb (49.9 kg)   BMI 23.80 kg/m    Subjective:    Patient ID: Tracy Jensen, female    DOB: February 04, 1956, 62 y.o.   MRN: [id number]  HPI: Tracy Jensen is a 62 y.o. female presenting on 04/21/2018 for right arm and shoulder pain  Patient comes in for recurrent right arm and shoulder pain.  This is gone on for several years now.  Will flareup and then sometimes come down.  She has tried to do exercises and physical therapy with it.  However at this time it is really flared up.  She does work a Clinical biochemist at [employer].  So she is quite busy with stocking and working.  She denies any new injury.  She would like to have an orthopedic referral.  Past Medical History:  Diagnosis Date  . Hypertension   . Migraines   . Sciatica   . TIA (transient ischemic attack)    Relevant past medical, surgical, family and social history reviewed and updated as indicated. Interim medical history since our last visit reviewed. Allergies and medications reviewed and updated. DATA REVIEWED: CHART IN EPIC  Family History reviewed for pertinent findings.  Review of Systems  Constitutional: Negative.   HENT: Negative.   Eyes: Negative.   Respiratory: Negative.   Gastrointestinal: Negative.   Genitourinary: Negative.   Musculoskeletal: Positive for arthralgias, joint swelling and myalgias.    Allergies as of 04/21/2018      Reactions   Azithromycin Hives   Hives after Zpack      Medication List        Accurate as of 04/21/18 11:59 PM. Always use your most recent med list.          ALPRAZolam 0.5 MG tablet Commonly known as:  XANAX TAKE 1 TABLET BY MOUTH TWICE DAILY AS NEEDED   aspirin EC 81 MG tablet Take 81 mg by mouth daily.   busPIRone 15 MG tablet Commonly known as:  BUSPAR TAKE 1 TABLET BY MOUTH THREE TIMES DAILY   butalbital-acetaminophen-caffeine 50-325-40 MG tablet Commonly known as:   FIORICET, ESGIC Take 1 tablet by mouth every 8 (eight) hours.   cyclobenzaprine 10 MG tablet Commonly known as:  FLEXERIL TAKE 1 TABLET BY MOUTH THREE TIMES DAILY   diclofenac 75 MG EC tablet Commonly known as:  VOLTAREN TAKE 1 TABLET BY MOUTH TWICE DAILY   fluticasone 50 MCG/ACT nasal spray Commonly known as:  FLONASE Place 2 sprays into both nostrils daily.   lisinopril 20 MG tablet Commonly known as:  PRINIVIL,ZESTRIL TAKE 1 TABLET BY MOUTH ONCE DAILY   methocarbamol 500 MG tablet Commonly known as:  ROBAXIN Take 1 tablet (500 mg total) by mouth every 8 (eight) hours as needed for muscle spasms.   predniSONE 10 MG (21) Tbpk tablet Commonly known as:  STERAPRED UNI-PAK 21 TAB As directed x 6 days   sildenafil 20 MG tablet Commonly known as:  REVATIO TAKE 1 TO 2&1/2 TABLETS DAILY   traMADol 50 MG tablet Commonly known as:  ULTRAM Take 1 tablet (50 mg total) by mouth every 6 (six) hours as needed.   traZODone 50 MG tablet Commonly known as:  DESYREL TAKE TWO TABLETS BY MOUTH AT BEDTIME          Objective:    BP 122/73   Pulse 74  Temp (!) 97.5 F (36.4 C) (Oral)   Ht 4\' 9"  (1.448 m)   Wt 110 lb (49.9 kg)   BMI 23.80 kg/m   Allergies  Allergen Reactions  . Azithromycin Hives    Hives after Zpack    Wt Readings from Last 3 Encounters:  04/21/18 110 lb (49.9 kg)  02/20/18 113 lb (51.3 kg)  12/30/17 115 lb (52.2 kg)    Physical Exam  Constitutional: She is oriented to person, place, and time. She appears well-developed and well-nourished.  HENT:  Head: Normocephalic and atraumatic.  Eyes: Pupils are equal, round, and reactive to light. Conjunctivae and EOM are normal.  Cardiovascular: Normal rate, regular rhythm, normal heart sounds and intact distal pulses.  Pulmonary/Chest: Effort normal and breath sounds normal.  Abdominal: Soft. Bowel sounds are normal.  Musculoskeletal:       Right shoulder: She exhibits decreased range of motion, tenderness,  pain and spasm.       Arms: Neurological: She is alert and oriented to person, place, and time. She has normal reflexes.  Skin: Skin is warm and dry. No rash noted.  Psychiatric: She has a normal mood and affect. Her behavior is normal. Judgment and thought content normal.        Assessment & Plan:   1. Chronic right shoulder pain - traMADol (ULTRAM) 50 MG tablet; Take 1 tablet (50 mg total) by mouth every 6 (six) hours as needed.  Dispense: 30 tablet; Refill: 0 - Ambulatory referral to Orthopedic Surgery - predniSONE (STERAPRED UNI-PAK 21 TAB) 10 MG (21) TBPK tablet; As directed x 6 days  Dispense: 21 tablet; Refill: 0   Continue all other maintenance medications as listed above.  Follow up plan: No follow-ups on file.  Educational handout given for survey  Remus Loffler PA-C [facility] [address]  [place], [place] [address] [phone]   04/22/2018, 1:13 PM

## 2018-04-30 ENCOUNTER — Other Ambulatory Visit: Payer: Self-pay | Admitting: Physician Assistant

## 2018-04-30 ENCOUNTER — Ambulatory Visit (INDEPENDENT_AMBULATORY_CARE_PROVIDER_SITE_OTHER): Payer: 59 | Admitting: Orthopaedic Surgery

## 2018-04-30 DIAGNOSIS — G43809 Other migraine, not intractable, without status migrainosus: Secondary | ICD-10-CM

## 2018-05-07 ENCOUNTER — Ambulatory Visit (INDEPENDENT_AMBULATORY_CARE_PROVIDER_SITE_OTHER): Payer: 59 | Admitting: Orthopaedic Surgery

## 2018-05-07 ENCOUNTER — Ambulatory Visit (INDEPENDENT_AMBULATORY_CARE_PROVIDER_SITE_OTHER): Payer: Self-pay

## 2018-05-07 ENCOUNTER — Encounter (INDEPENDENT_AMBULATORY_CARE_PROVIDER_SITE_OTHER): Payer: Self-pay | Admitting: Orthopaedic Surgery

## 2018-05-07 VITALS — BP 105/77 | HR 71 | Ht <= 58 in | Wt 112.0 lb

## 2018-05-07 DIAGNOSIS — G8929 Other chronic pain: Secondary | ICD-10-CM | POA: Diagnosis not present

## 2018-05-07 DIAGNOSIS — M25511 Pain in right shoulder: Secondary | ICD-10-CM | POA: Diagnosis not present

## 2018-05-07 MED ORDER — METHYLPREDNISOLONE ACETATE 40 MG/ML IJ SUSP
80.0000 mg | INTRAMUSCULAR | Status: AC | PRN
  Administered 2018-05-07: 80 mg

## 2018-05-07 MED ORDER — LIDOCAINE HCL 2 % IJ SOLN
2.0000 mL | INTRAMUSCULAR | Status: AC | PRN
  Administered 2018-05-07: 2 mL

## 2018-05-07 MED ORDER — BUPIVACAINE HCL 0.5 % IJ SOLN
2.0000 mL | INTRAMUSCULAR | Status: AC | PRN
  Administered 2018-05-07: 2 mL via INTRA_ARTICULAR

## 2018-05-07 NOTE — Progress Notes (Signed)
Office Visit Note   Patient: Tracy Jensen           Date of Birth: 07/17/1955           MRN: [id number] Visit Date: 05/07/2018              Requested by: Remus LofflerJones, Angel S, PA-C [address] [place], [place] [id number] PCP: Remus LofflerJones, Angel S, PA-C   Assessment & Plan: Visit Diagnoses:  1. Chronic right shoulder pain     Plan: Impingement syndrome right shoulder.  Certainly a possibility of a rotator cuff tear.  Will inject the subacromial space with cortisone and monitor response.  If no improvement over the next several weeks would suggest an MRI scan to rule out cuff tear  Follow-Up Instructions: Return if symptoms worsen or fail to improve.   Orders:  Orders Placed This Encounter  Procedures  . Large Joint Inj: R subacromial bursa  . XR Shoulder Right   No orders of the defined types were placed in this encounter.     Procedures: Large Joint Inj: R subacromial bursa on 05/07/2018 9:14 AM Indications: pain and diagnostic evaluation Details: 25 G 1.5 in needle, anterolateral approach  Arthrogram: No  Medications: 2 mL lidocaine 2 %; 2 mL bupivacaine 0.5 %; 80 mg methylPREDNISolone acetate 40 MG/ML Consent was given by the patient. Immediately prior to procedure a time out was called to verify the correct patient, procedure, equipment, support staff and site/side marked as required. Patient was prepped and draped in the usual sterile fashion.       Clinical Data: No additional findings.   Subjective: Chief Complaint  Patient presents with  . Right Shoulder - Pain  . Right Arm - Pain  Patient presents with right shoulder and right arm pain since August. She has no known injury. She was originally seen by her PCP and was given a steroid shot in her hip and home exercises to work on.  She did not get a lot of relief from this, and was given a prescription for oral prednisone.  This medication helped while she was taking it, but the pain is back now that medication has been  completed. She was also given tramadol for pain. This does help some.  She denies numbness or tingling.  Insidious onset of right shoulder pain in August.  Did well with meds only to have a recurrence.  She is having pain along the anterior aspect of her shoulder particularly with overhead activity.  She does work at [employer] with repetitive overhead activity.  No injury or trauma.  She is had a combination of muscle relaxant, tramadol and Tylenol HPI  Review of Systems   Objective: Vital Signs: BP 105/77   Pulse 71   Ht 4\' 9"  (1.448 m)   Wt 112 lb (50.8 kg)   BMI 24.24 kg/m   Physical Exam  Constitutional: She is oriented to person, place, and time. She appears well-developed and well-nourished.  HENT:  Mouth/Throat: Oropharynx is clear and moist.  Eyes: Pupils are equal, round, and reactive to light. EOM are normal.  Pulmonary/Chest: Effort normal.  Neurological: She is alert and oriented to person, place, and time.  Skin: Skin is warm and dry.  Psychiatric: She has a normal mood and affect. Her behavior is normal.    Ortho Exam awake alert and oriented x3 comfortable sitting.  Positive impingement on extremes of external rotation right shoulder.  Minimally positive empty can test.  Minimally positive speeds  sign.  Some pain along the anterior aspect of the shoulder.  None of the acromioclavicular joint.  No popping or clicking.  Good strength biceps intact  Specialty Comments:  No specialty comments available.  Imaging: Xr Shoulder Right  Result Date: 05/07/2018 Films of the right shoulder obtained in several projections.  There is no ectopic calcification.  Minimal degenerative change of the acromioclavicular joint.  Humeral head is centered about the glenoid.  No decrease in the glenohumeral joint space.  No acute change    PMFS History: Patient Active Problem List   Diagnosis Date Noted  . Chronic right-sided low back pain with right-sided sciatica 10/10/2017  . Acute  recurrent frontal sinusitis 07/03/2017  . Olecranon bursitis of right elbow 07/14/2016  . Migraine 07/14/2016  . Sexual dysfunction 07/14/2016  . History of CVA (cerebrovascular accident) 04/08/2016  . HTN (hypertension), benign 04/08/2016  . Depression 04/08/2016   Past Medical History:  Diagnosis Date  . Hypertension   . Migraines   . Sciatica   . TIA (transient ischemic attack)     History reviewed. No pertinent family history.  History reviewed. No pertinent surgical history. Social History   Occupational History  . Not on file  Tobacco Use  . Smoking status: Never Smoker  . Smokeless tobacco: Never Used  Substance and Sexual Activity  . Alcohol use: Yes    Comment: occ  . Drug use: No  . Sexual activity: Not on file

## 2018-05-30 ENCOUNTER — Other Ambulatory Visit: Payer: Self-pay | Admitting: Physician Assistant

## 2018-06-14 ENCOUNTER — Other Ambulatory Visit: Payer: Self-pay | Admitting: Physician Assistant

## 2018-06-14 NOTE — Telephone Encounter (Signed)
Last seen 04/21/18  [name]

## 2018-06-14 NOTE — Telephone Encounter (Signed)
1 month refill sent.  Further fills per PCP

## 2018-06-16 HISTORY — PX: ROTATOR CUFF REPAIR: SHX139

## 2018-06-17 ENCOUNTER — Other Ambulatory Visit: Payer: Self-pay | Admitting: Physician Assistant

## 2018-06-17 MED ORDER — TRAZODONE HCL 50 MG PO TABS: 100.0000 mg | ORAL_TABLET | Freq: Every day | ORAL | 5 refills | Status: DC

## 2018-06-29 ENCOUNTER — Other Ambulatory Visit: Payer: Self-pay | Admitting: Physician Assistant

## 2018-06-30 NOTE — Telephone Encounter (Signed)
Last seen 07/01/17

## 2018-07-10 ENCOUNTER — Encounter (HOSPITAL_COMMUNITY): Payer: Self-pay

## 2018-07-10 ENCOUNTER — Emergency Department (HOSPITAL_COMMUNITY): Payer: 59

## 2018-07-10 ENCOUNTER — Inpatient Hospital Stay (HOSPITAL_COMMUNITY)
Admission: EM | Admit: 2018-07-10 | Discharge: 2018-07-13 | DRG: 390 | Disposition: A | Discharge status: Home / Self Care | Payer: 59 | Attending: Surgery | Admitting: Surgery

## 2018-07-10 DIAGNOSIS — R109 Unspecified abdominal pain: Secondary | ICD-10-CM | POA: Diagnosis present

## 2018-07-10 DIAGNOSIS — D72829 Elevated white blood cell count, unspecified: Secondary | ICD-10-CM | POA: Diagnosis not present

## 2018-07-10 DIAGNOSIS — R911 Solitary pulmonary nodule: Secondary | ICD-10-CM | POA: Diagnosis not present

## 2018-07-10 DIAGNOSIS — Z881 Allergy status to other antibiotic agents status: Secondary | ICD-10-CM

## 2018-07-10 DIAGNOSIS — R1110 Vomiting, unspecified: Secondary | ICD-10-CM | POA: Diagnosis not present

## 2018-07-10 DIAGNOSIS — R918 Other nonspecific abnormal finding of lung field: Secondary | ICD-10-CM

## 2018-07-10 DIAGNOSIS — R1011 Right upper quadrant pain: Secondary | ICD-10-CM | POA: Diagnosis not present

## 2018-07-10 DIAGNOSIS — K56609 Unspecified intestinal obstruction, unspecified as to partial versus complete obstruction: Secondary | ICD-10-CM

## 2018-07-10 DIAGNOSIS — R1013 Epigastric pain: Secondary | ICD-10-CM | POA: Diagnosis not present

## 2018-07-10 DIAGNOSIS — Z79891 Long term (current) use of opiate analgesic: Secondary | ICD-10-CM

## 2018-07-10 DIAGNOSIS — Z79899 Other long term (current) drug therapy: Secondary | ICD-10-CM

## 2018-07-10 DIAGNOSIS — M1611 Unilateral primary osteoarthritis, right hip: Secondary | ICD-10-CM | POA: Diagnosis present

## 2018-07-10 DIAGNOSIS — K5730 Diverticulosis of large intestine without perforation or abscess without bleeding: Secondary | ICD-10-CM | POA: Diagnosis not present

## 2018-07-10 DIAGNOSIS — Z7982 Long term (current) use of aspirin: Secondary | ICD-10-CM

## 2018-07-10 DIAGNOSIS — Z7951 Long term (current) use of inhaled steroids: Secondary | ICD-10-CM

## 2018-07-10 DIAGNOSIS — I10 Essential (primary) hypertension: Secondary | ICD-10-CM | POA: Diagnosis present

## 2018-07-10 DIAGNOSIS — G8929 Other chronic pain: Secondary | ICD-10-CM | POA: Diagnosis present

## 2018-07-10 DIAGNOSIS — R112 Nausea with vomiting, unspecified: Secondary | ICD-10-CM | POA: Diagnosis not present

## 2018-07-10 DIAGNOSIS — Z8673 Personal history of transient ischemic attack (TIA), and cerebral infarction without residual deficits: Secondary | ICD-10-CM

## 2018-07-10 LAB — URINALYSIS, ROUTINE W REFLEX MICROSCOPIC
BILIRUBIN URINE: NEGATIVE
Bacteria, UA: NONE SEEN
Glucose, UA: NEGATIVE mg/dL
KETONES UR: NEGATIVE mg/dL
Nitrite: NEGATIVE
Protein, ur: NEGATIVE mg/dL
Specific Gravity, Urine: 1.005 (ref 1.005–1.030)
pH: 5 (ref 5.0–8.0)

## 2018-07-10 LAB — CBC
HCT: 38.9 % (ref 36.0–46.0)
Hemoglobin: 11.6 g/dL — ABNORMAL LOW (ref 12.0–15.0)
MCH: 27.8 pg (ref 26.0–34.0)
MCHC: 29.8 g/dL — AB (ref 30.0–36.0)
MCV: 93.3 fL (ref 80.0–100.0)
Platelets: 312 10*3/uL (ref 150–400)
RBC: 4.17 MIL/uL (ref 3.87–5.11)
RDW: 14 % (ref 11.5–15.5)
WBC: 9.6 10*3/uL (ref 4.0–10.5)
nRBC: 0 % (ref 0.0–0.2)

## 2018-07-10 LAB — COMPREHENSIVE METABOLIC PANEL
ALT: 16 U/L (ref 0–44)
AST: 21 U/L (ref 15–41)
Albumin: 3.7 g/dL (ref 3.5–5.0)
Alkaline Phosphatase: 59 U/L (ref 38–126)
Anion gap: 11 (ref 5–15)
BUN: 15 mg/dL (ref 8–23)
CO2: 26 mmol/L (ref 22–32)
Calcium: 9.7 mg/dL (ref 8.9–10.3)
Chloride: 99 mmol/L (ref 98–111)
Creatinine, Ser: 0.73 mg/dL (ref 0.44–1.00)
GFR calc Af Amer: >60 mL/min (ref >60)
GFR calc non Af Amer: >60 mL/min (ref >60)
Glucose, Bld: 146 mg/dL — ABNORMAL HIGH (ref 70–99)
Potassium: 3.4 mmol/L — ABNORMAL LOW (ref 3.5–5.1)
Sodium: 136 mmol/L (ref 135–145)
Total Bilirubin: 0.3 mg/dL (ref 0.3–1.2)
Total Protein: 6.5 g/dL (ref 6.5–8.1)

## 2018-07-10 LAB — LIPASE, BLOOD: Lipase: 57 U/L — ABNORMAL HIGH (ref 11–51)

## 2018-07-10 MED ORDER — DEXTROSE IN LACTATED RINGERS 5 % IV SOLN
INTRAVENOUS | Status: DC
  Administered 2018-07-10 – 2018-07-13 (×7): via INTRAVENOUS

## 2018-07-10 MED ORDER — ENOXAPARIN SODIUM 40 MG/0.4ML ~~LOC~~ SOLN
40.0000 mg | SUBCUTANEOUS | Status: DC
  Administered 2018-07-11 – 2018-07-12 (×2): 40 mg via SUBCUTANEOUS
  Filled 2018-07-10 (×2): qty 0.4

## 2018-07-10 MED ORDER — FENTANYL CITRATE (PF) 100 MCG/2ML IJ SOLN: 25.0000 ug | INTRAMUSCULAR | Status: DC | PRN

## 2018-07-10 MED ORDER — ONDANSETRON HCL 4 MG/2ML IJ SOLN: 4.0000 mg | Freq: Four times a day (QID) | INTRAMUSCULAR | Status: DC | PRN

## 2018-07-10 MED ORDER — BUSPIRONE HCL 15 MG PO TABS
15.0000 mg | ORAL_TABLET | Freq: Three times a day (TID) | ORAL | Status: DC
  Administered 2018-07-10 – 2018-07-13 (×8): 15 mg via ORAL
  Filled 2018-07-10 (×8): qty 1

## 2018-07-10 MED ORDER — TRAZODONE HCL 100 MG PO TABS
100.0000 mg | ORAL_TABLET | Freq: Every day | ORAL | Status: DC
  Administered 2018-07-10 – 2018-07-12 (×3): 100 mg via ORAL
  Filled 2018-07-10 (×3): qty 1

## 2018-07-10 MED ORDER — ONDANSETRON 4 MG PO TBDP: 4.0000 mg | ORAL_TABLET | Freq: Four times a day (QID) | ORAL | Status: DC | PRN

## 2018-07-10 MED ORDER — HYDRALAZINE HCL 20 MG/ML IJ SOLN: 10.0000 mg | INTRAMUSCULAR | Status: DC | PRN

## 2018-07-10 MED ORDER — SODIUM CHLORIDE 0.9% FLUSH: 3.0000 mL | Freq: Once | INTRAVENOUS | Status: DC

## 2018-07-10 MED ORDER — SODIUM CHLORIDE 0.9 % IV BOLUS
500.0000 mL | Freq: Once | INTRAVENOUS | Status: AC
  Administered 2018-07-10: 500 mL via INTRAVENOUS

## 2018-07-10 MED ORDER — IOHEXOL 300 MG/ML  SOLN
100.0000 mL | Freq: Once | INTRAMUSCULAR | Status: AC | PRN
  Administered 2018-07-10: 100 mL via INTRAVENOUS

## 2018-07-10 NOTE — ED Triage Notes (Signed)
Pt reports last drank soda at 1300 today.

## 2018-07-10 NOTE — ED Notes (Signed)
Report attempted 

## 2018-07-10 NOTE — ED Triage Notes (Signed)
Pt presents for evaluation of RUQ pain starting last night. Was seen at [facility] today and sent here. Reports decreased appetite. Pt was told she had an elevated WBC.

## 2018-07-10 NOTE — ED Triage Notes (Signed)
Pt given ice chips

## 2018-07-10 NOTE — H&P (Signed)
Tracy Jensen is an 63 y.o. female.   Chief Complaint: Abdominal pain nausea vomiting x1 day HPI: Patient presents emergency room with a 1 day history of abdominal pain, nausea and vomiting.  The pain started last night.  [place] was sent here for further evaluation.  She was complaining of epigastric abdominal pain which radiated into her right lower quadrant.  Currently, she feels much better.  She had 2 episodes of emesis earlier today but nothing over the last 6 hours.  CT scan was done which showed a possible closed-loop bowel obstruction.  She denies any abdominal pain currently.  She is now been receiving pain medication.  She has been up walking around the emergency room with minimal mild to moderate epigastric discomfort and discomfort just above her umbilicus.  She rates it as a 2-3 out of 10 now.  There is no radiation and she had a bowel movement earlier today.  Past Medical History:  Diagnosis Date  . Hypertension   . Migraines   . Sciatica   . TIA (transient ischemic attack)     History reviewed. No pertinent surgical history.  No family history on file. Social History:  reports that she has never smoked. She has never used smokeless tobacco. She reports current alcohol use. She reports that she does not use drugs.  Allergies:  Allergies  Allergen Reactions  . Azithromycin Hives    Hives after Zpack    (Not in a hospital admission)   Results for orders placed or performed during the hospital encounter of 07/10/18 (from the past 48 hour(s))  Lipase, blood     Status: Abnormal   Collection Time: 07/10/18  1:28 PM  Result Value Ref Range   Lipase 57 (H) 11 - 51 U/L    Comment: Performed at [facility], [address] [place], [place] [address]  Comprehensive metabolic panel     Status: Abnormal   Collection Time: 07/10/18  1:28 PM  Result Value Ref Range   Sodium 136 135 - 145 mmol/L   Potassium 3.4 (L) 3.5 - 5.1  mmol/L   Chloride 99 98 - 111 mmol/L   CO2 26 22 - 32 mmol/L   Glucose, Bld 146 (H) 70 - 99 mg/dL   BUN 15 8 - 23 mg/dL   Creatinine, Ser 4.090.73 0.44 - 1.00 mg/dL   Calcium 9.7 8.9 - 81.110.3 mg/dL   Total Protein 6.5 6.5 - 8.1 g/dL   Albumin 3.7 3.5 - 5.0 g/dL   AST 21 15 - 41 U/L   ALT 16 0 - 44 U/L   Alkaline Phosphatase 59 38 - 126 U/L   Total Bilirubin 0.3 0.3 - 1.2 mg/dL   GFR calc non Af Amer >60 >60 mL/min   GFR calc Af Amer >60 >60 mL/min   Anion gap 11 5 - 15    Comment: Performed at [facility], [address] [place], [place] [address]  CBC     Status: Abnormal   Collection Time: 07/10/18  1:28 PM  Result Value Ref Range   WBC 9.6 4.0 - 10.5 K/uL   RBC 4.17 3.87 - 5.11 MIL/uL   Hemoglobin 11.6 (L) 12.0 - 15.0 g/dL   HCT 29.538.9 62.136.0 - 30.846.0 %   MCV 93.3 80.0 - 100.0 fL   MCH 27.8 26.0 - 34.0 pg   MCHC 29.8 (L) 30.0 - 36.0 g/dL   RDW  14.0 11.5 - 15.5 %   Platelets 312 150 - 400 K/uL   nRBC 0.0 0.0 - 0.2 %    Comment: Performed at [facility], [address] [place], [place] [address]  Urinalysis, Routine w reflex microscopic     Status: Abnormal   Collection Time: 07/10/18  2:58 PM  Result Value Ref Range   Color, Urine STRAW (A) YELLOW   APPearance CLEAR CLEAR   Specific Gravity, Urine 1.005 1.005 - 1.030   pH 5.0 5.0 - 8.0   Glucose, UA NEGATIVE NEGATIVE mg/dL   Hgb urine dipstick SMALL (A) NEGATIVE   Bilirubin Urine NEGATIVE NEGATIVE   Ketones, ur NEGATIVE NEGATIVE mg/dL   Protein, ur NEGATIVE NEGATIVE mg/dL   Nitrite NEGATIVE NEGATIVE   Leukocytes, UA SMALL (A) NEGATIVE   RBC / HPF 0-5 0 - 5 RBC/hpf   WBC, UA 6-10 0 - 5 WBC/hpf   Bacteria, UA NONE SEEN NONE SEEN   Squamous Epithelial / LPF 0-5 0 - 5   Mucus PRESENT    Non Squamous Epithelial 0-5 (A) NONE SEEN    Comment: Performed at [facility], [address] [place], [place] [address]   Ct Abdomen Pelvis W Contrast  Result Date: 07/10/2018 CLINICAL DATA:  Right upper quadrant  abdominal pain since last night. Decreased appetite. EXAM: CT ABDOMEN AND PELVIS WITH CONTRAST TECHNIQUE: Multidetector CT imaging of the abdomen and pelvis was performed using the standard protocol following bolus administration of intravenous contrast. CONTRAST:  OMNIPAQUE IOHEXOL 300 MG/ML  SOLN COMPARISON:  Lumbar spine MR dated 12/25/2017. FINDINGS: Lower chest: 3 mm subpleural nodule in the left lower lobe on image number 6 series 5. 2 mm subpleural nodule in the right lower lobe on image number 4 series 5. 2 mm subpleural nodule in the right lower lobe on image number 1 series 5 and additional 2 mm nodule in the periphery of the right lower lobe on that image. There is also a 4 mm possible partially included nodule in that region of the right lower lobe on that image. Hepatobiliary: Small area of focal fat deposition in the liver adjacent to the falciform ligament. Normal appearing gallbladder. Pancreas: Moderate diffuse pancreatic atrophy. Spleen: Normal in size without focal abnormality. Adrenals/Urinary Tract: Adrenal glands are unremarkable. Kidneys are normal, without renal calculi, focal lesion, or hydronephrosis. Bladder is unremarkable. Stomach/Bowel: Multiple mildly dilated loops of jejunum, becoming more dilated in the right lower abdomen, with fecalized contents in the right lower abdomen and pelvis. There is a transition point in the right lower abdomen. There is also swirling of the central mesentery at that level. The distal small bowel loops are normal in caliber as is the colon. No bowel wall thickening or pneumatosis is seen. There is mildly prominent stool in the colon and scattered diverticula. Small hiatal hernia. Otherwise, normal appearing stomach and proximal small bowel. No evidence of appendicitis. Vascular/Lymphatic: No significant vascular findings are present. Mildly prominent right lower quadrant mesenteric lymph nodes. The largest has a short axis diameter of 9 mm on image  number 34 series 3. Reproductive: Status post hysterectomy. No adnexal masses. Other: Small to moderate amount of free peritoneal fluid in the pelvis. No free peritoneal air. Small bilateral proximal inguinal hernias containing fat. Musculoskeletal: Right femoral neck bone island. Mild facet degenerative changes in the lower lumbar spine. IMPRESSION: 1. Findings compatible with closed loop partial small bowel obstruction due to an internal hernia with a transition point in  the right lower abdomen. 2. Small to moderate amount of free peritoneal fluid in the pelvis. 3. Mildly prominent right lower quadrant mesenteric lymph nodes, most likely reactive. 4. Mild colonic diverticulosis. 5. Small bilateral lung nodules, as described above. No follow-up needed if patient is low-risk (and has no known or suspected primary neoplasm). Non-contrast chest CT can be considered in 12 months if patient is high-risk. This recommendation follows the consensus statement: Guidelines for Management of Incidental Pulmonary Nodules Detected on CT Images: From the Fleischner Society 2017; Radiology 2017; 284:228-243. Electronically Signed   By: Beckie Salts M.D.   On: 07/10/2018 15:57    Review of Systems  Constitutional: Negative.   Eyes: Negative.   Gastrointestinal: Positive for abdominal pain, nausea and vomiting.  Genitourinary: Negative.   Skin: Negative.   Neurological: Negative.     Blood pressure 122/86, pulse 92, temperature 97.9 F (36.6 C), temperature source Oral, resp. rate 20, SpO2 100 %. Physical Exam  Constitutional: She is oriented to person, place, and time. She appears well-developed and well-nourished. No distress.  HENT:  Head: Normocephalic and atraumatic.  Eyes: Pupils are equal, round, and reactive to light. EOM are normal.  Neck: Normal range of motion. Neck supple.  Cardiovascular: Normal rate and regular rhythm.  Respiratory: Effort normal and breath sounds normal.  GI: Soft. Bowel sounds  are normal. She exhibits no distension. There is no abdominal tenderness. There is no rebound.  Musculoskeletal: Normal range of motion.  Neurological: She is alert and oriented to person, place, and time.  Skin: Skin is warm and dry.  Psychiatric: She has a normal mood and affect. Her behavior is normal.     Assessment/Plan Abdominal pain-reviewed CT scan and examined patient.  She has a benign abdomen.  This is quite unusual for anyone with a close obstruction to have a normal examination at this point.  I recommended admission though for observation and repeat films and lab work in the morning to make sure that she is not progressing.  If she is doing okay, will feed a diet in a.m. and plan on discharge tomorrow afternoon.  She may require further work-up or CT enterography but currently she is asymptomatic and does not require any emergent surgical intervention.  Dortha Schwalbe, MD 07/10/2018, 5:44 PM

## 2018-07-10 NOTE — ED Provider Notes (Signed)
[facility] EMERGENCY DEPARTMENT Provider Note   CSN: [id number] Arrival date & time: 07/10/18  1311     History   Chief Complaint Chief Complaint  Patient presents with  . Abdominal Pain    HPI Tracy Jensen is a 63 y.o. female.  HPI  63 year old female history of hypertension presents today complaining of epigastric and right upper quadrant pain.  States that it began yesterday.  She has had associated nausea with two episode of vomiting today of yellowish substance.  She states that she was seen in urgent care in [facility] and was told that her labs were abnormal.  However, she states she only had a fingerstick.  She denies any history of diabetes.  She states that she was told to come here for further evaluation.  She has not taken anything by mouth today due to the pain.  The pain is moderate and appears to be worsened with food She denies fever chills, constipation, or diarrhea. NO history of abdominal surgeries pmd- Prudy Feeler [facility] Past Medical History:  Diagnosis Date  . Hypertension   . Migraines   . Sciatica   . TIA (transient ischemic attack)     Patient Active Problem List   Diagnosis Date Noted  . Chronic right-sided low back pain with right-sided sciatica 10/10/2017  . Acute recurrent frontal sinusitis 07/03/2017  . Olecranon bursitis of right elbow 07/14/2016  . Migraine 07/14/2016  . Sexual dysfunction 07/14/2016  . History of CVA (cerebrovascular accident) 04/08/2016  . HTN (hypertension), benign 04/08/2016  . Depression 04/08/2016    History reviewed. No pertinent surgical history.   OB History   No obstetric history on file.      Home Medications    Prior to Admission medications   Medication Sig Start Date End Date Taking? Authorizing Provider  ALPRAZolam Prudy Feeler) 0.5 MG tablet TAKE 1 TABLET BY MOUTH TWICE DAILY AS NEEDED 08/31/17   Remus Loffler, PA-C  aspirin EC 81 MG tablet Take 81 mg by mouth daily.    [provider]  busPIRone (BUSPAR) 15 MG tablet TAKE 1 TABLET BY MOUTH THREE TIMES DAILY 03/25/18   Remus Loffler, PA-C  butalbital-acetaminophen-caffeine (FIORICET, ESGIC) 50-325-40 MG tablet TAKE ONE TABLET BY MOUTH EVERY 8 HOURS. 05/03/18   Remus Loffler, PA-C  cyclobenzaprine (FLEXERIL) 10 MG tablet TAKE 1 TABLET BY MOUTH THREE TIMES DAILY 02/19/18   Remus Loffler, PA-C  diclofenac (VOLTAREN) 75 MG EC tablet TAKE 1 TABLET BY MOUTH TWICE DAILY 05/31/18   Remus Loffler, PA-C  fluticasone (FLONASE) 50 MCG/ACT nasal spray Place 2 sprays into both nostrils daily. 05/05/17   Jannifer Rodney A, FNP  lisinopril (PRINIVIL,ZESTRIL) 20 MG tablet TAKE 1 TABLET BY MOUTH ONCE DAILY 05/31/18   Remus Loffler, PA-C  methocarbamol (ROBAXIN) 500 MG tablet TAKE 1 TABLET BY MOUTH EVERY 8 HOURS AS NEEDED FOR MUSCLE SPASM 06/30/18   Remus Loffler, PA-C  predniSONE (STERAPRED UNI-PAK 21 TAB) 10 MG (21) TBPK tablet As directed x 6 days Patient not taking: Reported on 05/07/2018 04/21/18   Remus Loffler, PA-C  sildenafil (REVATIO) 20 MG tablet TAKE 1 TO 2&1/2 TABLETS DAILY 07/03/17   Remus Loffler, PA-C  traMADol (ULTRAM) 50 MG tablet Take 1 tablet (50 mg total) by mouth every 6 (six) hours as needed. 04/21/18   Remus Loffler, PA-C  traZODone (DESYREL) 50 MG tablet Take 2 tablets (100 mg total) by mouth at bedtime. 06/17/18  Remus LofflerJones, Angel S, PA-C    Family History No family history on file.  Social History Social History   Tobacco Use  . Smoking status: Never Smoker  . Smokeless tobacco: Never Used  Substance Use Topics  . Alcohol use: Yes    Comment: occ  . Drug use: No     Allergies   Azithromycin   Review of Systems Review of Systems  All other systems reviewed and are negative.    Physical Exam Updated Vital Signs BP 122/86 (BP Location: Right Arm)   Pulse 92   Temp 97.9 F (36.6 C) (Oral)   Resp 20   SpO2 100%   Physical Exam Vitals signs and nursing note reviewed.  Constitutional:       Appearance: She is well-developed and normal weight.  HENT:     Head: Normocephalic and atraumatic.     Mouth/Throat:     Mouth: Mucous membranes are moist.  Eyes:     Extraocular Movements: Extraocular movements intact.     Pupils: Pupils are equal, round, and reactive to light.  Cardiovascular:     Rate and Rhythm: Normal rate and regular rhythm.  Pulmonary:     Effort: Pulmonary effort is normal.  Abdominal:     General: Abdomen is flat. Bowel sounds are normal.     Palpations: Abdomen is soft.     Tenderness: There is abdominal tenderness in the right upper quadrant.  Skin:    General: Skin is warm.     Capillary Refill: Capillary refill takes less than 2 seconds.     Coloration: Skin is jaundiced.  Neurological:     General: No focal deficit present.     Mental Status: She is alert. She is disoriented.  Psychiatric:        Mood and Affect: Mood normal.      ED Treatments / Results  Labs (all labs ordered are listed, but only abnormal results are displayed) Labs Reviewed  LIPASE, BLOOD  COMPREHENSIVE METABOLIC PANEL  CBC  URINALYSIS, ROUTINE W REFLEX MICROSCOPIC    EKG None  Radiology Ct Abdomen Pelvis W Contrast  Result Date: 07/10/2018 CLINICAL DATA:  Right upper quadrant abdominal pain since last night. Decreased appetite. EXAM: CT ABDOMEN AND PELVIS WITH CONTRAST TECHNIQUE: Multidetector CT imaging of the abdomen and pelvis was performed using the standard protocol following bolus administration of intravenous contrast. CONTRAST:  100mL OMNIPAQUE IOHEXOL 300 MG/ML  SOLN COMPARISON:  Lumbar spine MR dated 12/25/2017. FINDINGS: Lower chest: 3 mm subpleural nodule in the left lower lobe on image number 6 series 5. 2 mm subpleural nodule in the right lower lobe on image number 4 series 5. 2 mm subpleural nodule in the right lower lobe on image number 1 series 5 and additional 2 mm nodule in the periphery of the right lower lobe on that image. There is also a 4  mm possible partially included nodule in that region of the right lower lobe on that image. Hepatobiliary: Small area of focal fat deposition in the liver adjacent to the falciform ligament. Normal appearing gallbladder. Pancreas: Moderate diffuse pancreatic atrophy. Spleen: Normal in size without focal abnormality. Adrenals/Urinary Tract: Adrenal glands are unremarkable. Kidneys are normal, without renal calculi, focal lesion, or hydronephrosis. Bladder is unremarkable. Stomach/Bowel: Multiple mildly dilated loops of jejunum, becoming more dilated in the right lower abdomen, with fecalized contents in the right lower abdomen and pelvis. There is a transition point in the right lower abdomen. There is also swirling of  the central mesentery at that level. The distal small bowel loops are normal in caliber as is the colon. No bowel wall thickening or pneumatosis is seen. There is mildly prominent stool in the colon and scattered diverticula. Small hiatal hernia. Otherwise, normal appearing stomach and proximal small bowel. No evidence of appendicitis. Vascular/Lymphatic: No significant vascular findings are present. Mildly prominent right lower quadrant mesenteric lymph nodes. The largest has a short axis diameter of 9 mm on image number 34 series 3. Reproductive: Status post hysterectomy. No adnexal masses. Other: Small to moderate amount of free peritoneal fluid in the pelvis. No free peritoneal air. Small bilateral proximal inguinal hernias containing fat. Musculoskeletal: Right femoral neck bone island. Mild facet degenerative changes in the lower lumbar spine. IMPRESSION: 1. Findings compatible with closed loop partial small bowel obstruction due to an internal hernia with a transition point in the right lower abdomen. 2. Small to moderate amount of free peritoneal fluid in the pelvis. 3. Mildly prominent right lower quadrant mesenteric lymph nodes, most likely reactive. 4. Mild colonic diverticulosis. 5. Small  bilateral lung nodules, as described above. No follow-up needed if patient is low-risk (and has no known or suspected primary neoplasm). Non-contrast chest CT can be considered in 12 months if patient is high-risk. This recommendation follows the consensus statement: Guidelines for Management of Incidental Pulmonary Nodules Detected on CT Images: From the Fleischner Society 2017; Radiology 2017; 284:228-243. Electronically Signed   By: Beckie SaltsSteven  Reid M.D.   On: 07/10/2018 15:57    Procedures Procedures (including critical care time)  Medications Ordered in ED Medications  sodium chloride flush (NS) 0.9 % injection 3 mL (has no administration in time range)  sodium chloride 0.9 % bolus 500 mL (has no administration in time range)     Initial Impression / Assessment and Plan / ED Course  I have reviewed the triage vital signs and the nursing notes.  Pertinent labs & imaging results that were available during my care of the patient were reviewed by me and considered in my medical decision making (see chart for details).     63 year old female presents today with abdominal pain nausea and vomiting that began yesterday.  CT here probable small bowel obstruction with closed-loop obstruction.  Patient appears hemodynamically stable.  I discussed results of the CT including pulmonary nodules with the patient.  She is n.p.o. here.  Surgery is paged 1- abdominal pain- ct with sbo 2- elevated lipase 3- pulmonary nodules on ct - will need f/u with pmd Discussed with Dr. Luisa Hartornett and he will see for evaluation Final Clinical Impressions(s) / ED Diagnoses   Final diagnoses:  SBO (small bowel obstruction) [facility](HCC)  Pulmonary nodules    ED Discharge Orders    None       Margarita Grizzleay, Trevonne Nyland, MD 07/10/18 1626

## 2018-07-11 ENCOUNTER — Encounter (HOSPITAL_COMMUNITY): Payer: Self-pay | Admitting: *Deleted

## 2018-07-11 ENCOUNTER — Observation Stay (HOSPITAL_COMMUNITY): Payer: 59

## 2018-07-11 ENCOUNTER — Other Ambulatory Visit: Payer: Self-pay

## 2018-07-11 DIAGNOSIS — R109 Unspecified abdominal pain: Secondary | ICD-10-CM | POA: Diagnosis not present

## 2018-07-11 DIAGNOSIS — R1013 Epigastric pain: Secondary | ICD-10-CM | POA: Diagnosis not present

## 2018-07-11 LAB — HIV ANTIBODY (ROUTINE TESTING W REFLEX): HIV Screen 4th Generation wRfx: NONREACTIVE

## 2018-07-11 LAB — COMPREHENSIVE METABOLIC PANEL
ALT: 13 U/L (ref 0–44)
AST: 13 U/L — ABNORMAL LOW (ref 15–41)
Albumin: 3.1 g/dL — ABNORMAL LOW (ref 3.5–5.0)
Alkaline Phosphatase: 49 U/L (ref 38–126)
Anion gap: 8 (ref 5–15)
BUN: 7 mg/dL — ABNORMAL LOW (ref 8–23)
CO2: 27 mmol/L (ref 22–32)
Calcium: 8.6 mg/dL — ABNORMAL LOW (ref 8.9–10.3)
Chloride: 105 mmol/L (ref 98–111)
Creatinine, Ser: 0.55 mg/dL (ref 0.44–1.00)
GFR calc Af Amer: >60 mL/min (ref >60)
GFR calc non Af Amer: >60 mL/min (ref >60)
GLUCOSE: 143 mg/dL — AB (ref 70–99)
POTASSIUM: 3.6 mmol/L (ref 3.5–5.1)
Sodium: 140 mmol/L (ref 135–145)
TOTAL PROTEIN: 5.5 g/dL — AB (ref 6.5–8.1)
Total Bilirubin: 0.5 mg/dL (ref 0.3–1.2)

## 2018-07-11 LAB — CBC
HCT: 34.5 % — ABNORMAL LOW (ref 36.0–46.0)
HEMOGLOBIN: 11 g/dL — AB (ref 12.0–15.0)
MCH: 28.7 pg (ref 26.0–34.0)
MCHC: 31.9 g/dL (ref 30.0–36.0)
MCV: 90.1 fL (ref 80.0–100.0)
Platelets: 283 10*3/uL (ref 150–400)
RBC: 3.83 MIL/uL — ABNORMAL LOW (ref 3.87–5.11)
RDW: 14.1 % (ref 11.5–15.5)
WBC: 8.8 10*3/uL (ref 4.0–10.5)
nRBC: 0 % (ref 0.0–0.2)

## 2018-07-11 MED ORDER — LISINOPRIL 20 MG PO TABS
20.0000 mg | ORAL_TABLET | Freq: Every day | ORAL | Status: DC
  Administered 2018-07-11 – 2018-07-13 (×3): 20 mg via ORAL
  Filled 2018-07-11 (×3): qty 1

## 2018-07-11 MED ORDER — ACETAMINOPHEN 325 MG PO TABS
650.0000 mg | ORAL_TABLET | Freq: Four times a day (QID) | ORAL | Status: DC | PRN
  Administered 2018-07-11: 650 mg via ORAL
  Filled 2018-07-11: qty 2

## 2018-07-11 MED ORDER — METHOCARBAMOL 500 MG PO TABS
500.0000 mg | ORAL_TABLET | Freq: Three times a day (TID) | ORAL | Status: DC | PRN
  Administered 2018-07-11 – 2018-07-13 (×5): 500 mg via ORAL
  Filled 2018-07-11 (×5): qty 1

## 2018-07-11 NOTE — Plan of Care (Signed)
  Problem: Education: Goal: Knowledge of General Education information will improve Description: Including pain rating scale, medication(s)/side effects and non-pharmacologic comfort measures Outcome: Adequate for Discharge   

## 2018-07-11 NOTE — Progress Notes (Addendum)
Patient ID: Tracy Jensen, female   DOB: 03-18-1956, 63 y.o.   MRN: [id number]   Acute Care Surgery Service Progress Note:    Chief Complaint/Subjective: Biggest c/o is HA. Generally a fair amount of diet cokes and hasn't had one since Friday BM yesterday before coming to hospital +flatus Still with some mid abd soreness like last night but no n/v  Objective: Vital signs in last 24 hours: Temp:  [97.9 F (36.6 C)-99 F (37.2 C)] 98.6 F (37 C) (01/26 0539) Pulse Rate:  [82-92] 83 (01/26 0539) Resp:  [18-20] 18 (01/26 0539) BP: (110-143)/(73-88) 110/73 (01/26 0539) SpO2:  [99 %-100 %] 99 % (01/26 0539) Last BM Date: 07/10/18  Intake/Output from previous day: No intake/output data recorded. Intake/Output this shift: No intake/output data recorded.  Lungs: cta, nonlabored  Cardiovascular: reg  Abd: soft, not really distended, +BS, mild periumbilical TTP; no rebound/guarding  Extremities: no edema, +SCDs  Neuro: alert, nonfocal  Lab Results: CBC  Recent Labs    07/10/18 1328 07/11/18 0521  WBC 9.6 8.8  HGB 11.6* 11.0*  HCT 38.9 34.5*  PLT 312 283   BMET Recent Labs    07/10/18 1328 07/11/18 0521  NA 136 140  K 3.4* 3.6  CL 99 105  CO2 26 27  GLUCOSE 146* 143*  BUN 15 7*  CREATININE 0.73 0.55  CALCIUM 9.7 8.6*   LFT Hepatic Function Latest Ref Rng & Units 07/11/2018 07/10/2018 04/02/2016  Total Protein 6.5 - 8.1 g/dL 5.5(L) 6.5 6.7  Albumin 3.5 - 5.0 g/dL 3.1(L) 3.7 4.2  AST 15 - 41 U/L 13(L) 21 23  ALT 0 - 44 U/L _0 Alk Phosphatase 38 - 126 U/L 49 59 83  Total Bilirubin 0.3 - 1.2 mg/dL 0.5 0.3 0.5   PT/INR No results for input(s): LABPROT, INR in the last 72 hours. ABG No results for input(s): PHART, HCO3 in the last 72 hours.  Invalid input(s): PCO2, PO2  Studies/Results:  Anti-infectives: Anti-infectives (From admission, onward)   None      Medications: Scheduled Meds: . busPIRone  15 mg Oral TID  . enoxaparin (LOVENOX)  injection  40 mg Subcutaneous Q24H  . sodium chloride flush  3 mL Intravenous Once  . traZODone  100 mg Oral QHS   Continuous Infusions: . dextrose 5% lactated ringers 125 mL/hr at 07/11/18 0235   PRN Meds:.fentaNYL (SUBLIMAZE) injection, hydrALAZINE, ondansetron **OR** ondansetron (ZOFRAN) IV  Assessment/Plan: Patient Active Problem List   Diagnosis Date Noted  . Abdominal pain 07/10/2018  . Chronic right-sided low back pain with right-sided sciatica 10/10/2017  . Acute recurrent frontal sinusitis 07/03/2017  . Olecranon bursitis of right elbow 07/14/2016  . Migraine 07/14/2016  . Sexual dysfunction 07/14/2016  . History of CVA (cerebrovascular accident) 04/08/2016  . HTN (hypertension), benign 04/08/2016  . Depression 04/08/2016   Abdominal pain/n/v  Ct was concerning for closed loop obstruction. Has had 2 prior c/s. Exam and labs on admission were benign.   Her xray this am shows some dilated loops in abd in circular pattern (which is a little atypical)  However she remains afebrile, not tachy, normal labs and having flatus.   Will start clears and see how she does but I think she needs to stay today to make sure doesn't have worsening pain/vomiting with oral intake. If fails may need laparoscopy  Med for HA HTN - restart home med  LOS: 0 days    Leighton Ruff. Redmond Pulling, MD, FACS General, Bariatric, &  Minimally Invasive Surgery [phone] [facility]

## 2018-07-12 ENCOUNTER — Inpatient Hospital Stay (HOSPITAL_COMMUNITY): Payer: 59

## 2018-07-12 DIAGNOSIS — Z79891 Long term (current) use of opiate analgesic: Secondary | ICD-10-CM | POA: Diagnosis not present

## 2018-07-12 DIAGNOSIS — R109 Unspecified abdominal pain: Secondary | ICD-10-CM | POA: Diagnosis present

## 2018-07-12 DIAGNOSIS — Z8673 Personal history of transient ischemic attack (TIA), and cerebral infarction without residual deficits: Secondary | ICD-10-CM | POA: Diagnosis not present

## 2018-07-12 DIAGNOSIS — K5669 Other partial intestinal obstruction: Secondary | ICD-10-CM | POA: Diagnosis not present

## 2018-07-12 DIAGNOSIS — Z881 Allergy status to other antibiotic agents status: Secondary | ICD-10-CM | POA: Diagnosis not present

## 2018-07-12 DIAGNOSIS — M1611 Unilateral primary osteoarthritis, right hip: Secondary | ICD-10-CM | POA: Diagnosis not present

## 2018-07-12 DIAGNOSIS — R918 Other nonspecific abnormal finding of lung field: Secondary | ICD-10-CM | POA: Diagnosis present

## 2018-07-12 DIAGNOSIS — Z79899 Other long term (current) drug therapy: Secondary | ICD-10-CM | POA: Diagnosis not present

## 2018-07-12 DIAGNOSIS — Z7951 Long term (current) use of inhaled steroids: Secondary | ICD-10-CM | POA: Diagnosis not present

## 2018-07-12 DIAGNOSIS — Z7982 Long term (current) use of aspirin: Secondary | ICD-10-CM | POA: Diagnosis not present

## 2018-07-12 DIAGNOSIS — R110 Nausea: Secondary | ICD-10-CM | POA: Diagnosis not present

## 2018-07-12 DIAGNOSIS — K56609 Unspecified intestinal obstruction, unspecified as to partial versus complete obstruction: Secondary | ICD-10-CM | POA: Diagnosis not present

## 2018-07-12 DIAGNOSIS — G8929 Other chronic pain: Secondary | ICD-10-CM | POA: Diagnosis present

## 2018-07-12 DIAGNOSIS — I10 Essential (primary) hypertension: Secondary | ICD-10-CM | POA: Diagnosis present

## 2018-07-12 LAB — CBC
HCT: 31.5 % — ABNORMAL LOW (ref 36.0–46.0)
Hemoglobin: 9.7 g/dL — ABNORMAL LOW (ref 12.0–15.0)
MCH: 28.4 pg (ref 26.0–34.0)
MCHC: 30.8 g/dL (ref 30.0–36.0)
MCV: 92.4 fL (ref 80.0–100.0)
Platelets: 258 10*3/uL (ref 150–400)
RBC: 3.41 MIL/uL — ABNORMAL LOW (ref 3.87–5.11)
RDW: 14.2 % (ref 11.5–15.5)
WBC: 8.4 10*3/uL (ref 4.0–10.5)
nRBC: 0 % (ref 0.0–0.2)

## 2018-07-12 NOTE — Plan of Care (Signed)
  Problem: Clinical Measurements: Goal: Ability to maintain clinical measurements within normal limits will improve Outcome: Progressing Goal: Diagnostic test results will improve Outcome: Progressing   

## 2018-07-12 NOTE — Progress Notes (Signed)
Subjective/Chief Complaint: abdominalpain 2 BM  OVERNIGHT AND NO ABDOMINAL PAIN Films from yesterday show SBO  Complains of chronic right hip pain  Tolerated clears     Objective: Vital signs in last 24 hours: Temp:  [98.7 F (37.1 C)-99.5 F (37.5 C)] 98.7 F (37.1 C) (01/27 0609) Pulse Rate:  [78-80] 78 (01/27 0609) Resp:  [20] 20 (01/27 0600) BP: (117-122)/(69-76) 117/69 (01/27 0609) SpO2:  [98 %-100 %] 99 % (01/27 0609) Last BM Date: 07/10/18  Intake/Output from previous day: 01/26 0701 - 01/27 0700 In: 3803.3 [P.O.:720; I.V.:3083.3] Out: -  Intake/Output this shift: No intake/output data recorded.  General appearance: alert and cooperative Resp: clear to auscultation bilaterally GI: soft, non-tender; bowel sounds normal; no masses,  no organomegaly  Lab Results:  Recent Labs    07/10/18 1328 07/11/18 0521  WBC 9.6 8.8  HGB 11.6* 11.0*  HCT 38.9 34.5*  PLT 312 283   BMET Recent Labs    07/10/18 1328 07/11/18 0521  NA 136 140  K 3.4* 3.6  CL 99 105  CO2 26 27  GLUCOSE 146* 143*  BUN 15 7*  CREATININE 0.73 0.55  CALCIUM 9.7 8.6*   PT/INR No results for input(s): LABPROT, INR in the last 72 hours. ABG No results for input(s): PHART, HCO3 in the last 72 hours.  Invalid input(s): PCO2, PO2  Studies/Results: Dg Abd 1 View  Result Date: 07/11/2018 CLINICAL DATA:  Abdominal pain EXAM: ABDOMEN - 1 VIEW COMPARISON:  CT abdomen dated 07/10/2018. FINDINGS: Multiple mildly distended gas-filled loops of small bowel throughout the central abdomen, similar to appearance on the recent abdomen CT. No evidence of free intraperitoneal air. Lung bases are clear. IMPRESSION: Continued evidence of small bowel obstruction. Electronically Signed   By: Bary RichardStan  Maynard M.D.   On: 07/11/2018 07:54   Ct Abdomen Pelvis W Contrast  Result Date: 07/10/2018 CLINICAL DATA:  Right upper quadrant abdominal pain since last night. Decreased appetite. EXAM: CT ABDOMEN AND  PELVIS WITH CONTRAST TECHNIQUE: Multidetector CT imaging of the abdomen and pelvis was performed using the standard protocol following bolus administration of intravenous contrast. CONTRAST:  100mL OMNIPAQUE IOHEXOL 300 MG/ML  SOLN COMPARISON:  Lumbar spine MR dated 12/25/2017. FINDINGS: Lower chest: 3 mm subpleural nodule in the left lower lobe on image number 6 series 5. 2 mm subpleural nodule in the right lower lobe on image number 4 series 5. 2 mm subpleural nodule in the right lower lobe on image number 1 series 5 and additional 2 mm nodule in the periphery of the right lower lobe on that image. There is also a 4 mm possible partially included nodule in that region of the right lower lobe on that image. Hepatobiliary: Small area of focal fat deposition in the liver adjacent to the falciform ligament. Normal appearing gallbladder. Pancreas: Moderate diffuse pancreatic atrophy. Spleen: Normal in size without focal abnormality. Adrenals/Urinary Tract: Adrenal glands are unremarkable. Kidneys are normal, without renal calculi, focal lesion, or hydronephrosis. Bladder is unremarkable. Stomach/Bowel: Multiple mildly dilated loops of jejunum, becoming more dilated in the right lower abdomen, with fecalized contents in the right lower abdomen and pelvis. There is a transition point in the right lower abdomen. There is also swirling of the central mesentery at that level. The distal small bowel loops are normal in caliber as is the colon. No bowel wall thickening or pneumatosis is seen. There is mildly prominent stool in the colon and scattered diverticula. Small hiatal hernia. Otherwise, normal appearing stomach  and proximal small bowel. No evidence of appendicitis. Vascular/Lymphatic: No significant vascular findings are present. Mildly prominent right lower quadrant mesenteric lymph nodes. The largest has a short axis diameter of 9 mm on image number 34 series 3. Reproductive: Status post hysterectomy. No adnexal  masses. Other: Small to moderate amount of free peritoneal fluid in the pelvis. No free peritoneal air. Small bilateral proximal inguinal hernias containing fat. Musculoskeletal: Right femoral neck bone island. Mild facet degenerative changes in the lower lumbar spine. IMPRESSION: 1. Findings compatible with closed loop partial small bowel obstruction due to an internal hernia with a transition point in the right lower abdomen. 2. Small to moderate amount of free peritoneal fluid in the pelvis. 3. Mildly prominent right lower quadrant mesenteric lymph nodes, most likely reactive. 4. Mild colonic diverticulosis. 5. Small bilateral lung nodules, as described above. No follow-up needed if patient is low-risk (and has no known or suspected primary neoplasm). Non-contrast chest CT can be considered in 12 months if patient is high-risk. This recommendation follows the consensus statement: Guidelines for Management of Incidental Pulmonary Nodules Detected on CT Images: From the Fleischner Society 2017; Radiology 2017; 284:228-243. Electronically Signed   By: Beckie SaltsSteven  Reid M.D.   On: 07/10/2018 15:57    Anti-infectives: Anti-infectives (From admission, onward)   None      Assessment/Plan: Abdominal pain  No pain at this point but abnormal KUB- repeat KUB and labs  Advance diet May need laparoscopy but clinically normal this AM     LOS: 0 days    Tracy Jensen 07/12/2018

## 2018-07-12 NOTE — Plan of Care (Signed)
  Problem: Clinical Measurements: Goal: Ability to maintain clinical measurements within normal limits will improve Outcome: Progressing Goal: Diagnostic test results will improve Outcome: Progressing   Problem: Clinical Measurements: Goal: Ability to maintain clinical measurements within normal limits will improve Outcome: Progressing Goal: Diagnostic test results will improve Outcome: Progressing   Problem: Education: Goal: Knowledge of General Education information will improve Description Including pain rating scale, medication(s)/side effects and non-pharmacologic comfort measures Outcome: Adequate for Discharge   Problem: Health Behavior/Discharge Planning: Goal: Ability to manage health-related needs will improve Outcome: Adequate for Discharge   Problem: Clinical Measurements: Goal: Will remain free from infection Outcome: Adequate for Discharge Goal: Respiratory complications will improve Outcome: Adequate for Discharge Goal: Cardiovascular complication will be avoided Outcome: Adequate for Discharge

## 2018-07-13 MED ORDER — POLYETHYLENE GLYCOL 3350 17 G PO PACK: 17.0000 g | PACK | Freq: Every day | ORAL | 0 refills | Status: AC | PRN

## 2018-07-13 MED ORDER — DOCUSATE SODIUM 100 MG PO CAPS: 100.0000 mg | ORAL_CAPSULE | Freq: Every day | ORAL | 2 refills | Status: DC | PRN

## 2018-07-13 NOTE — Discharge Summary (Signed)
Patient ID: Tracy Jensen [id number] 1956-05-25 63 y.o.  Admit date: 07/10/2018 Discharge date: 07/13/2018  Admitting Diagnosis: Abdominal pain with nausea and vomiting  Discharge Diagnosis Patient Active Problem List   Diagnosis Date Noted  . Abdominal pain 07/10/2018  . Chronic right-sided low back pain with right-sided sciatica 10/10/2017  . Acute recurrent frontal sinusitis 07/03/2017  . Olecranon bursitis of right elbow 07/14/2016  . Migraine 07/14/2016  . Sexual dysfunction 07/14/2016  . History of CVA (cerebrovascular accident) 04/08/2016  . HTN (hypertension), benign 04/08/2016  . Depression 04/08/2016    Consultants None  Reason for Admission: Patient presents emergency room with a 1 day history of abdominal pain, nausea and vomiting.  The pain started last night.  She was seen at an urgent care in [facility] and was sent here for further evaluation.  She was complaining of epigastric abdominal pain which radiated into her right lower quadrant.  Currently, she feels much better.  She had 2 episodes of emesis earlier today but nothing over the last 6 hours.  CT scan was done which showed a possible closed-loop bowel obstruction.  She denies any abdominal pain currently.  She is now been receiving pain medication.  She has been up walking around the emergency room with minimal mild to moderate epigastric discomfort and discomfort just above her umbilicus.  She rates it as a 2-3 out of 10 now.  There is no radiation and she had a bowel movement earlier today.  Procedures None  Hospital Course:  Patient was admitted on 1/25 for abdominal pain with nausea and vomiting.  Her CT scan done in the emergency department showed a possible closed-loop bowel obstruction.  At time of admission patient's pain had a benign abdominal exam.  She was admitted with plan for observation, repeat films and lab work.  On 1/26 patient began having flatus and her nausea and vomiting  resolved.  She was given a trial of clears.  On 1/27 patient had 2 bowel movements overnight and denied any abdominal pain.  KUB showed decreased small bowel dilatation.  Patient was started on a soft diet which she tolerated.  1/28 patient only complains of chronic right hip pain from arthritis.  She denies any abdominal pain, nausea or vomiting.  She tolerated her diet yesterday and states she is hungry for breakfast this morning.  She has been mobilizing and voiding on her own.  She is only requiring p.o. Robaxin for right hip pain. VSS. She appears stable for discharge home (where she lives with her husband).  Physical Exam: Gen: WD, WN, NAD. Alert and orientated Heart: RRR Lungs: CTA b/l Abd: Soft, ND, NT. +BS MSK: Tenderness over the right hip and gluteus. Able ROM. No edema.  Allergies as of 07/13/2018      Reactions   Azithromycin Hives   Hives after Zpack      Medication List    TAKE these medications   ALPRAZolam 0.5 MG tablet Commonly known as:  XANAX TAKE 1 TABLET BY MOUTH TWICE DAILY AS NEEDED   aspirin EC 81 MG tablet Take 81 mg by mouth daily.   busPIRone 15 MG tablet Commonly known as:  BUSPAR TAKE 1 TABLET BY MOUTH THREE TIMES DAILY   butalbital-acetaminophen-caffeine 50-325-40 MG tablet Commonly known as:  FIORICET, ESGIC TAKE ONE TABLET BY MOUTH EVERY 8 HOURS.   cyclobenzaprine 10 MG tablet Commonly known as:  FLEXERIL TAKE 1 TABLET BY MOUTH THREE TIMES DAILY   diclofenac 75  MG EC tablet Commonly known as:  VOLTAREN TAKE 1 TABLET BY MOUTH TWICE DAILY   docusate sodium 100 MG capsule Commonly known as:  COLACE Take 1 capsule (100 mg total) by mouth daily as needed for mild constipation.   fluticasone 50 MCG/ACT nasal spray Commonly known as:  FLONASE Place 2 sprays into both nostrils daily.   lisinopril 20 MG tablet Commonly known as:  PRINIVIL,ZESTRIL TAKE 1 TABLET BY MOUTH ONCE DAILY   methocarbamol 500 MG tablet Commonly known as:   ROBAXIN TAKE 1 TABLET BY MOUTH EVERY 8 HOURS AS NEEDED FOR MUSCLE SPASM What changed:  See the new instructions.   polyethylene glycol packet Commonly known as:  MIRALAX / GLYCOLAX Take 17 g by mouth daily as needed for mild constipation or moderate constipation.   predniSONE 10 MG (21) Tbpk tablet Commonly known as:  STERAPRED UNI-PAK 21 TAB As directed x 6 days   sildenafil 20 MG tablet Commonly known as:  REVATIO TAKE 1 TO 2&1/2 TABLETS DAILY   traMADol 50 MG tablet Commonly known as:  ULTRAM Take 1 tablet (50 mg total) by mouth every 6 (six) hours as needed.   traZODone 50 MG tablet Commonly known as:  DESYREL Take 2 tablets (100 mg total) by mouth at bedtime.        Follow-up Information    Surgery, [facility]. Call.   Specialty:  General Surgery Why:  As needed  Contact information: [address] [place] [address] [phone]           Signed: Leary Roca, [facility] 07/13/2018, 8:57 AM Pager: [phone]

## 2018-07-27 ENCOUNTER — Other Ambulatory Visit: Payer: Self-pay | Admitting: Physician Assistant

## 2018-07-29 ENCOUNTER — Other Ambulatory Visit: Payer: Self-pay | Admitting: *Deleted

## 2018-07-29 DIAGNOSIS — G8929 Other chronic pain: Secondary | ICD-10-CM

## 2018-07-29 DIAGNOSIS — M25511 Pain in right shoulder: Principal | ICD-10-CM

## 2018-08-05 ENCOUNTER — Encounter: Payer: Self-pay | Admitting: Family Medicine

## 2018-08-05 ENCOUNTER — Ambulatory Visit (INDEPENDENT_AMBULATORY_CARE_PROVIDER_SITE_OTHER): Payer: 59 | Admitting: Family Medicine

## 2018-08-05 VITALS — BP 115/72 | HR 82 | Temp 97.0°F | Ht <= 58 in | Wt 109.0 lb

## 2018-08-05 DIAGNOSIS — G8929 Other chronic pain: Secondary | ICD-10-CM | POA: Insufficient documentation

## 2018-08-05 DIAGNOSIS — M25511 Pain in right shoulder: Secondary | ICD-10-CM

## 2018-08-05 MED ORDER — NAPROXEN 500 MG PO TABS: 500.0000 mg | ORAL_TABLET | Freq: Two times a day (BID) | ORAL | 0 refills | Status: AC

## 2018-08-05 MED ORDER — METHYLPREDNISOLONE ACETATE 40 MG/ML IJ SUSP
40.0000 mg | Freq: Once | INTRAMUSCULAR | Status: AC
  Administered 2018-08-05: 40 mg via INTRAMUSCULAR

## 2018-08-05 MED ORDER — METHOCARBAMOL 500 MG PO TABS: ORAL_TABLET | ORAL | 0 refills | Status: DC

## 2018-08-05 MED ORDER — PREDNISONE 20 MG PO TABS: ORAL_TABLET | ORAL | 0 refills | Status: DC

## 2018-08-05 NOTE — Progress Notes (Signed)
Tracy Jensen is a 63 y.o. female  Shoulder Pain: Patient complaints of right shoulder pain. This is evaluated as a personal injury. The pain is described as aching, dull and shooting.  The onset of the pain was several years ago and became worse over the last few weeks. She has been using her arm more frequently due to remodeling her home..  The pain occurs several times per day and when active and lasts 30 minutes or more.  Location is global. No history of dislocation. Symptoms are aggravated by all activities. Symptoms are diminisohed by  rest, medication: Tramadol used and beneficial, a joint injection by ortho in Nov 2019.   Limited activities include: reaching, lifting, pulling, pushing, carrying. mild stiffness, mild weakness, no swelling, no crepitus noted is reported. 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   Past Medical History:  Diagnosis Date  . Hypertension   . Migraines   . Sciatica   . TIA (transient ischemic attack)    History reviewed. No pertinent surgical history.  Current Outpatient Medications:  .  aspirin EC 81 MG tablet, Take 81 mg by mouth daily., Disp: , Rfl:  .  busPIRone (BUSPAR) 15 MG tablet, TAKE 1 TABLET BY MOUTH THREE TIMES DAILY (Patient taking differently: Take 15 mg by mouth 3 (three) times daily. ), Disp: 90 tablet, Rfl: 1 .  butalbital-acetaminophen-caffeine (FIORICET, ESGIC) 50-325-40 MG tablet, TAKE ONE TABLET BY MOUTH EVERY 8 HOURS. (Patient taking differently: Take 1 tablet by mouth every 8 (eight) hours. ), Disp: 10 tablet, Rfl: 5 .  cyclobenzaprine (FLEXERIL) 10 MG tablet, TAKE 1 TABLET BY MOUTH THREE TIMES DAILY (Patient taking differently: Take 10 mg by mouth 3 (three) times daily. ), Disp: 90 tablet, Rfl: 5 .  diclofenac (VOLTAREN) 75 MG EC tablet, TAKE 1 TABLET BY MOUTH TWICE DAILY, Disp: 60 tablet, Rfl: 0 .  fluticasone (FLONASE) 50 MCG/ACT nasal spray, Place 2 sprays into both nostrils daily., Disp: 16 g, Rfl: 6 .   lisinopril (PRINIVIL,ZESTRIL) 20 MG tablet, TAKE 1 TABLET BY MOUTH ONCE DAILY (Patient taking differently: Take 20 mg by mouth daily. ), Disp: 90 tablet, Rfl: 3 .  methocarbamol (ROBAXIN) 500 MG tablet, TAKE 1 TABLET BY MOUTH EVERY 8 HOURS AS NEEDED FOR MUSCLE SPASM, Disp: 90 tablet, Rfl: 0 .  polyethylene glycol (MIRALAX / GLYCOLAX) packet, Take 17 g by mouth daily as needed for mild constipation or moderate constipation., Disp: 14 each, Rfl: 0 .  traZODone (DESYREL) 50 MG tablet, Take 2 tablets (100 mg total) by mouth at bedtime., Disp: 60 tablet, Rfl: 5 .  naproxen (NAPROSYN) 500 MG tablet, Take 1 tablet (500 mg total) by mouth 2 (two) times daily with a meal for 14 days., Disp: 28 tablet, Rfl: 0 .  predniSONE (DELTASONE) 20 MG tablet, 2 po at sametime daily for 5 days- start tomorrow, Disp: 10 tablet, Rfl: 0 Allergies  Allergen Reactions  . Azithromycin Hives    Hives after Zpack    reports that she has never smoked. She has never used smokeless tobacco. She reports current alcohol use. She reports that she does not use drugs. History reviewed. No pertinent family history.  Shoulder: Inspection reveals no abnormalities, atrophy or asymmetry. Palpation causes slight tenderness over AC joint or bicipital groove. ROM is full in all planes. Rotator cuff strength normal throughout. No signs of impingement with negative Neer and Hawkin's tests, empty can. Speeds and Yergason's tests normal. No labral pathology  noted with negative Obrien's, negative clunk and good stability. Normal scapular function observed. No painful arc and no drop arm sign. No apprehension sign.  Assessment and Plan  Tracy Jensen was seen today for right shoulder pain.  Diagnoses and all orders for this visit:  Chronic right shoulder pain Encouraged to follow up with ortho as discussed at previous ortho visit. PT referral made. Medications as prescribed. Report any new or worsening symptoms.  -     methylPREDNISolone  acetate (DEPO-MEDROL) injection 40 mg -     naproxen (NAPROSYN) 500 MG tablet; Take 1 tablet (500 mg total) by mouth 2 (two) times daily with a meal for 14 days. -     Ambulatory referral to Physical Therapy -     predniSONE (DELTASONE) 20 MG tablet; 2 po at sametime daily for 5 days- start tomorrow -     methocarbamol (ROBAXIN) 500 MG tablet; TAKE 1 TABLET BY MOUTH EVERY 8 HOURS AS NEEDED FOR MUSCLE SPASM  Return in about 6 weeks (around 09/16/2018), or if symptoms worsen or fail to improve, for shoulder reevaluation.  The above assessment and management plan was discussed with the patient. The patient verbalized understanding of and has agreed to the management plan. Patient is aware to call the clinic if symptoms fail to improve or worsen. Patient is aware when to return to the clinic for a follow-up visit. Patient educated on when it is appropriate to go to the emergency department.   Kari Baars, FNP-C [facility] [address] [place], [place] [address] [phone]

## 2018-08-05 NOTE — Patient Instructions (Signed)
Shoulder Pain Many things can cause shoulder pain, including:  An injury.  Moving the shoulder in the same way again and again (overuse).  Joint pain (arthritis). Pain can come from:  Swelling and irritation (inflammation) of any part of the shoulder.  An injury to the shoulder joint.  An injury to: ? Tissues that connect muscle to bone (tendons). ? Tissues that connect bones to each other (ligaments). ? Bones. Follow these instructions at home: Watch for changes in your symptoms. Let your doctor know about them. Follow these instructions to help with your pain. If you have a sling:  Wear the sling as told by your doctor. Remove it only as told by your doctor.  Loosen the sling if your fingers: ? Tingle. ? Become numb. ? Turn cold and blue.  Keep the sling clean.  If the sling is not waterproof: ? Do not let it get wet. ? Take the sling off when you shower or bathe. Managing pain, stiffness, and swelling   If told, put ice on the painful area: ? Put ice in a plastic bag. ? Place a towel between your skin and the bag. ? Leave the ice on for 20 minutes, 2-3 times a day. Stop putting ice on if it does not help with the pain.  Squeeze a soft ball or a foam pad as much as possible. This prevents swelling in the shoulder. It also helps to strengthen the arm. General instructions  Take over-the-counter and prescription medicines only as told by your doctor.  Keep all follow-up visits as told by your doctor. This is important. Contact a doctor if:  Your pain gets worse.  Medicine does not help your pain.  You have new pain in your arm, hand, or fingers. Get help right away if:  Your arm, hand, or fingers: ? Tingle. ? Are numb. ? Are swollen. ? Are painful. ? Turn white or blue. Summary  Shoulder pain can be caused by many things. These include injury, moving the shoulder in the same away again and again, and joint pain.  Watch for changes in your symptoms.  Let your doctor know about them.  This condition may be treated with a sling, ice, and pain medicine.  Contact your doctor if the pain gets worse or you have new pain. Get help right away if your arm, hand, or fingers tingle or get numb, swollen, or painful.  Keep all follow-up visits as told by your doctor. This is important. This information is not intended to replace advice given to you by your health care provider. Make sure you discuss any questions you have with your health care provider. Document Released: 11/19/2007 Document Revised: 12/15/2017 Document Reviewed: 12/15/2017 Elsevier Interactive Patient Education  2019 [employer]  

## 2018-08-11 ENCOUNTER — Other Ambulatory Visit: Payer: Self-pay | Admitting: Physician Assistant

## 2018-08-11 DIAGNOSIS — F411 Generalized anxiety disorder: Secondary | ICD-10-CM

## 2018-08-16 ENCOUNTER — Other Ambulatory Visit: Payer: Self-pay

## 2018-08-16 ENCOUNTER — Ambulatory Visit: Payer: 59 | Attending: Family Medicine | Admitting: Physical Therapy

## 2018-08-16 ENCOUNTER — Encounter: Payer: Self-pay | Admitting: Physical Therapy

## 2018-08-16 DIAGNOSIS — M6281 Muscle weakness (generalized): Secondary | ICD-10-CM

## 2018-08-16 DIAGNOSIS — M25551 Pain in right hip: Secondary | ICD-10-CM | POA: Insufficient documentation

## 2018-08-16 DIAGNOSIS — G8929 Other chronic pain: Secondary | ICD-10-CM

## 2018-08-16 DIAGNOSIS — M25511 Pain in right shoulder: Secondary | ICD-10-CM | POA: Diagnosis not present

## 2018-08-16 NOTE — Therapy (Signed)
[facility] [address] [place], [place], [address] Phone: [phone]   Fax:  [phone]  Physical Therapy Evaluation  Patient Details  Name: Tracy Jensen MRN: [id number] Date of Birth: December 25, 1955 Referring Provider (PT): Gilford Silvius, FNP   Encounter Date: 08/16/2018  PT End of Session - 08/16/18 0955    Visit Number  1    Number of Visits  12    Date for PT Re-Evaluation  10/04/18    Authorization Type  FOTO; Progress note every 10th visit    PT Start Time  0900    PT Stop Time  0946    PT Time Calculation (min)  46 min    Activity Tolerance  Patient tolerated treatment well    Behavior During Therapy  [facility] for tasks assessed/performed       Past Medical History:  Diagnosis Date  . Hypertension   . Migraines   . Sciatica   . TIA (transient ischemic attack)     History reviewed. No pertinent surgical history.  There were no vitals filed for this visit.   Subjective Assessment - 08/16/18 1258    Subjective  Patient arrives to physical therapy with reports of chronic right shoulder pain that started August 2019. Patient states she mas most difficulties at work with overhead reaching. Patient reports her pain increases at the end of the day and she has difficulties with staying asleep due to pain. Patient reported having an injection in November 2019 to which improved her symptoms, but pain has returned. Patient reports pain at worst is 8/10 and pain at best is 4/10. Patient's goals are to decrease pain, improve use of right arm and be able to work and care for her coming grandchild without right shoulder pain.    Pertinent History  HTN    Limitations  Lifting;House hold activities;Other (comment)   work activities   Diagnostic tests  x-ray: normal    Patient Stated Goals  use arm without pain, work without pain     Currently in Pain?  Yes    Pain Score  5     Pain Location  Shoulder    Pain Orientation  Right    Pain  Descriptors / Indicators  Sore;Aching    Pain Type  Chronic pain    Pain Onset  More than a month ago    Pain Frequency  Intermittent    Aggravating Factors   "moving arm in a certain way, lifting at work & home"    Pain Relieving Factors  heat, medication    Effect of Pain on Daily Activities  "hurts more at night, wakes me up."         [facility] PT Assessment - 08/16/18 0001      Assessment   Medical Diagnosis  chronic right shoulder pain    Referring Provider (PT)  Gilford Silvius, FNP    Onset Date/Surgical Date  --   August 2019   Hand Dominance  Right    Next MD Visit  N/A    Prior Therapy  no      Precautions   Precautions  None      Restrictions   Weight Bearing Restrictions  No      Balance Screen   Has the patient fallen in the past 6 months  No    Has the patient had a decrease in activity level because of a fear of falling?   No    Is the patient reluctant  to leave their home because of a fear of falling?   No      Home Public house manager residence    Living Arrangements  Spouse/significant other;Children      Prior Function   Level of Independence  Independent      Observation/Other Assessments   Focus on Therapeutic Outcomes (FOTO)   46% limited      Posture/Postural Control   Posture/Postural Control  Postural limitations    Postural Limitations  Forward head;Rounded Shoulders      ROM / Strength   AROM / PROM / Strength  AROM;PROM;Strength      AROM   Overall AROM   Due to pain    AROM Assessment Site  Shoulder    Right/Left Shoulder  Right    Right Shoulder Flexion  151 Degrees    Right Shoulder ABduction  140 Degrees    Right Shoulder Internal Rotation  70 Degrees    Right Shoulder External Rotation  70 Degrees      PROM   Overall PROM   Within functional limits for tasks performed      Strength   Overall Strength  Due to pain    Strength Assessment Site  Shoulder    Right/Left Shoulder  Right      Palpation    Palpation comment  tenderness to palpation to right bicipital groove, middle deltoid and posterior shoulder.      Special Tests    Special Tests  Rotator Cuff Impingement;Biceps/Labral Tests    Other special tests  (+) cross body adduction test    Rotator Cuff Impingment tests  Leanord Asal test;other    Biceps/Labral tests  O' Brien's Test      Hawkins-Kennedy test   Findings  Negative    Side  Right      O'Brien's Test   Findings  Negative    Side  Right      Transfers   Transfers  Independent with all Transfers                Objective measurements completed on examination: See above findings.              PT Education - 08/16/18 1257    Education Details  scapular retractions, corner stretch, chin tuck (needs more instruction)    Person(s) Educated  Patient    Methods  Explanation;Demonstration;Handout    Comprehension  Verbalized understanding;Returned demonstration;Verbal cues required;Need further instruction                  Plan - 08/16/18 1255    Clinical Impression Statement  Patient is a 63 year old female who presents to physical therapy with decreased right shoulder AROM, and decreased right shoulder MMT. Patient tender to palpation to right bicipital groove, right middle biceps, and posterior shoulder. Patient denies numbness tingling, or changes to sensation. Patient noted with rounded shoulders and forward head. (-) right [employer] Test, (+) right cross body adduction test. Patient and PT discussed finding time to allow shoulder to heal as well as modifying some activities with use of left arm. Patient reported understanding.  Patient would benefit from skilled physical therapy to address deficits and address patient's goals.     Personal Factors and Comorbidities  Comorbidity 1;Time since onset of injury/illness/exacerbation;Profession    Examination-Activity Limitations  Carry;Reach Overhead;Lift    Stability/Clinical  Decision Making  Stable/Uncomplicated    Clinical Decision Making  Low  Rehab Potential  Good    PT Frequency  2x / week    PT Duration  6 weeks    PT Treatment/Interventions  Cryotherapy;ADLs/Self Care Home Management;Electrical Stimulation;Iontophoresis 4mg /ml Dexamethasone;Moist Heat;Ultrasound;Therapeutic activities;Therapeutic exercise;Manual techniques;Dry needling;Passive range of motion;Vasopneumatic Device;Taping;Patient/family education    PT Next Visit Plan  UBE, postural exercises, gentle right shoulder strengthening, modalities PRN for pain relief.    PT Home Exercise Plan  see patient education section    Consulted and Agree with Plan of Care  Patient       Patient will benefit from skilled therapeutic intervention in order to improve the following deficits and impairments:  Pain, Impaired UE functional use, Decreased strength, Decreased range of motion, Postural dysfunction  Visit Diagnosis: Chronic right shoulder pain - Plan: PT plan of care cert/re-cert  Muscle weakness (generalized) - Plan: PT plan of care cert/re-cert     Problem List Patient Active Problem List   Diagnosis Date Noted  . Chronic right shoulder pain 08/05/2018  . Abdominal pain 07/10/2018  . Chronic right-sided low back pain with right-sided sciatica 10/10/2017  . Acute recurrent frontal sinusitis 07/03/2017  . Olecranon bursitis of right elbow 07/14/2016  . Migraine 07/14/2016  . Sexual dysfunction 07/14/2016  . History of CVA (cerebrovascular accident) 04/08/2016  . HTN (hypertension), benign 04/08/2016  . Depression 04/08/2016    Guss Bunde, PT, DPT 08/16/2018, 2:31 PM  [facility] [address] [place], [place], [address] Phone: [phone]   Fax:  [phone]  Name: Tracy Jensen MRN: [id number] Date of Birth: Sep 01, 1955

## 2018-08-17 ENCOUNTER — Ambulatory Visit: Payer: 59 | Admitting: *Deleted

## 2018-08-17 DIAGNOSIS — M6281 Muscle weakness (generalized): Secondary | ICD-10-CM

## 2018-08-17 DIAGNOSIS — M25511 Pain in right shoulder: Principal | ICD-10-CM

## 2018-08-17 DIAGNOSIS — G8929 Other chronic pain: Secondary | ICD-10-CM

## 2018-08-17 NOTE — Therapy (Signed)
[facility] [address] [place], [place], [address] Phone: [phone]   Fax:  [phone]  Physical Therapy Treatment  Patient Details  Name: Tracy Jensen MRN: [id number] Date of Birth: [birth year] Referring Provider (PT): Gilford Silvius, FNP   Encounter Date: 08/17/2018  PT End of Session - 08/17/18 9038    Visit Number  2    Number of Visits  12    Date for PT Re-Evaluation  10/04/18    Authorization Type  FOTO; Progress note every 10th visit    PT Start Time  0815    PT Stop Time  0902    PT Time Calculation (min)  47 min       Past Medical History:  Diagnosis Date  . Hypertension   . Migraines   . Sciatica   . TIA (transient ischemic attack)     No past surgical history on file.  There were no vitals filed for this visit.  Subjective Assessment - 08/17/18 0821    Subjective  RT shldr  is stiff     Pertinent History  HTN    Limitations  Lifting;House hold activities;Other (comment)    Diagnostic tests  x-ray: normal    Patient Stated Goals  use arm without pain, work without pain     Currently in Pain?  Yes    Pain Score  4     Pain Location  Shoulder    Pain Orientation  Right    Pain Descriptors / Indicators  Aching;Sore    Pain Onset  More than a month ago                       [facility] Adult PT Treatment/Exercise - 08/17/18 0001      Exercises   Exercises  Shoulder      Shoulder Exercises: Standing   Protraction  Strengthening;Right;20 reps;Theraband    Theraband Level (Shoulder Protraction)  Level 1 (Yellow)    External Rotation  Strengthening;Right;20 reps;Theraband    Theraband Level (Shoulder External Rotation)  Level 1 (Yellow)    Internal Rotation  Strengthening;Right;20 reps;Theraband    Theraband Level (Shoulder Internal Rotation)  Level 1 (Yellow)    Row  Strengthening;Right;20 reps;Theraband    Theraband Level (Shoulder Row)  Level 1 (Yellow)      Shoulder Exercises:  ROM/Strengthening   UBE (Upper Arm Bike)  90 RPMs x 5 mins      Modalities   Modalities  Ultrasound      Ultrasound   Ultrasound Location  RT shldr    Ultrasound Parameters  Combo 1.5 w/ cm2 x 12 mins    Ultrasound Goals  Pain      Manual Therapy   Manual Therapy  Soft tissue mobilization;Myofascial release    Soft tissue mobilization  STW to RT shldr in sitting. Notable tenderness along Bicep tendon             PT Education - 08/17/18 0929    Education Details  RW4 with yellow tubing    Person(s) Educated  Patient    Methods  Explanation;Demonstration;Verbal cues;Tactile cues;Handout    Comprehension  Verbalized understanding;Returned demonstration                 Plan - 08/17/18 3338    Clinical Impression Statement  Pt arrrived todaymainly with c/o tightness and soreness in RT shldr. She did well with Therex and RW4 and yellow tband was issued for HEP. [place] combo performed  f/b STW and tolerated well.    Personal Factors and Comorbidities  Comorbidity 1;Time since onset of injury/illness/exacerbation;Profession    Examination-Activity Limitations  Carry;Reach Overhead;Lift    Stability/Clinical Decision Making  Stable/Uncomplicated    Rehab Potential  Good    PT Frequency  2x / week    PT Duration  6 weeks    PT Treatment/Interventions  Cryotherapy;ADLs/Self Care Home Management;Electrical Stimulation;Iontophoresis 4mg /ml Dexamethasone;Moist Heat;Ultrasound;Therapeutic activities;Therapeutic exercise;Manual techniques;Dry needling;Passive range of motion;Vasopneumatic Device;Taping;Patient/family education    PT Next Visit Plan  UBE, postural exercises, gentle right shoulder strengthening, modalities PRN for pain relief.    PT Home Exercise Plan  see patient education section    Consulted and Agree with Plan of Care  Patient       Patient will benefit from skilled therapeutic intervention in order to improve the following deficits and impairments:  Pain,  Impaired UE functional use, Decreased strength, Decreased range of motion, Postural dysfunction  Visit Diagnosis: Chronic right shoulder pain  Muscle weakness (generalized)     Problem List Patient Active Problem List   Diagnosis Date Noted  . Chronic right shoulder pain 08/05/2018  . Abdominal pain 07/10/2018  . Chronic right-sided low back pain with right-sided sciatica 10/10/2017  . Acute recurrent frontal sinusitis 07/03/2017  . Olecranon bursitis of right elbow 07/14/2016  . Migraine 07/14/2016  . Sexual dysfunction 07/14/2016  . History of CVA (cerebrovascular accident) 04/08/2016  . HTN (hypertension), benign 04/08/2016  . Depression 04/08/2016    RAMSEUR,CHRIS , PTA 08/17/2018, 9:44 AM  [facility] [address] [place], [place], [address] Phone: [phone]   Fax:  [phone]  Name: Tracy Jensen MRN: [id number] Date of Birth: 28-Sep-1955

## 2018-08-19 ENCOUNTER — Ambulatory Visit: Payer: 59 | Admitting: *Deleted

## 2018-08-19 DIAGNOSIS — M25511 Pain in right shoulder: Secondary | ICD-10-CM | POA: Diagnosis not present

## 2018-08-19 DIAGNOSIS — G8929 Other chronic pain: Secondary | ICD-10-CM

## 2018-08-19 DIAGNOSIS — M25551 Pain in right hip: Secondary | ICD-10-CM

## 2018-08-19 DIAGNOSIS — M6281 Muscle weakness (generalized): Secondary | ICD-10-CM

## 2018-08-19 NOTE — Therapy (Signed)
[facility] [address] [place], [place], [address] Phone: [phone]   Fax:  [phone]  Physical Therapy Treatment  Patient Details  Name: Tracy Jensen MRN: [id number] Date of Birth: 1955-12-23 Referring Provider (PT): Gilford Silvius, FNP   Encounter Date: 08/19/2018  PT End of Session - 08/19/18 0907    Visit Number  3    Number of Visits  12    Date for PT Re-Evaluation  10/04/18    Authorization Type  FOTO; Progress note every 10th visit    PT Start Time  0815    PT Stop Time  0905    PT Time Calculation (min)  50 min       Past Medical History:  Diagnosis Date  . Hypertension   . Migraines   . Sciatica   . TIA (transient ischemic attack)     No past surgical history on file.  There were no vitals filed for this visit.  Subjective Assessment - 08/19/18 0820    Subjective  Upper arm is sore today    Pertinent History  HTN    Limitations  Lifting;House hold activities;Other (comment)    Diagnostic tests  x-ray: normal    Patient Stated Goals  use arm without pain, work without pain     Currently in Pain?  Yes    Pain Score  4     Pain Location  Shoulder    Pain Orientation  Right    Pain Descriptors / Indicators  Aching;Sore    Pain Type  Chronic pain    Pain Frequency  Intermittent                       [facility] Adult PT Treatment/Exercise - 08/19/18 0001      Exercises   Exercises  Shoulder      Shoulder Exercises: Standing   Protraction  Strengthening;Right;20 reps;Theraband    Theraband Level (Shoulder Protraction)  Level 1 (Yellow)    External Rotation  Strengthening;Right;20 reps;Theraband    Theraband Level (Shoulder External Rotation)  Level 1 (Yellow)    Internal Rotation  Strengthening;Right;20 reps;Theraband    Theraband Level (Shoulder Internal Rotation)  Level 1 (Yellow)    Row  Strengthening;Right;20 reps;Theraband    Theraband Level (Shoulder Row)  Level 1 (Yellow)       Shoulder Exercises: ROM/Strengthening   UBE (Upper Arm Bike)  90 RPMs x 6 mins      Modalities   Modalities  Ultrasound      Ultrasound   Ultrasound Location  RT shldr    Ultrasound Parameters  Combo 1.5 w/cm2 x 12 mins    Ultrasound Goals  Pain      Manual Therapy   Manual Therapy  Soft tissue mobilization;Myofascial release    Soft tissue mobilization  STW to RT shldr in sitting. Notable tenderness along Bicep tendon                         Plan - 08/19/18 0830    Clinical Impression Statement  Pt arrived today doing fairly well, but complains of being sore in RT arm. She reports that she still uses RT arm a lot with ADLs. She did well with therex, but continues to need verbal and tactile cues for technique.    Personal Factors and Comorbidities  Comorbidity 1;Time since onset of injury/illness/exacerbation;Profession    Examination-Activity Limitations  Carry;Reach Overhead;Lift    Stability/Clinical Decision  Making  Stable/Uncomplicated    Rehab Potential  Good    PT Frequency  2x / week    PT Duration  6 weeks    PT Treatment/Interventions  Cryotherapy;ADLs/Self Care Home Management;Electrical Stimulation;Iontophoresis 4mg /ml Dexamethasone;Moist Heat;Ultrasound;Therapeutic activities;Therapeutic exercise;Manual techniques;Dry needling;Passive range of motion;Vasopneumatic Device;Taping;Patient/family education    PT Next Visit Plan  UBE, postural exercises, gentle right shoulder strengthening, modalities PRN for pain relief.    PT Home Exercise Plan  see patient education section    Consulted and Agree with Plan of Care  Patient       Patient will benefit from skilled therapeutic intervention in order to improve the following deficits and impairments:  Pain, Impaired UE functional use, Decreased strength, Decreased range of motion, Postural dysfunction  Visit Diagnosis: Chronic right shoulder pain  Muscle weakness (generalized)  Pain in right  hip     Problem List Patient Active Problem List   Diagnosis Date Noted  . Chronic right shoulder pain 08/05/2018  . Abdominal pain 07/10/2018  . Chronic right-sided low back pain with right-sided sciatica 10/10/2017  . Acute recurrent frontal sinusitis 07/03/2017  . Olecranon bursitis of right elbow 07/14/2016  . Migraine 07/14/2016  . Sexual dysfunction 07/14/2016  . History of CVA (cerebrovascular accident) 04/08/2016  . HTN (hypertension), benign 04/08/2016  . Depression 04/08/2016    RAMSEUR,CHRIS, PTA 08/19/2018, 9:19 AM  [facility] [address] [place], [place], [address] Phone: [phone]   Fax:  [phone]  Name: Tracy Jensen MRN: [id number] Date of Birth: 07-31-1955

## 2018-08-24 ENCOUNTER — Ambulatory Visit: Payer: 59 | Admitting: Physical Therapy

## 2018-08-24 DIAGNOSIS — M25511 Pain in right shoulder: Principal | ICD-10-CM

## 2018-08-24 DIAGNOSIS — M6281 Muscle weakness (generalized): Secondary | ICD-10-CM

## 2018-08-24 DIAGNOSIS — G8929 Other chronic pain: Secondary | ICD-10-CM

## 2018-08-24 NOTE — Therapy (Signed)
[facility] [address] [place], [place], [address] Phone: [phone]   Fax:  [phone]  Physical Therapy Treatment  Patient Details  Name: Tracy Jensen MRN: [id number] Date of Birth: 05/02/1956 Referring Provider (PT): Gilford Silvius, FNP   Encounter Date: 08/24/2018  PT End of Session - 08/24/18 8850    Visit Number  4    Number of Visits  12    Date for PT Re-Evaluation  10/04/18    Authorization Type  FOTO; Progress note every 10th visit    PT Start Time  0816    PT Stop Time  0858    PT Time Calculation (min)  42 min    Activity Tolerance  Patient tolerated treatment well    Behavior During Therapy  [facility] for tasks assessed/performed       Past Medical History:  Diagnosis Date  . Hypertension   . Migraines   . Sciatica   . TIA (transient ischemic attack)     No past surgical history on file.  There were no vitals filed for this visit.  Subjective Assessment - 08/24/18 0812    Subjective  Reports continued high level pain and has been considering calling the orthopedic to get an injection. Reports pain with movement.    Pertinent History  HTN    Limitations  Lifting;House hold activities;Other (comment)    Diagnostic tests  x-ray: normal    Patient Stated Goals  use arm without pain, work without pain     Currently in Pain?  Yes    Pain Score  8     Pain Location  Shoulder    Pain Orientation  Right    Pain Descriptors / Indicators  Discomfort    Pain Type  Chronic pain    Pain Onset  More than a month ago    Pain Frequency  Intermittent         [facility] PT Assessment - 08/24/18 0001      Assessment   Medical Diagnosis  chronic right shoulder pain    Referring Provider (PT)  Gilford Silvius, FNP    Hand Dominance  Right    Next MD Visit  N/A    Prior Therapy  no      Precautions   Precautions  None      Restrictions   Weight Bearing Restrictions  No                   [facility] Adult PT  Treatment/Exercise - 08/24/18 0001      Shoulder Exercises: Standing   Protraction  Strengthening;Right;20 reps;Theraband    Theraband Level (Shoulder Protraction)  Level 1 (Yellow)    External Rotation  Strengthening;Right;20 reps;Theraband    Theraband Level (Shoulder External Rotation)  Level 1 (Yellow)    Internal Rotation  Strengthening;Right;20 reps;Theraband    Theraband Level (Shoulder Internal Rotation)  Level 1 (Yellow)    Extension  Strengthening;Right;20 reps;Theraband    Theraband Level (Shoulder Extension)  Level 1 (Yellow)    Row  Strengthening;Right;20 reps;Theraband    Theraband Level (Shoulder Row)  Level 1 (Yellow)      Shoulder Exercises: ROM/Strengthening   UBE (Upper Arm Bike)  90 RPMs x 8 mins    Wall Wash  into flexion x10 reps      Modalities   Modalities  Ultrasound      Ultrasound   Ultrasound Location  R proximal bicep, AC joint    Ultrasound Parameters  1.2 w/cm2,  100%, 1 mhz x10 min    Ultrasound Goals  Pain      Manual Therapy   Manual Therapy  Joint mobilization;Passive ROM    Joint Mobilization  G I-II glenohumeral joint mobs into A/P/I directions    Passive ROM  PROM of R shoulder into flexion, ER, IR with gentle holds at end range             PT Education - 08/24/18 0901    Education Details  HEP- corner stretch, wall wash into flexion    Person(s) Educated  Patient    Methods  Explanation;Demonstration;Handout    Comprehension  Verbalized understanding                 Plan - 08/24/18 0903    Clinical Impression Statement  Patient presented in clinic with continued high level pain in R shoulder. Patient considering contacting orthopedics office to recieve injection. Patient progressed through light resistance and ROM exercises with no complaints of any current pain. Grade I-II R shoulder glenohumeral joint mobilizations completed today in A/P/I directions followed by PROM in all directions to improve joint mobility. Normal  [place] response noted to R AC joint and proximal bicep. New HEP provided with ROM and stretches with patient verbalizing understanding of instructions.    Personal Factors and Comorbidities  Comorbidity 1;Time since onset of injury/illness/exacerbation;Profession;Fitness    Physiological scientist;Reach Overhead;Lift    Stability/Clinical Decision Making  Stable/Uncomplicated    Rehab Potential  Good    PT Frequency  2x / week    PT Duration  6 weeks    PT Treatment/Interventions  Cryotherapy;ADLs/Self Care Home Management;Electrical Stimulation;Iontophoresis 4mg /ml Dexamethasone;Moist Heat;Ultrasound;Therapeutic activities;Therapeutic exercise;Manual techniques;Dry needling;Passive range of motion;Vasopneumatic Device;Taping;Patient/family education    PT Next Visit Plan  UBE, postural exercises, gentle right shoulder strengthening, modalities PRN for pain relief.    PT Home Exercise Plan  see patient education section    Consulted and Agree with Plan of Care  Patient       Patient will benefit from skilled therapeutic intervention in order to improve the following deficits and impairments:  Pain, Impaired UE functional use, Decreased strength, Decreased range of motion, Postural dysfunction  Visit Diagnosis: Chronic right shoulder pain  Muscle weakness (generalized)     Problem List Patient Active Problem List   Diagnosis Date Noted  . Chronic right shoulder pain 08/05/2018  . Abdominal pain 07/10/2018  . Chronic right-sided low back pain with right-sided sciatica 10/10/2017  . Acute recurrent frontal sinusitis 07/03/2017  . Olecranon bursitis of right elbow 07/14/2016  . Migraine 07/14/2016  . Sexual dysfunction 07/14/2016  . History of CVA (cerebrovascular accident) 04/08/2016  . HTN (hypertension), benign 04/08/2016  . Depression 04/08/2016    Marvell Fuller, PTA 08/24/2018, 9:22 AM  [facility] [address] [place], [place], [address] Phone: [phone]   Fax:  [phone]  Name: Tracy Jensen MRN: [id number] Date of Birth: 09-25-1955

## 2018-08-26 ENCOUNTER — Other Ambulatory Visit: Payer: Self-pay

## 2018-08-26 ENCOUNTER — Ambulatory Visit: Payer: 59 | Admitting: Physical Therapy

## 2018-08-26 ENCOUNTER — Encounter: Payer: Self-pay | Admitting: Physical Therapy

## 2018-08-26 DIAGNOSIS — M25511 Pain in right shoulder: Principal | ICD-10-CM

## 2018-08-26 DIAGNOSIS — G8929 Other chronic pain: Secondary | ICD-10-CM

## 2018-08-26 DIAGNOSIS — M6281 Muscle weakness (generalized): Secondary | ICD-10-CM

## 2018-08-26 NOTE — Therapy (Addendum)
[facility] [place], [place], [address] Phone: [phone]   Fax:  [phone]  Physical Therapy Treatment PHYSICAL THERAPY DISCHARGE SUMMARY  Visits from Start of Care: 5  Current functional level related to goals / functional outcomes: See below   Remaining deficits: See goals   Education / Equipment: HEP Plan: Patient agrees to discharge.  Patient goals were not met. Patient is being discharged due to the patient's request.  ?????   Patient to see MD for  Followup. [name], PT, DPT   Patient Details  Name: BLIA TOTMAN MRN: [id number] Date of Birth: [birth year] Referring Provider (PT): Darla Lesches, FNP   Encounter Date: 08/26/2018  PT End of Session - 08/26/18 1018    Visit Number  5    Number of Visits  12    Date for PT Re-Evaluation  10/04/18    Authorization Type  FOTO; Progress note every 10th visit    PT Start Time  0816    PT Stop Time  0908    PT Time Calculation (min)  52 min    Activity Tolerance  Patient tolerated treatment well    Behavior During Therapy  [facility] for tasks assessed/performed       Past Medical History:  Diagnosis Date  . Hypertension   . Migraines   . Sciatica   . TIA (transient ischemic attack)     History reviewed. No pertinent surgical history.  There were no vitals filed for this visit.  Subjective Assessment - 08/26/18 1012    Subjective  Patient reports she did a lot yesterday at work during her 9 hour shift. She has an appointment with the orthopedic on Tuesday, 3/17    Pertinent History  HTN    Limitations  Lifting;House hold activities;Other (comment)    Diagnostic tests  x-ray: normal    Patient Stated Goals  use arm without pain, work without pain     Currently in Pain?  Yes    Pain Score  7     Pain Location  Shoulder    Pain Orientation  Right    Pain Descriptors / Indicators  Discomfort;Sore    Pain Type  Chronic pain    Pain Onset  More than  a month ago    Pain Frequency  Intermittent         [facility] PT Assessment - 08/26/18 0001      Assessment   Medical Diagnosis  chronic right shoulder pain    Referring Provider (PT)  Darla Lesches, FNP    Hand Dominance  Right    Next MD Visit  N/A    Prior Therapy  no      Precautions   Precautions  None      Restrictions   Weight Bearing Restrictions  No                   [facility] Adult PT Treatment/Exercise - 08/26/18 0001      Shoulder Exercises: Standing   Protraction  Strengthening;Right;20 reps;Theraband    Theraband Level (Shoulder Protraction)  Level 1 (Yellow)    External Rotation  Strengthening;Right;20 reps;Theraband    Theraband Level (Shoulder External Rotation)  Level 1 (Yellow)    Internal Rotation  Strengthening;Right;20 reps;Theraband    Theraband Level (Shoulder Internal Rotation)  Level 1 (Yellow)    Extension  Strengthening;Right;20 reps;Theraband    Theraband Level (Shoulder Extension)  Level 1 (Yellow)    Row  Strengthening;Right;20 reps;Theraband  Theraband Level (Shoulder Row)  Level 1 (Yellow)      Shoulder Exercises: ROM/Strengthening   UBE (Upper Arm Bike)  90 RPMs x 8 mins      Modalities   Modalities  Electrical Stimulation;Vasopneumatic      Electrical Stimulation   Electrical Stimulation Location  right anterior shoulder    Electrical Stimulation Action  IFC    Electrical Stimulation Parameters  80-150 hz x15 mins    Electrical Stimulation Goals  Pain      Vasopneumatic   Number Minutes Vasopneumatic   15 minutes    Vasopnuematic Location   Shoulder    Vasopneumatic Pressure  Low      Manual Therapy   Manual Therapy  Soft tissue mobilization    Soft tissue mobilization  STW to RT shldr in sitting. Notable tenderness along Bicep tendon                         Plan - 08/26/18 1018    Clinical Impression Statement  Patient was able to tolerate treatment fairly and reported ongoing sores throughout TEs.  Patient reported [place] and US/e-stim combo has provided limited relief. STW/M performed to anterior shoulder and bicipital groove region to decrease pain and tenderness. Vasopneumatic device trialed today to which she reported a good response. Normal response to modalites upon removal.     Personal Factors and Comorbidities  Comorbidity 1;Time since onset of injury/illness/exacerbation;Profession;Fitness    Oceanographer;Reach Overhead;Lift    Stability/Clinical Decision Making  Stable/Uncomplicated    Clinical Decision Making  Low    Rehab Potential  Good    PT Frequency  2x / week    PT Duration  6 weeks    PT Treatment/Interventions  Cryotherapy;ADLs/Self Care Home Management;Electrical Stimulation;Iontophoresis 30m/ml Dexamethasone;Moist Heat;Ultrasound;Therapeutic activities;Therapeutic exercise;Manual techniques;Dry needling;Passive range of motion;Vasopneumatic Device;Taping;Patient/family education    PT Next Visit Plan  UBE, postural exercises, gentle right shoulder strengthening, modalities PRN for pain relief.    Consulted and Agree with Plan of Care  Patient       Patient will benefit from skilled therapeutic intervention in order to improve the following deficits and impairments:  Pain, Impaired UE functional use, Decreased strength, Decreased range of motion, Postural dysfunction  Visit Diagnosis: Chronic right shoulder pain  Muscle weakness (generalized)     Problem List Patient Active Problem List   Diagnosis Date Noted  . Chronic right shoulder pain 08/05/2018  . Abdominal pain 07/10/2018  . Chronic right-sided low back pain with right-sided sciatica 10/10/2017  . Acute recurrent frontal sinusitis 07/03/2017  . Olecranon bursitis of right elbow 07/14/2016  . Migraine 07/14/2016  . Sexual dysfunction 07/14/2016  . History of CVA (cerebrovascular accident) 04/08/2016  . HTN (hypertension), benign 04/08/2016  . Depression 04/08/2016     KGabriela Eves PT, DPT 08/26/2018, 1:15 PM  [facility] [address]Phone: [phone]  Fax:  [phone] Name: LKAISLYN GULASMRN: [id number] of Birth: 512-17-1957

## 2018-08-28 ENCOUNTER — Other Ambulatory Visit: Payer: Self-pay | Admitting: Physician Assistant

## 2018-08-31 ENCOUNTER — Other Ambulatory Visit: Payer: Self-pay

## 2018-08-31 ENCOUNTER — Encounter (INDEPENDENT_AMBULATORY_CARE_PROVIDER_SITE_OTHER): Payer: Self-pay | Admitting: Orthopaedic Surgery

## 2018-08-31 ENCOUNTER — Ambulatory Visit: Payer: 59 | Admitting: Physical Therapy

## 2018-08-31 ENCOUNTER — Ambulatory Visit (INDEPENDENT_AMBULATORY_CARE_PROVIDER_SITE_OTHER): Payer: 59 | Admitting: Orthopaedic Surgery

## 2018-08-31 VITALS — BP 107/73 | HR 87 | Ht <= 58 in | Wt 110.0 lb

## 2018-08-31 DIAGNOSIS — M25511 Pain in right shoulder: Secondary | ICD-10-CM

## 2018-08-31 DIAGNOSIS — G8929 Other chronic pain: Secondary | ICD-10-CM

## 2018-08-31 DIAGNOSIS — M7541 Impingement syndrome of right shoulder: Secondary | ICD-10-CM | POA: Diagnosis not present

## 2018-08-31 MED ORDER — LIDOCAINE HCL 2 % IJ SOLN
2.0000 mL | INTRAMUSCULAR | Status: AC | PRN
  Administered 2018-08-31: 2 mL

## 2018-08-31 MED ORDER — BUPIVACAINE HCL 0.5 % IJ SOLN
2.0000 mL | INTRAMUSCULAR | Status: AC | PRN
  Administered 2018-08-31: 2 mL via INTRA_ARTICULAR

## 2018-08-31 MED ORDER — METHYLPREDNISOLONE ACETATE 40 MG/ML IJ SUSP
80.0000 mg | INTRAMUSCULAR | Status: AC | PRN
  Administered 2018-08-31: 80 mg via INTRA_ARTICULAR

## 2018-08-31 NOTE — Progress Notes (Signed)
Office Visit Note   Patient: Tracy Jensen           Date of Birth: 05-Sep-1955           MRN: [id number] Visit Date: 08/31/2018              Requested by: Remus Loffler, PA-C [address] [place], [place] [address] PCP: Remus Loffler, PA-C   Assessment & Plan: Visit Diagnoses:  1. Chronic right shoulder pain     Plan: Recurrent impingement syndrome right shoulder.  Will inject the subacromial space with cortisone and ask the insurance company to pre-certify MRI scan with the possibility of a rotator cuff tear.  [name] is had recurrent pain to the point of compromise.  She had a cortisone injection with temporary relief in November.  She hss been seen by her primary care physician recently with a course of prednisone.  Her pain is been present off and on since August of last year without injury or trauma.  She has been through a course of outpatient rehab and is taking diclofenac  Follow-Up Instructions: No follow-ups on file.   Orders:  Orders Placed This Encounter  Procedures  . Large Joint Inj: R subacromial bursa   No orders of the defined types were placed in this encounter.     Procedures: Large Joint Inj: R subacromial bursa on 08/31/2018 2:14 PM Indications: pain and diagnostic evaluation Details: 25 G 1.5 in needle, anterolateral approach  Arthrogram: No  Medications: 2 mL lidocaine 2 %; 2 mL bupivacaine 0.5 %; 80 mg methylPREDNISolone acetate 40 MG/ML Consent was given by the patient. Immediately prior to procedure a time out was called to verify the correct patient, procedure, equipment, support staff and site/side marked as required. Patient was prepped and draped in the usual sterile fashion.       Clinical Data: No additional findings.   Subjective: Chief Complaint  Patient presents with  . Right Shoulder - Pain  Patient presents today for a follow up on her right shoulder pain. She was last here in November of 2019 and received a cortisone  injection. She said that it started hurting again in February. She saw her PCP in February and did a prednisone dose pak and tried therapy. She feels like she is not improving and wants to get a cortisone injection today. History of right shoulder pain since about August of last year.  She has had cortisone injections initially through her primary care physician's office and then by me in November.  Also recently has had prednisone per her primary care physician.  On each occasion she feels some relief only to have it recur.  She is on diclofenac.  Pain is localized to the right shoulder but tickly with overhead activity.  Prior films were negative for any acute changes  HPI  Review of Systems   Objective: Vital Signs: BP 107/73   Pulse 87   Ht 4\' 9"  (1.448 m)   Wt 110 lb (49.9 kg)   BMI 23.80 kg/m   Physical Exam Constitutional:      Appearance: She is well-developed.  Eyes:     Pupils: Pupils are equal, round, and reactive to light.  Pulmonary:     Effort: Pulmonary effort is normal.  Skin:    General: Skin is warm and dry.  Neurological:     Mental Status: She is alert and oriented to person, place, and time.  Psychiatric:  Behavior: Behavior normal.     Ortho Exam awake alert and oriented x3.  Comfortable sitting.  Positive impingement on the extremes of internal and external rotation right shoulder.  No popping or clicking.  Able to place her right arm fully over her head.  Good grip and good release.  Biceps appears to be intact.  No pain at the [facility] joint.  Positive empty can test.  Good strength  Specialty Comments:  No specialty comments available.  Imaging: No results found.   PMFS History: Patient Active Problem List   Diagnosis Date Noted  . Chronic right shoulder pain 08/05/2018  . Abdominal pain 07/10/2018  . Chronic right-sided low back pain with right-sided sciatica 10/10/2017  . Acute recurrent frontal sinusitis 07/03/2017  . Olecranon bursitis of  right elbow 07/14/2016  . Migraine 07/14/2016  . Sexual dysfunction 07/14/2016  . History of CVA (cerebrovascular accident) 04/08/2016  . HTN (hypertension), benign 04/08/2016  . Depression 04/08/2016   Past Medical History:  Diagnosis Date  . Hypertension   . Migraines   . Sciatica   . TIA (transient ischemic attack)     History reviewed. No pertinent family history.  History reviewed. No pertinent surgical history. Social History   Occupational History  . Not on file  Tobacco Use  . Smoking status: Never Smoker  . Smokeless tobacco: Never Used  Substance and Sexual Activity  . Alcohol use: Yes    Comment: occ  . Drug use: No  . Sexual activity: Not on file

## 2018-08-31 NOTE — Addendum Note (Signed)
Addended by: Wendi Maya on: 08/31/2018 02:23 PM   Modules accepted: Orders

## 2018-09-02 ENCOUNTER — Ambulatory Visit: Payer: 59 | Admitting: *Deleted

## 2018-09-06 ENCOUNTER — Telehealth (INDEPENDENT_AMBULATORY_CARE_PROVIDER_SITE_OTHER): Payer: Self-pay | Admitting: *Deleted

## 2018-09-06 NOTE — Telephone Encounter (Signed)
Pt is scheduled for MRI on Thurs March 26th at 11:30am with 11:00am arrival at [facility] MRI dept. I tried calling pt no answer, left vm to return call for appt information

## 2018-09-07 NOTE — Telephone Encounter (Signed)
Pt is aware of appt

## 2018-09-09 ENCOUNTER — Encounter: Payer: Self-pay | Admitting: Orthopaedic Surgery

## 2018-09-09 DIAGNOSIS — M19011 Primary osteoarthritis, right shoulder: Secondary | ICD-10-CM | POA: Diagnosis not present

## 2018-09-09 DIAGNOSIS — M75101 Unspecified rotator cuff tear or rupture of right shoulder, not specified as traumatic: Secondary | ICD-10-CM | POA: Diagnosis not present

## 2018-09-09 DIAGNOSIS — M25511 Pain in right shoulder: Secondary | ICD-10-CM | POA: Diagnosis not present

## 2018-09-13 ENCOUNTER — Other Ambulatory Visit: Payer: Self-pay | Admitting: Physician Assistant

## 2018-09-13 ENCOUNTER — Other Ambulatory Visit: Payer: Self-pay | Admitting: Family Medicine

## 2018-09-13 DIAGNOSIS — F411 Generalized anxiety disorder: Secondary | ICD-10-CM

## 2018-09-13 DIAGNOSIS — M25511 Pain in right shoulder: Principal | ICD-10-CM

## 2018-09-13 DIAGNOSIS — G8929 Other chronic pain: Secondary | ICD-10-CM

## 2018-09-13 NOTE — Telephone Encounter (Signed)
Last seen 08/05/2018

## 2018-09-17 ENCOUNTER — Telehealth: Payer: Self-pay | Admitting: Physical Therapy

## 2018-09-17 NOTE — Telephone Encounter (Signed)
Patient was contacted today regarding the temporary reduction of OP Rehab Services  due to concerns for community transmission of Covid-19.  Left voicemail for patient to give our office a call back.

## 2018-09-20 ENCOUNTER — Telehealth (INDEPENDENT_AMBULATORY_CARE_PROVIDER_SITE_OTHER): Payer: Self-pay | Admitting: *Deleted

## 2018-09-20 NOTE — Telephone Encounter (Signed)
Pt called wanting me to give her results of her MRI and I advised her that I do not give results and it would have to be the doctor who ordered it to give to. Pt had MRI done at [facility] and would like results giving to her.   Pt would like a call back today.   CB [phone]

## 2018-09-20 NOTE — Telephone Encounter (Signed)
Spoke with patient, she is aware that you are out of the office today. She is working Tuesday and would like a call on Wednesday with her results. I have placed the report on your desk.

## 2018-09-21 NOTE — Telephone Encounter (Signed)
called

## 2018-10-06 ENCOUNTER — Other Ambulatory Visit (INDEPENDENT_AMBULATORY_CARE_PROVIDER_SITE_OTHER): Payer: Self-pay | Admitting: Orthopaedic Surgery

## 2018-10-06 ENCOUNTER — Telehealth (INDEPENDENT_AMBULATORY_CARE_PROVIDER_SITE_OTHER): Payer: Self-pay | Admitting: Orthopaedic Surgery

## 2018-10-06 MED ORDER — HYDROCODONE-ACETAMINOPHEN 5-325 MG PO TABS: 1.0000 | ORAL_TABLET | Freq: Four times a day (QID) | ORAL | 0 refills | Status: DC | PRN

## 2018-10-06 NOTE — Telephone Encounter (Signed)
Please advise 

## 2018-10-06 NOTE — Telephone Encounter (Signed)
Patient calling, requesting a call back from doctor about scheduling surgery. Patient still trying to work some, and made need something for pain. Rt shoulder bothers her more in the evening. Patient uses [facility] in [place] for her rxs.

## 2018-10-06 NOTE — Telephone Encounter (Signed)
Called and spoke with patient. Relayed the information below.

## 2018-10-06 NOTE — Telephone Encounter (Signed)
Please call Tracy Jensen and let her know that we will schedule surgery once the OR's reopen. Will send in hydrocodone

## 2018-10-27 ENCOUNTER — Other Ambulatory Visit: Payer: Self-pay | Admitting: Physician Assistant

## 2018-10-27 DIAGNOSIS — F411 Generalized anxiety disorder: Secondary | ICD-10-CM

## 2018-11-02 ENCOUNTER — Telehealth: Payer: Self-pay | Admitting: Orthopaedic Surgery

## 2018-11-02 ENCOUNTER — Other Ambulatory Visit: Payer: Self-pay | Admitting: *Deleted

## 2018-11-02 MED ORDER — DICLOFENAC SODIUM 75 MG PO TBEC: 75.0000 mg | DELAYED_RELEASE_TABLET | Freq: Two times a day (BID) | ORAL | 0 refills | Status: DC

## 2018-11-02 NOTE — Telephone Encounter (Signed)
Patient called requesting a return call to schedule her shoulder surgery.

## 2018-11-02 NOTE — Telephone Encounter (Signed)
Please call patient. Thank you.  

## 2018-11-11 ENCOUNTER — Other Ambulatory Visit: Payer: Self-pay | Admitting: Orthopedic Surgery

## 2018-11-11 DIAGNOSIS — M7541 Impingement syndrome of right shoulder: Secondary | ICD-10-CM

## 2018-11-11 DIAGNOSIS — M75121 Complete rotator cuff tear or rupture of right shoulder, not specified as traumatic: Secondary | ICD-10-CM

## 2018-11-11 DIAGNOSIS — M19011 Primary osteoarthritis, right shoulder: Secondary | ICD-10-CM

## 2018-11-11 DIAGNOSIS — S46221A Laceration of muscle, fascia and tendon of other parts of biceps, right arm, initial encounter: Secondary | ICD-10-CM

## 2018-11-11 DIAGNOSIS — M7521 Bicipital tendinitis, right shoulder: Secondary | ICD-10-CM

## 2018-11-11 MED ORDER — HYDROCODONE-ACETAMINOPHEN 5-325 MG PO TABS: 1.0000 | ORAL_TABLET | ORAL | 0 refills | Status: DC | PRN

## 2018-11-15 ENCOUNTER — Other Ambulatory Visit: Payer: Self-pay

## 2018-11-15 ENCOUNTER — Encounter: Payer: Self-pay | Admitting: Physician Assistant

## 2018-11-15 ENCOUNTER — Ambulatory Visit (INDEPENDENT_AMBULATORY_CARE_PROVIDER_SITE_OTHER): Payer: 59 | Admitting: Physician Assistant

## 2018-11-15 DIAGNOSIS — M25511 Pain in right shoulder: Secondary | ICD-10-CM

## 2018-11-15 DIAGNOSIS — F411 Generalized anxiety disorder: Secondary | ICD-10-CM

## 2018-11-15 DIAGNOSIS — G43809 Other migraine, not intractable, without status migrainosus: Secondary | ICD-10-CM

## 2018-11-15 DIAGNOSIS — G8929 Other chronic pain: Secondary | ICD-10-CM | POA: Diagnosis not present

## 2018-11-15 MED ORDER — BUSPIRONE HCL 15 MG PO TABS: 15.0000 mg | ORAL_TABLET | Freq: Three times a day (TID) | ORAL | 11 refills | Status: DC

## 2018-11-15 MED ORDER — DICLOFENAC SODIUM 75 MG PO TBEC: 75.0000 mg | DELAYED_RELEASE_TABLET | Freq: Two times a day (BID) | ORAL | 1 refills | Status: DC

## 2018-11-15 MED ORDER — METHOCARBAMOL 500 MG PO TABS: 500.0000 mg | ORAL_TABLET | Freq: Three times a day (TID) | ORAL | 5 refills | Status: DC | PRN

## 2018-11-16 NOTE — Progress Notes (Signed)
Telephone visit  Subjective: CC: Check on chronic medications and conditions PCP: Remus Loffler, PA-C Tracy Jensen is a 63 y.o. female calls for telephone consult today. Patient provides verbal consent for consult held via phone.  Patient is identified with 2 separate identifiers.  At this time the entire area is on COVID-19 social distancing and stay home orders are in place.  Patient is of higher risk and therefore we are performing this by a virtual method.  Location of patient: Home Location of provider: HOME Others present for call: No  This is a recheck on patient's chronic medical conditions and get refills on her medications.  She did have a shoulder surgery about 1 week ago.  She states that she is recuperating pretty well.  She has not had an extreme amount of pain.  She will come back in about a week to have it rechecked.  She anticipates having do a lot of physical therapy.  Her medical conditions are do include generalized anxiety and migraine.  Refills will be sent for her medications.  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  She is helping take care of her grandchild.   ROS: Per HPI  Allergies  Allergen Reactions  . Azithromycin Hives    Hives after Zpack   Past Medical History:  Diagnosis Date  . Hypertension   . Migraines   . Sciatica   . TIA (transient ischemic attack)     Current Outpatient Medications:  .  aspirin EC 81 MG tablet, Take 81 mg by mouth daily., Disp: , Rfl:  .  busPIRone (BUSPAR) 15 MG tablet, Take 1 tablet (15 mg total) by mouth 3 (three) times daily. Needs to be seen, Disp: 90 tablet, Rfl: 11 .  butalbital-acetaminophen-caffeine (FIORICET, ESGIC) 50-325-40 MG tablet, TAKE ONE TABLET BY MOUTH EVERY 8 HOURS. (Patient taking differently: Take 1 tablet by mouth every 8 (eight) hours. ), Disp: 10 tablet, Rfl: 5 .  cyclobenzaprine (FLEXERIL) 10  MG tablet, TAKE 1 TABLET BY MOUTH THREE TIMES DAILY (Patient taking differently: Take 10 mg by mouth 3 (three) times daily. ), Disp: 90 tablet, Rfl: 5 .  diclofenac (VOLTAREN) 75 MG EC tablet, Take 1 tablet (75 mg total) by mouth 2 (two) times daily., Disp: 180 tablet, Rfl: 1 .  fluticasone (FLONASE) 50 MCG/ACT nasal spray, Place 2 sprays into both nostrils daily., Disp: 16 g, Rfl: 6 .  HYDROcodone-acetaminophen (NORCO/VICODIN) 5-325 MG tablet, Take 1 tablet by mouth every 4 (four) hours as needed for moderate pain., Disp: 40 tablet, Rfl: 0 .  lisinopril (PRINIVIL,ZESTRIL) 20 MG tablet, TAKE 1 TABLET BY MOUTH ONCE DAILY (Patient taking differently: Take 20 mg by mouth daily. ), Disp: 90 tablet, Rfl: 3 .  methocarbamol (ROBAXIN) 500 MG tablet, Take 1 tablet (500 mg total) by mouth every 8 (eight) hours as needed for muscle spasms., Disp: 90 tablet, Rfl: 5 .  polyethylene glycol (MIRALAX / GLYCOLAX) packet, Take 17 g by mouth daily as needed for mild constipation or moderate constipation., Disp: 14 each, Rfl: 0 .  traZODone (DESYREL) 50 MG tablet, Take 2 tablets (100 mg total) by mouth at bedtime., Disp: 60 tablet, Rfl: 5  Assessment/ Plan: 63 y.o. female   1. GAD (generalized anxiety disorder) - busPIRone (BUSPAR) 15 MG tablet; Take 1 tablet (15 mg total) by mouth 3 (three) times daily. Needs  to be seen  Dispense: 90 tablet; Refill: 11  2. Other migraine without status migrainosus, not intractable  3. Chronic right shoulder pain - diclofenac (VOLTAREN) 75 MG EC tablet; Take 1 tablet (75 mg total) by mouth 2 (two) times daily.  Dispense: 180 tablet; Refill: 1 - methocarbamol (ROBAXIN) 500 MG tablet; Take 1 tablet (500 mg total) by mouth every 8 (eight) hours as needed for muscle spasms.  Dispense: 90 tablet; Refill: 5   Start time: 4:10 PM End time: 4:23 PM  Meds ordered this encounter  Medications  . busPIRone (BUSPAR) 15 MG tablet    Sig: Take 1 tablet (15 mg total) by mouth 3 (three)  times daily. Needs to be seen    Dispense:  90 tablet    Refill:  11    Order Specific Question:   Supervising Provider    Answer:   Raliegh IpGOTTSCHALK, ASHLY M [[id number]]  . diclofenac (VOLTAREN) 75 MG EC tablet    Sig: Take 1 tablet (75 mg total) by mouth 2 (two) times daily.    Dispense:  180 tablet    Refill:  1    Order Specific Question:   Supervising Provider    Answer:   Raliegh IpGOTTSCHALK, ASHLY M [[id number]]  . methocarbamol (ROBAXIN) 500 MG tablet    Sig: Take 1 tablet (500 mg total) by mouth every 8 (eight) hours as needed for muscle spasms.    Dispense:  90 tablet    Refill:  5    Order Specific Question:   Supervising Provider    Answer:   Raliegh IpGOTTSCHALK, ASHLY M [[id number]]    Prudy FeelerAngel Cariann Kinnamon PA-C [facility] [phone]

## 2018-11-17 ENCOUNTER — Ambulatory Visit (INDEPENDENT_AMBULATORY_CARE_PROVIDER_SITE_OTHER): Payer: 59 | Admitting: Orthopaedic Surgery

## 2018-11-17 ENCOUNTER — Encounter: Payer: Self-pay | Admitting: Orthopaedic Surgery

## 2018-11-17 ENCOUNTER — Other Ambulatory Visit: Payer: Self-pay

## 2018-11-17 VITALS — Ht <= 58 in | Wt 110.0 lb

## 2018-11-17 DIAGNOSIS — M25511 Pain in right shoulder: Secondary | ICD-10-CM

## 2018-11-17 DIAGNOSIS — G8929 Other chronic pain: Secondary | ICD-10-CM

## 2018-11-17 NOTE — Progress Notes (Signed)
   Office Visit Note   Patient: Tracy Jensen           Date of Birth: May 29, 1956           MRN: [id number] Visit Date: 11/17/2018              Requested by: Remus Loffler, PA-C [address] [place], [place] [address] PCP: Remus Loffler, PA-C   Assessment & Plan: Visit Diagnoses:  1. Chronic right shoulder pain     Plan: 6 days status post rotator cuff tear repair of right shoulder through a mini approach with biceps tenodesis.  Also had an SCD and DCR.  Comfortable at present in the sling.  Applied new Steri-Strips.  May shower.  Continue with sling.  Office 1 week.therapy next visit  Follow-Up Instructions: Return in about 1 week (around 11/24/2018).   Orders:  No orders of the defined types were placed in this encounter.  No orders of the defined types were placed in this encounter.     Procedures: No procedures performed   Clinical Data: No additional findings.   Subjective: Chief Complaint  Patient presents with  . Right Shoulder - Routine Post Op    Right shoulder scope 11/11/18  Patient presents today for a six day postop visit. She had a right shoulder scope with SAD and DCR on 11/11/18. She is taking hydrocodone at night to sleep.  Overall she feels that she is doing well.   HPI  Review of Systems   Objective: Vital Signs: Ht 4\' 9"  (1.448 m)   Wt 110 lb (49.9 kg)   BMI 23.80 kg/m   Physical Exam  Ortho Exam right shoulder incisions are healing without problem.  No drainage.  Neurologically intact.  Very minimal ecchymosis.  No pain with range of motion of cervical spine  Specialty Comments:  No specialty comments available.  Imaging: No results found.   PMFS History: Patient Active Problem List   Diagnosis Date Noted  . GAD (generalized anxiety disorder) 11/15/2018  . Chronic right shoulder pain 08/05/2018  . Abdominal pain 07/10/2018  . Chronic right-sided low back pain with right-sided sciatica 10/10/2017  . Acute recurrent frontal sinusitis  07/03/2017  . Olecranon bursitis of right elbow 07/14/2016  . Migraine 07/14/2016  . Sexual dysfunction 07/14/2016  . History of CVA (cerebrovascular accident) 04/08/2016  . HTN (hypertension), benign 04/08/2016  . Depression 04/08/2016   Past Medical History:  Diagnosis Date  . Hypertension   . Migraines   . Sciatica   . TIA (transient ischemic attack)     History reviewed. No pertinent family history.  History reviewed. No pertinent surgical history. Social History   Occupational History  . Not on file  Tobacco Use  . Smoking status: Never Smoker  . Smokeless tobacco: Never Used  Substance and Sexual Activity  . Alcohol use: Yes    Comment: occ  . Drug use: No  . Sexual activity: Not on file

## 2018-11-24 ENCOUNTER — Other Ambulatory Visit: Payer: Self-pay

## 2018-11-24 ENCOUNTER — Ambulatory Visit (INDEPENDENT_AMBULATORY_CARE_PROVIDER_SITE_OTHER): Payer: 59 | Admitting: Orthopaedic Surgery

## 2018-11-24 ENCOUNTER — Encounter: Payer: Self-pay | Admitting: Orthopaedic Surgery

## 2018-11-24 VITALS — BP 105/64 | HR 77 | Ht <= 58 in | Wt 110.0 lb

## 2018-11-24 DIAGNOSIS — M25511 Pain in right shoulder: Secondary | ICD-10-CM

## 2018-11-24 DIAGNOSIS — G8929 Other chronic pain: Secondary | ICD-10-CM

## 2018-11-24 NOTE — Progress Notes (Signed)
   Office Visit Note   Patient: Tracy Jensen           Date of Birth: 1956-05-05           MRN: [id number] Visit Date: 11/24/2018              Requested by: Terald Sleeper, PA-C [address] [place], [place] [address] PCP: Terald Sleeper, PA-C   Assessment & Plan: Visit Diagnoses:  1. Chronic right shoulder pain     Plan: 2 weeks status post rotator cuff tear repair right shoulder involving both the infra and supraspinatus tendons.  Also performed an SCD, DCR biceps tenodesis.  Actually doing very well.  Wearing the sling.  We will start physical therapy for passive range of motion for 1 month Follow-Up Instructions: Return in about 3 weeks (around 12/15/2018).   Orders:  Orders Placed This Encounter  Procedures  . Ambulatory referral to Physical Therapy   No orders of the defined types were placed in this encounter.     Procedures: No procedures performed   Clinical Data: No additional findings.   Subjective: Chief Complaint  Patient presents with  . Right Shoulder - Routine Post Op    Right shoulder scope DOS 11/11/18  Patient presents today almost two weeks out from surgery. She had a right shoulder scope with biceps tenodesis, SCD, and DCR on 11/11/18. Patient states that her shoulder is doing okay. She had some stiffness this morning upon getting up. She has been doing home exercises. She takes 1/4 of a hydrocodone tablet if needed.  HPI  Review of Systems   Objective: Vital Signs: BP 105/64   Pulse 77   Ht 4\' 9"  (1.448 m)   Wt 110 lb (49.9 kg)   BMI 23.80 kg/m   Physical Exam  Ortho Exam right shoulder incisions healing without problem.  Steri-Strips removed.  I can abduct and flex passively at least 90 degrees without problem.  Good grip and good release.  Biceps appears in normal position.  Specialty Comments:  No specialty comments available.  Imaging: No results found.   PMFS History: Patient Active Problem List   Diagnosis Date Noted  . GAD  (generalized anxiety disorder) 11/15/2018  . Chronic right shoulder pain 08/05/2018  . Abdominal pain 07/10/2018  . Chronic right-sided low back pain with right-sided sciatica 10/10/2017  . Acute recurrent frontal sinusitis 07/03/2017  . Olecranon bursitis of right elbow 07/14/2016  . Migraine 07/14/2016  . Sexual dysfunction 07/14/2016  . History of CVA (cerebrovascular accident) 04/08/2016  . HTN (hypertension), benign 04/08/2016  . Depression 04/08/2016   Past Medical History:  Diagnosis Date  . Hypertension   . Migraines   . Sciatica   . TIA (transient ischemic attack)     History reviewed. No pertinent family history.  History reviewed. No pertinent surgical history. Social History   Occupational History  . Not on file  Tobacco Use  . Smoking status: Never Smoker  . Smokeless tobacco: Never Used  Substance and Sexual Activity  . Alcohol use: Yes    Comment: occ  . Drug use: No  . Sexual activity: Not on file

## 2018-11-29 ENCOUNTER — Other Ambulatory Visit: Payer: Self-pay

## 2018-11-29 ENCOUNTER — Ambulatory Visit: Payer: 59 | Attending: Orthopaedic Surgery | Admitting: Physical Therapy

## 2018-11-29 ENCOUNTER — Encounter: Payer: Self-pay | Admitting: Physical Therapy

## 2018-11-29 DIAGNOSIS — M6281 Muscle weakness (generalized): Secondary | ICD-10-CM | POA: Insufficient documentation

## 2018-11-29 DIAGNOSIS — M25511 Pain in right shoulder: Secondary | ICD-10-CM | POA: Diagnosis not present

## 2018-11-29 DIAGNOSIS — M25611 Stiffness of right shoulder, not elsewhere classified: Secondary | ICD-10-CM | POA: Diagnosis present

## 2018-11-29 NOTE — Therapy (Signed)
[facility] [address] [place], [place], [address] Phone: [phone]   Fax:  [phone]  Physical Therapy Evaluation  Patient Details  Name: Tracy Jensen MRN: [id number] Date of Birth: 29-Nov-1955 Referring Provider (PT): Norlene CampbellPeter Whitfield, MD   Encounter Date: 11/29/2018  PT End of Session - 11/29/18 1850    Visit Number  1    Number of Visits  18    Date for PT Re-Evaluation  01/17/19    Authorization Type  FOTO; Progress note every 10th visit    PT Start Time  1015    PT Stop Time  1100    PT Time Calculation (min)  45 min    Equipment Utilized During Treatment  Other (comment)   sling   Activity Tolerance  Patient tolerated treatment well    Behavior During Therapy  [facility] for tasks assessed/performed       Past Medical History:  Diagnosis Date  . Hypertension   . Migraines   . Sciatica   . TIA (transient ischemic attack)     History reviewed. No pertinent surgical history.  There were no vitals filed for this visit.   Subjective Assessment - 11/29/18 1839    Subjective  COVID-19 screening performed prior to patient entering the building.Patient presents to physical therapy with reports of right shoulder pain and difficulty with ADLs secondary to right shoulder RTC repair with SAD, DCR, and biceps tenodesis on 11/11/2018. Patient is compliant with wearing the sling at home. Patient reports using her right arm for certain activities like dressing and showering but get assistance from her husband as needed. Patient reports difficulties with meal preparation and caregiving activities for grandchild. Patient reports difficulties with sleeping due to feeling uncomfortable. Patient reports pain at worst as 6-7/10 and pain at best is 2/10 with pain medications. Patient's goals are to decrease pain, improve movement, improve strength, improve ability to perform home, work, and caregiving activities for her grandchild.    Pertinent  History  right RTC repair with SAD, DCR, and biceps tenodesis 11/11/2018, HTN, history of stroke    Limitations  Lifting;House hold activities;Other (comment)    Diagnostic tests  MRI: tear in right shoulder per patient    Patient Stated Goals  use arm without pain, take care of grandchild    Currently in Pain?  Yes    Pain Score  4     Pain Location  Shoulder    Pain Orientation  Right    Pain Descriptors / Indicators  Aching;Sharp;Discomfort    Pain Type  Surgical pain    Pain Onset  1 to 4 weeks ago    Pain Frequency  Constant    Aggravating Factors   "certain way I move"    Pain Relieving Factors  "ice and heat"    Effect of Pain on Daily Activities  "can't lift, cut, do dishes, housework"         [facility] 11/29/18 0001      Assessment   Medical Diagnosis  Chronic R shoulder pain S/P scope    Referring Provider (PT)  Norlene CampbellPeter Whitfield, MD    Onset Date/Surgical Date  11/11/18    Hand Dominance  Right    Next MD Visit  12/15/2018    Prior Therapy  yes      Precautions   Precautions  Shoulder    Type of Shoulder Precautions  Right RTC repair    Precaution Comments  PROM for 4 weeks  per MD      Restrictions   Weight Bearing Restrictions  Yes      Balance Screen   Has the patient fallen in the past 6 months  No    Has the patient had a decrease in activity level because of a fear of falling?   No    Is the patient reluctant to leave their home because of a fear of falling?   No      Home Environment   Living Environment  Private residence    Living Arrangements  Spouse/significant other;Children      Prior Function   Level of Independence  Needs assistance with ADLs;Needs assistance with homemaking      Observation/Other Assessments   Observations  sling donned    Focus on Therapeutic Outcomes (FOTO)   75% limited      ROM / Strength   AROM / PROM / Strength  PROM      PROM   Overall PROM   Due to pain    PROM Assessment Site  Shoulder    Right/Left  Shoulder  Right    Right Shoulder Flexion  110 Degrees    Right Shoulder ABduction  50 Degrees    Right Shoulder Internal Rotation  --   to abdomen at 40 degrees ABD   Right Shoulder External Rotation  24 Degrees      Palpation   Patella mobility  tenderness to palpation upon right shoulder      Transfers   Transfers  Independent with all Transfers                Objective measurements completed on examination: See above findings.      [facility] Adult PT Treatment/Exercise - 11/29/18 0001      Modalities   Modalities  Vasopneumatic      Vasopneumatic   Number Minutes Vasopneumatic   10 minutes    Vasopnuematic Location   Shoulder    Vasopneumatic Pressure  Low    Vasopneumatic Temperature   34             PT Education - 11/29/18 1848    Education Details  Pendulums, table PROM flexion    Person(s) Educated  Patient    Methods  Explanation;Demonstration;Handout    Comprehension  Verbalized understanding;Returned demonstration          PT Long Term Goals - 11/29/18 1855      PT LONG TERM GOAL #1   Title  Patient will be independent with HEP and its progression.    Time  6    Period  Weeks    Status  New      PT LONG TERM GOAL #2   Title  Patient will demonstrate  145+ degrees of right shoulder flexion AROM to improve ability to perform functional tasks.    Time  6    Period  Weeks    Status  New      PT LONG TERM GOAL #3   Title  Patient will demonstrate 4/5 or greater right UE strength in all planes to improve ability to perform functional activities.    Time  6    Period  Weeks    Status  New      PT LONG TERM GOAL #4   Title  Patient will report ability to perform ADLs and caregiving activities for her grandson with right shoulder pain less than or equal to 3/10.    Time  6  Period  Weeks    Status  New      PT LONG TERM GOAL #5   Title  Patient will demonstrate 55+ degrees or greater of right shoulder ER AROM to improve ability to  don/doff apparel.    Time  6    Period  Weeks    Status  New             Plan - 11/29/18 2008    Clinical Impression Statement  Patient is a right-handed 63 year old female who presents to physical therapy with right shoulder pain and decreased right shoulder PROM due to right shoulder RTC repair with SAD, DCR, and biceps tenodesis on 11/11/2018. Patient is independent with donning/doffing off sling. Patient was educated on importance of sling and not using the arm to preserve surgery. Patient provided with HEP of PROM exercises to prevent stiffness. Patient reported understanding. Patient would benefit from skilled physical therapy to address deficits and address patient's goals.    Personal Factors and Comorbidities  Comorbidity 1;Time since onset of injury/illness/exacerbation;Profession    Examination-Activity Limitations  Carry;Reach Overhead;Lift;Caring for Others;Dressing;Hygiene/Grooming    Examination-Participation Restrictions  Driving    Stability/Clinical Decision Making  Stable/Uncomplicated    Clinical Decision Making  Low    Rehab Potential  Good    PT Frequency  3x / week    PT Duration  6 weeks    PT Treatment/Interventions  Cryotherapy;ADLs/Self Care Home Management;Electrical Stimulation;Iontophoresis 4mg /ml Dexamethasone;Moist Heat;Ultrasound;Therapeutic activities;Therapeutic exercise;Manual techniques;Dry needling;Passive range of motion;Vasopneumatic Device;Taping;Patient/family education    PT Next Visit Plan  PROM to right shoulder per MD protocol Modalities PRN for pain relief    PT Home Exercise Plan  see patient education section    Consulted and Agree with Plan of Care  Patient       Patient will benefit from skilled therapeutic intervention in order to improve the following deficits and impairments:  Pain, Impaired UE functional use, Decreased strength, Decreased range of motion, Postural dysfunction, Decreased knowledge of precautions  Visit  Diagnosis: 1. Acute pain of right shoulder   2. Stiffness of right shoulder, not elsewhere classified   3. Muscle weakness (generalized)        Problem List Patient Active Problem List   Diagnosis Date Noted  . GAD (generalized anxiety disorder) 11/15/2018  . Chronic right shoulder pain 08/05/2018  . Abdominal pain 07/10/2018  . Chronic right-sided low back pain with right-sided sciatica 10/10/2017  . Acute recurrent frontal sinusitis 07/03/2017  . Olecranon bursitis of right elbow 07/14/2016  . Migraine 07/14/2016  . Sexual dysfunction 07/14/2016  . History of CVA (cerebrovascular accident) 04/08/2016  . HTN (hypertension), benign 04/08/2016  . Depression 04/08/2016   [name], PT, DPT 11/29/2018, 8:12 PM  [facility] [address] [place], [place], [address] Phone: [phone]   Fax:  [phone]  Name: Tracy Jensen MRN: [id number] Date of Birth: 05/12/1956

## 2018-12-01 ENCOUNTER — Ambulatory Visit: Payer: 59 | Admitting: Physical Therapy

## 2018-12-01 ENCOUNTER — Other Ambulatory Visit: Payer: Self-pay

## 2018-12-01 ENCOUNTER — Encounter: Payer: Self-pay | Admitting: Physical Therapy

## 2018-12-01 DIAGNOSIS — M25611 Stiffness of right shoulder, not elsewhere classified: Secondary | ICD-10-CM

## 2018-12-01 DIAGNOSIS — M6281 Muscle weakness (generalized): Secondary | ICD-10-CM

## 2018-12-01 DIAGNOSIS — M25511 Pain in right shoulder: Secondary | ICD-10-CM

## 2018-12-01 NOTE — Therapy (Signed)
[facility] [address] [place], [place], [address] Phone: [phone]   Fax:  [phone]  Physical Therapy Treatment  Patient Details  Name: Tracy Jensen MRN: [id number] Date of Birth: [birth year] Referring Provider (PT): Norlene CampbellPeter Whitfield, MD   Encounter Date: 12/01/2018  PT End of Session - 12/01/18 0900    Visit Number  2    Number of Visits  18    Date for PT Re-Evaluation  01/17/19    Authorization Type  FOTO; Progress note every 10th visit    PT Start Time  0815    Equipment Utilized During Treatment  Other (comment)   right sling   Activity Tolerance  Patient tolerated treatment well    Behavior During Therapy  [facility] for tasks assessed/performed       Past Medical History:  Diagnosis Date  . Hypertension   . Migraines   . Sciatica   . TIA (transient ischemic attack)     History reviewed. No pertinent surgical history.  There were no vitals filed for this visit.  Subjective Assessment - 12/01/18 0815    Subjective  COVID-19 screening performed prior to patient entering the building. Patient reports having difficulties with getting comfortable sleeping and reports feeling pain when she moves it a certain way.    Pertinent History  right RTC repair with SAD, DCR, and biceps tenodesis 11/11/2018, HTN, history of stroke    Limitations  Lifting;House hold activities;Other (comment)    Diagnostic tests  MRI: tear in right shoulder per patient    Patient Stated Goals  use arm without pain, take care of grandchild    Currently in Pain?  Yes    Pain Score  3     Pain Orientation  Right    Pain Descriptors / Indicators  Aching;Discomfort    Pain Type  Surgical pain    Pain Onset  1 to 4 weeks ago    Pain Frequency  Constant         [facility] PT Assessment - 12/01/18 0001      Assessment   Medical Diagnosis  Chronic R shoulder pain S/P scope    Referring Provider (PT)  Norlene CampbellPeter Whitfield, MD    Onset Date/Surgical Date   11/11/18    Hand Dominance  Right    Next MD Visit  12/15/2018    Prior Therapy  yes      Precautions   Precautions  Shoulder    Type of Shoulder Precautions  Right RTC repair    Precaution Comments  PROM for 4 weeks per MD                   [facility] PT Treatment/Exercise - 12/01/18 0001      Exercises   Exercises  Shoulder      Modalities   Modalities  Vasopneumatic      Electrical Stimulation   Electrical Stimulation Location  Right shoulder    Electrical Stimulation Action  IFC    Electrical Stimulation Parameters  80-150 hz x15 mins    Electrical Stimulation Goals  Pain      Vasopneumatic   Number Minutes Vasopneumatic   15 minutes    Vasopnuematic Location   Shoulder    Vasopneumatic Pressure  Low    Vasopneumatic Temperature   34      Manual Therapy   Manual Therapy  Passive ROM    Passive ROM  PROM to right shoulder into flexion and ER to improve  range; oscillations to promote muscle relaxation             PT Education - 12/01/18 0928    Education Details  education on protocol and surgery importance of taking proper precautions to allow for adequate healing and prevent damage to shoulder    Person(s) Educated  Patient    Methods  Explanation    Comprehension  Verbalized understanding          PT Long Term Goals - 11/29/18 1855      PT LONG TERM GOAL #1   Title  Patient will be independent with HEP and its progression.    Time  6    Period  Weeks    Status  New      PT LONG TERM GOAL #2   Title  Patient will demonstrate  145+ degrees of right shoulder flexion AROM to improve ability to perform functional tasks.    Time  6    Period  Weeks    Status  New      PT LONG TERM GOAL #3   Title  Patient will demonstrate 4/5 or greater right UE strength in all planes to improve ability to perform functional activities.    Time  6    Period  Weeks    Status  New      PT LONG TERM GOAL #4   Title  Patient will report ability to  perform ADLs and caregiving activities for her grandson with right shoulder pain less than or equal to 3/10.    Time  6    Period  Weeks    Status  New      PT LONG TERM GOAL #5   Title  Patient will demonstrate 55+ degrees or greater of right shoulder ER AROM to improve ability to don/doff apparel.    Time  6    Period  Weeks    Status  New            Plan - 12/01/18 0900    Clinical Impression Statement  Patient was able to tolerate PROM fairly well but did report increased discomfort with PROM into ER. Patient was educated again on protcol and importance of protecting the right shoulder. Patient reported understanding. Patient provided with putty to strengthen grip. Normal response to modalities upon removal. Patient was observed at end of session driving herself home.    Personal Factors and Comorbidities  Comorbidity 1;Time since onset of injury/illness/exacerbation;Profession    Examination-Activity Limitations  Carry;Reach Overhead;Lift;Caring for Others;Dressing;Hygiene/Grooming    Examination-Participation Restrictions  Driving    Stability/Clinical Decision Making  Stable/Uncomplicated    Clinical Decision Making  Low    Rehab Potential  Good    PT Frequency  3x / week    PT Duration  6 weeks    PT Treatment/Interventions  Cryotherapy;ADLs/Self Care Home Management;Electrical Stimulation;Iontophoresis 4mg /ml Dexamethasone;Moist Heat;Ultrasound;Therapeutic activities;Therapeutic exercise;Manual techniques;Dry needling;Passive range of motion;Vasopneumatic Device;Taping;Patient/family education    PT Next Visit Plan  PROM to right shoulder per MD protocol Modalities PRN for pain relief    Consulted and Agree with Plan of Care  Patient       Patient will benefit from skilled therapeutic intervention in order to improve the following deficits and impairments:  Pain, Impaired UE functional use, Decreased strength, Decreased range of motion, Postural dysfunction, Decreased  knowledge of precautions  Visit Diagnosis: 1. Acute pain of right shoulder   2. Stiffness of right shoulder, not elsewhere classified   3.  Muscle weakness (generalized)        Problem List Patient Active Problem List   Diagnosis Date Noted  . GAD (generalized anxiety disorder) 11/15/2018  . Chronic right shoulder pain 08/05/2018  . Abdominal pain 07/10/2018  . Chronic right-sided low back pain with right-sided sciatica 10/10/2017  . Acute recurrent frontal sinusitis 07/03/2017  . Olecranon bursitis of right elbow 07/14/2016  . Migraine 07/14/2016  . Sexual dysfunction 07/14/2016  . History of CVA (cerebrovascular accident) 04/08/2016  . HTN (hypertension), benign 04/08/2016  . Depression 04/08/2016   Guss BundeKrystle Asmaa Tirpak, PT, DPT 12/01/2018, 9:34 AM  [facility] [address] [place], [place], [address] Phone: [phone]   Fax:  [phone]  Name: Tracy Jensen MRN: [id number] Date of Birth: August 12, 1955

## 2018-12-03 ENCOUNTER — Other Ambulatory Visit: Payer: Self-pay

## 2018-12-03 ENCOUNTER — Ambulatory Visit: Payer: 59 | Admitting: Physical Therapy

## 2018-12-03 DIAGNOSIS — M6281 Muscle weakness (generalized): Secondary | ICD-10-CM

## 2018-12-03 DIAGNOSIS — M25511 Pain in right shoulder: Secondary | ICD-10-CM

## 2018-12-03 DIAGNOSIS — M25611 Stiffness of right shoulder, not elsewhere classified: Secondary | ICD-10-CM

## 2018-12-03 NOTE — Therapy (Signed)
[facility] [address] [place], [place], [address] Phone: [phone]   Fax:  [phone]  Physical Therapy Treatment  Patient Details  Name: Tracy Jensen MRN: [id number] Date of Birth: Nov 12, 1955 Referring Provider (PT): Norlene CampbellPeter Whitfield, MD   Encounter Date: 12/03/2018  PT End of Session - 12/03/18 0917    Visit Number  3    Number of Visits  18    Date for PT Re-Evaluation  01/17/19    Authorization Type  FOTO; Progress note every 10th visit    PT Start Time  0815    PT Stop Time  0902    PT Time Calculation (min)  47 min    Equipment Utilized During Treatment  Other (comment)   right sling   Activity Tolerance  Patient tolerated treatment well    Behavior During Therapy  [facility] for tasks assessed/performed       Past Medical History:  Diagnosis Date  . Hypertension   . Migraines   . Sciatica   . TIA (transient ischemic attack)     No past surgical history on file.  There were no vitals filed for this visit.  Subjective Assessment - 12/03/18 0853    Subjective  COVID-19 screening performed prior to patient entering the building. Patient reports sleeping  better the last two nights and has been compliant with HEP.    Pertinent History  right RTC repair with SAD, DCR, and biceps tenodesis 11/11/2018, HTN, history of stroke    Limitations  Lifting;House hold activities;Other (comment)    Diagnostic tests  MRI: tear in right shoulder per patient    Patient Stated Goals  use arm without pain, take care of grandchild    Currently in Pain?  Yes    Pain Score  2     Pain Location  Shoulder    Pain Orientation  Right    Pain Descriptors / Indicators  Aching;Sore    Pain Type  Surgical pain    Pain Onset  1 to 4 weeks ago    Pain Frequency  Constant         [facility] PT Assessment - 12/03/18 0001      Assessment   Medical Diagnosis  Chronic R shoulder pain S/P scope    Referring Provider (PT)  Norlene CampbellPeter Whitfield, MD    Onset  Date/Surgical Date  11/11/18    Hand Dominance  Right    Next MD Visit  12/15/2018    Prior Therapy  yes      Precautions   Precautions  Shoulder    Type of Shoulder Precautions  Right RTC repair, mini open, SAD, DCR, biceps tenodesis    Precaution Comments  PROM for 4 weeks per MD                   [facility] - 12/03/18 0001      Exercises   Exercises  --      Electrical Stimulation   Electrical Stimulation Location  right shoulder    Electrical Stimulation Action  IFC    Electrical Stimulation Parameters  80-150 hz x15 mins    Electrical Stimulation Goals  Pain      Vasopneumatic   Number Minutes Vasopneumatic   15 minutes    Vasopnuematic Location   Shoulder    Vasopneumatic Pressure  Low    Vasopneumatic Temperature   40      Manual Therapy   Manual Therapy  Passive ROM  Passive ROM  PROM to right shoulder into flexion and ER to improve range; oscillations to promote muscle relaxation                  PT Long Term Goals - 11/29/18 1855      PT LONG TERM GOAL #1   Title  Patient will be independent with HEP and its progression.    Time  6    Period  Weeks    Status  New      PT LONG TERM GOAL #2   Title  Patient will demonstrate  145+ degrees of right shoulder flexion AROM to improve ability to perform functional tasks.    Time  6    Period  Weeks    Status  New      PT LONG TERM GOAL #3   Title  Patient will demonstrate 4/5 or greater right UE strength in all planes to improve ability to perform functional activities.    Time  6    Period  Weeks    Status  New      PT LONG TERM GOAL #4   Title  Patient will report ability to perform ADLs and caregiving activities for her grandson with right shoulder pain less than or equal to 3/10.    Time  6    Period  Weeks    Status  New      PT LONG TERM GOAL #5   Title  Patient will demonstrate 55+ degrees or greater of right shoulder ER AROM to improve ability to  don/doff apparel.    Time  6    Period  Weeks    Status  New            Plan - 12/03/18 0856    Clinical Impression Statement  Patient was able to tolerate treatment despite reporting tightness with right shoulder flexion PROM. Patient required oscillations and verbal cues for muscle relaxation. Patient reported slight soreness after PROM. Normal response to modalities upon removal.    Personal Factors and Comorbidities  Comorbidity 1;Time since onset of injury/illness/exacerbation;Profession    Examination-Activity Limitations  Carry;Reach Overhead;Lift;Caring for Others;Dressing;Hygiene/Grooming    Examination-Participation Restrictions  Driving    Stability/Clinical Decision Making  Stable/Uncomplicated    Clinical Decision Making  Low    Rehab Potential  Good    PT Frequency  3x / week    PT Duration  6 weeks    PT Treatment/Interventions  Cryotherapy;ADLs/Self Care Home Management;Electrical Stimulation;Iontophoresis 4mg /ml Dexamethasone;Moist Heat;Ultrasound;Therapeutic activities;Therapeutic exercise;Manual techniques;Dry needling;Passive range of motion;Vasopneumatic Device;Taping;Patient/family education    PT Next Visit Plan  PROM to right shoulder per MD protocol Modalities PRN for pain relief    Consulted and Agree with Plan of Care  Patient       Patient will benefit from skilled therapeutic intervention in order to improve the following deficits and impairments:  Pain, Impaired UE functional use, Decreased strength, Decreased range of motion, Postural dysfunction, Decreased knowledge of precautions  Visit Diagnosis: 1. Acute pain of right shoulder   2. Stiffness of right shoulder, not elsewhere classified   3. Muscle weakness (generalized)        Problem List Patient Active Problem List   Diagnosis Date Noted  . GAD (generalized anxiety disorder) 11/15/2018  . Chronic right shoulder pain 08/05/2018  . Abdominal pain 07/10/2018  . Chronic right-sided low back  pain with right-sided sciatica 10/10/2017  . Acute recurrent frontal sinusitis 07/03/2017  . Olecranon bursitis of  right elbow 07/14/2016  . Migraine 07/14/2016  . Sexual dysfunction 07/14/2016  . History of CVA (cerebrovascular accident) 04/08/2016  . HTN (hypertension), benign 04/08/2016  . Depression 04/08/2016   Guss BundeKrystle Nilan Iddings, PT, DPT 12/03/2018, 9:18 AM  [facility] [address] [place], [place], [address] Phone: [phone]   Fax:  [phone]  Name: Tracy Jensen MRN: [id number] Date of Birth: 1955/08/04

## 2018-12-06 ENCOUNTER — Ambulatory Visit: Payer: 59 | Admitting: Physical Therapy

## 2018-12-06 ENCOUNTER — Other Ambulatory Visit: Payer: Self-pay

## 2018-12-06 ENCOUNTER — Encounter: Payer: Self-pay | Admitting: Physical Therapy

## 2018-12-06 ENCOUNTER — Telehealth: Payer: Self-pay | Admitting: Physician Assistant

## 2018-12-06 DIAGNOSIS — M6281 Muscle weakness (generalized): Secondary | ICD-10-CM

## 2018-12-06 DIAGNOSIS — M25611 Stiffness of right shoulder, not elsewhere classified: Secondary | ICD-10-CM

## 2018-12-06 DIAGNOSIS — M25511 Pain in right shoulder: Secondary | ICD-10-CM

## 2018-12-06 NOTE — Telephone Encounter (Signed)
Pt aware Rx is on file at [facility] for #90 with 11 RFs

## 2018-12-06 NOTE — Therapy (Signed)
[facility] [address] [place], [place], [address] Phone: [phone]   Fax:  [phone]  Physical Therapy Treatment  Patient Details  Name: Tracy Jensen MRN: [id number] Date of Birth: 14-Feb-1956 Referring Provider (PT): Norlene CampbellPeter Whitfield, MD   Encounter Date: 12/06/2018  PT End of Session - 12/06/18 0859    Visit Number  4    Number of Visits  18    Date for PT Re-Evaluation  01/17/19    Authorization Type  FOTO; Progress note every 10th visit    PT Start Time  0815    PT Stop Time  0905    PT Time Calculation (min)  50 min    Equipment Utilized During Treatment  Other (comment)   right Sling   Activity Tolerance  Patient tolerated treatment well    Behavior During Therapy  [facility] for tasks assessed/performed       Past Medical History:  Diagnosis Date  . Hypertension   . Migraines   . Sciatica   . TIA (transient ischemic attack)     History reviewed. No pertinent surgical history.  There were no vitals filed for this visit.  Subjective Assessment - 12/06/18 0858    Subjective  COVID-19 screening performed prior to patient entering the building. Patient reports feeling stiff and tight this morning.    Pertinent History  right RTC repair with SAD, DCR, and biceps tenodesis 11/11/2018, HTN, history of stroke    Limitations  Lifting;House hold activities;Other (comment)    Diagnostic tests  MRI: tear in right shoulder per patient    Patient Stated Goals  use arm without pain, take care of grandchild    Currently in Pain?  Yes    Pain Location  Shoulder    Pain Orientation  Right    Pain Descriptors / Indicators  Sore    Pain Type  Surgical pain    Pain Onset  1 to 4 weeks ago    Pain Frequency  Constant         [facility] PT Assessment - 12/06/18 0001      Assessment   Medical Diagnosis  Chronic R shoulder pain S/P scope    Referring Provider (PT)  Norlene CampbellPeter Whitfield, MD    Onset Date/Surgical Date  11/11/18    Hand  Dominance  Right    Next MD Visit  12/15/2018    Prior Therapy  yes      Precautions   Precautions  Shoulder    Type of Shoulder Precautions  Right RTC repair, mini open, SAD, DCR, biceps tenodesis    Precaution Comments  PROM for 4 weeks per MD      PROM   Right Shoulder Flexion  110 Degrees    Right Shoulder Internal Rotation  45 Degrees    Right Shoulder External Rotation  40 Degrees                   [facility] Adult PT Treatment/Exercise - 12/06/18 0001      Modalities   Modalities  Vasopneumatic;Electrical Stimulation      Electrical Stimulation   Electrical Stimulation Location  right shoulder    Electrical Stimulation Action  IFC    Electrical Stimulation Parameters  80-150 hz x15 mins    Electrical Stimulation Goals  Pain      Vasopneumatic   Number Minutes Vasopneumatic   15 minutes    Vasopnuematic Location   Shoulder    Vasopneumatic Pressure  Low  Vasopneumatic Temperature   50      Manual Therapy   Manual Therapy  Passive ROM    Passive ROM  PROM to right shoulder into flexion and ER to improve range; oscillations to promote muscle relaxation                  PT Long Term Goals - 12/06/18 0910      PT LONG TERM GOAL #1   Title  Patient will be independent with HEP and its progression.    Time  6    Period  Weeks    Status  Achieved      PT LONG TERM GOAL #2   Title  Patient will demonstrate  145+ degrees of right shoulder flexion AROM to improve ability to perform functional tasks.    Time  6    Period  Weeks    Status  On-going      PT LONG TERM GOAL #3   Title  Patient will demonstrate 4/5 or greater right UE strength in all planes to improve ability to perform functional activities.    Time  6    Period  Weeks    Status  On-going      PT LONG TERM GOAL #4   Title  Patient will report ability to perform ADLs and caregiving activities for her grandson with right shoulder pain less than or equal to 3/10.    Period  Weeks     Status  On-going      PT LONG TERM GOAL #5   Title  Patient will demonstrate 55+ degrees or greater of right shoulder ER AROM to improve ability to don/doff apparel.    Time  6    Period  Weeks    Status  On-going            Plan - 12/06/18 1610    Clinical Impression Statement  Patient arrived with ongoing stiffness in right shoulder. Patient noted with increased muscle guarding during PROM to which oscillations were performed to decrease guarding. Patient's PROM has improved since initial evaluation No adverse affects noted upon removal of modalities.    Personal Factors and Comorbidities  Comorbidity 1;Time since onset of injury/illness/exacerbation;Profession    Examination-Activity Limitations  Carry;Reach Overhead;Lift;Caring for Others;Dressing;Hygiene/Grooming    Examination-Participation Restrictions  Driving    Stability/Clinical Decision Making  Stable/Uncomplicated    Clinical Decision Making  Low    Rehab Potential  Good    PT Frequency  3x / week    PT Duration  6 weeks    PT Treatment/Interventions  Cryotherapy;ADLs/Self Care Home Management;Electrical Stimulation;Iontophoresis 4mg /ml Dexamethasone;Moist Heat;Ultrasound;Therapeutic activities;Therapeutic exercise;Manual techniques;Dry needling;Passive range of motion;Vasopneumatic Device;Taping;Patient/family education    PT Next Visit Plan  PROM to right shoulder per MD protocol Modalities PRN for pain relief    PT Home Exercise Plan  see patient education section    Consulted and Agree with Plan of Care  Patient       Patient will benefit from skilled therapeutic intervention in order to improve the following deficits and impairments:  Pain, Impaired UE functional use, Decreased strength, Decreased range of motion, Postural dysfunction, Decreased knowledge of precautions  Visit Diagnosis: 1. Acute pain of right shoulder   2. Stiffness of right shoulder, not elsewhere classified   3. Muscle weakness (generalized)         Problem List Patient Active Problem List   Diagnosis Date Noted  . GAD (generalized anxiety disorder) 11/15/2018  . Chronic  right shoulder pain 08/05/2018  . Abdominal pain 07/10/2018  . Chronic right-sided low back pain with right-sided sciatica 10/10/2017  . Acute recurrent frontal sinusitis 07/03/2017  . Olecranon bursitis of right elbow 07/14/2016  . Migraine 07/14/2016  . Sexual dysfunction 07/14/2016  . History of CVA (cerebrovascular accident) 04/08/2016  . HTN (hypertension), benign 04/08/2016  . Depression 04/08/2016   Guss BundeKrystle Symon Norwood, PT, DPT 12/06/2018, 9:11 AM  [facility] [address] [place], [place], [address] Phone: [phone]   Fax:  [phone]  Name: Tracy Jensen MRN: [id number] Date of Birth: Dec 13, 1955

## 2018-12-08 ENCOUNTER — Encounter: Payer: Self-pay | Admitting: Physical Therapy

## 2018-12-08 ENCOUNTER — Ambulatory Visit: Payer: 59 | Admitting: Physical Therapy

## 2018-12-08 ENCOUNTER — Other Ambulatory Visit: Payer: Self-pay

## 2018-12-08 DIAGNOSIS — M25511 Pain in right shoulder: Secondary | ICD-10-CM

## 2018-12-08 DIAGNOSIS — M6281 Muscle weakness (generalized): Secondary | ICD-10-CM

## 2018-12-08 DIAGNOSIS — M25611 Stiffness of right shoulder, not elsewhere classified: Secondary | ICD-10-CM

## 2018-12-08 NOTE — Therapy (Signed)
[facility] [place], [place], [address] Phone: [phone]   Fax:  [phone]  Physical Therapy Treatment  Patient Details  Name: Tracy Jensen MRN: [id number] Date of Birth: 05/31/1956 Referring Provider (PT): Joni Fears, MD   Encounter Date: 12/08/2018  PT End of Session - 12/08/18 0925    Visit Number  5    Number of Visits  18    Date for PT Re-Evaluation  01/17/19    Authorization Type  FOTO; Progress note every 10th visit    PT Start Time  0815    PT Stop Time  0900    PT Time Calculation (min)  45 min    Activity Tolerance  Patient tolerated treatment well    Behavior During Therapy  [facility] for tasks assessed/performed       Past Medical History:  Diagnosis Date  . Hypertension   . Migraines   . Sciatica   . TIA (transient ischemic attack)     History reviewed. No pertinent surgical history.  There were no vitals filed for this visit.  Subjective Assessment - 12/08/18 0909    Subjective  COVID-19 screening performed prior to patient entering the building. Patient reports ongoing stiffness.    Pertinent History  right RTC repair with SAD, DCR, and biceps tenodesis 11/11/2018, HTN, history of stroke    Limitations  Lifting;House hold activities;Other (comment)    Diagnostic tests  MRI: tear in right shoulder per patient    Patient Stated Goals  use arm without pain, take care of grandchild    Currently in Pain?  Yes    Pain Score  2     Pain Location  Shoulder    Pain Orientation  Right    Pain Descriptors / Indicators  Sore;Tightness    Pain Type  Surgical pain    Pain Onset  1 to 4 weeks ago    Pain Frequency  Constant         [facility] PT Assessment - 12/08/18 0001      Assessment   Medical Diagnosis  Chronic R shoulder pain S/P scope    Referring Provider (PT)  Joni Fears, MD    Onset Date/Surgical Date  11/11/18    Hand Dominance  Right    Next MD Visit  12/15/2018    Prior Therapy  yes       Precautions   Precautions  Shoulder    Type of Shoulder Precautions  Right RTC repair, mini open, SAD, DCR, biceps tenodesis    Precaution Comments  PROM for 4 weeks per MD                   [facility] Adult PT Treatment/Exercise - 12/08/18 0001      Modalities   Modalities  Vasopneumatic;Electrical Stimulation      Electrical Stimulation   Electrical Stimulation Location  right shoulder    Electrical Stimulation Action  IFC    Electrical Stimulation Parameters  80-150 hz x15 mins    Electrical Stimulation Goals  Pain      Vasopneumatic   Number Minutes Vasopneumatic   15 minutes    Vasopnuematic Location   Shoulder    Vasopneumatic Pressure  Low    Vasopneumatic Temperature   50      Manual Therapy   Manual Therapy  Passive ROM    Passive ROM  PROM to right shoulder into flexion and ER to improve range; oscillations to promote muscle relaxation  PT Long Term Goals - 12/06/18 0910      PT LONG TERM GOAL #1   Title  Patient will be independent with HEP and its progression.    Time  6    Period  Weeks    Status  Achieved      PT LONG TERM GOAL #2   Title  Patient will demonstrate  145+ degrees of right shoulder flexion AROM to improve ability to perform functional tasks.    Time  6    Period  Weeks    Status  On-going      PT LONG TERM GOAL #3   Title  Patient will demonstrate 4/5 or greater right UE strength in all planes to improve ability to perform functional activities.    Time  6    Period  Weeks    Status  On-going      PT LONG TERM GOAL #4   Title  Patient will report ability to perform ADLs and caregiving activities for her grandson with right shoulder pain less than or equal to 3/10.    Period  Weeks    Status  On-going      PT LONG TERM GOAL #5   Title  Patient will demonstrate 55+ degrees or greater of right shoulder ER AROM to improve ability to don/doff apparel.    Time  6    Period  Weeks    Status   On-going            Plan - 12/08/18 [id number]    Clinical Impression Statement  Patient tolerated treatment well with just reports of increased tightness toward end range. Patient noted with improved muscle relaxation during this session of PROM and required less oscillations throughout. Patient instructed to speak with HR in regards to her FMLA as she voiced concerns about returning to work. Patient reported understanding. Normal response to modalities upon removal.    Personal Factors and Comorbidities  Comorbidity 1;Time since onset of injury/illness/exacerbation;Profession    Examination-Activity Limitations  Carry;Reach Overhead;Lift;Caring for Others;Dressing;Hygiene/Grooming    Examination-Participation Restrictions  Driving    Stability/Clinical Decision Making  Stable/Uncomplicated    Clinical Decision Making  Low    Rehab Potential  Good    PT Frequency  3x / week    PT Duration  6 weeks    PT Treatment/Interventions  Cryotherapy;ADLs/Self Care Home Management;Electrical Stimulation;Iontophoresis 4mg /ml Dexamethasone;Moist Heat;Ultrasound;Therapeutic activities;Therapeutic exercise;Manual techniques;Dry needling;Passive range of motion;Vasopneumatic Device;Taping;Patient/family education    PT Next Visit Plan  PROM to right shoulder per MD protocol Modalities PRN for pain relief    PT Home Exercise Plan  see patient education section    Consulted and Agree with Plan of Care  Patient       Patient will benefit from skilled therapeutic intervention in order to improve the following deficits and impairments:  Pain, Impaired UE functional use, Decreased strength, Decreased range of motion, Postural dysfunction, Decreased knowledge of precautions  Visit Diagnosis: 1. Muscle weakness (generalized)   2. Stiffness of right shoulder, not elsewhere classified   3. Acute pain of right shoulder        Problem List Patient Active Problem List   Diagnosis Date Noted  . GAD (generalized  anxiety disorder) 11/15/2018  . Chronic right shoulder pain 08/05/2018  . Abdominal pain 07/10/2018  . Chronic right-sided low back pain with right-sided sciatica 10/10/2017  . Acute recurrent frontal sinusitis 07/03/2017  . Olecranon bursitis of right elbow 07/14/2016  . Migraine 07/14/2016  .  Sexual dysfunction 07/14/2016  . History of CVA (cerebrovascular accident) 04/08/2016  . HTN (hypertension), benign 04/08/2016  . Depression 04/08/2016    Guss BundeKrystle Tinya Cadogan, PT, DPT 12/08/2018, 10:40 AM  [facility] [address] [place], [place], [address] Phone: [phone]   Fax:  [phone]  Name: Tracy Jensen MRN: [id number] Date of Birth: 02-04-1956

## 2018-12-10 ENCOUNTER — Encounter: Payer: Self-pay | Admitting: Physical Therapy

## 2018-12-10 ENCOUNTER — Ambulatory Visit: Payer: 59 | Admitting: Physical Therapy

## 2018-12-10 ENCOUNTER — Other Ambulatory Visit: Payer: Self-pay

## 2018-12-10 DIAGNOSIS — M6281 Muscle weakness (generalized): Secondary | ICD-10-CM

## 2018-12-10 DIAGNOSIS — M25511 Pain in right shoulder: Secondary | ICD-10-CM | POA: Diagnosis not present

## 2018-12-10 DIAGNOSIS — M25611 Stiffness of right shoulder, not elsewhere classified: Secondary | ICD-10-CM

## 2018-12-10 NOTE — Therapy (Signed)
[facility] [address] [place], [place], [address] Phone: [phone]   Fax:  [phone]  Physical Therapy Treatment  Patient Details  Name: Tracy GessLynn H Graves MRN: [id number] Date of Birth: 1955-08-08 Referring Provider (PT): Norlene CampbellPeter Whitfield, MD   Encounter Date: 12/10/2018  PT End of Session - 12/10/18 0858    Visit Number  6    Number of Visits  18    Date for PT Re-Evaluation  01/17/19    Authorization Type  FOTO; Progress note every 10th visit    PT Start Time  0815    PT Stop Time  0900    PT Time Calculation (min)  45 min    Activity Tolerance  Patient tolerated treatment well    Behavior During Therapy  [facility] for tasks assessed/performed       Past Medical History:  Diagnosis Date  . Hypertension   . Migraines   . Sciatica   . TIA (transient ischemic attack)     History reviewed. No pertinent surgical history.  There were no vitals filed for this visit.  Subjective Assessment - 12/10/18 0818    Subjective  COVID-19 screening performed prior to patient entering the building. Patient reports she is compliant with HEP and thinks she shouldn't be as stiff because she is performing her HEP but she is.    Pertinent History  right RTC repair with SAD, DCR, and biceps tenodesis 11/11/2018, HTN, history of stroke    Limitations  Lifting;House hold activities;Other (comment)    Diagnostic tests  MRI: tear in right shoulder per patient    Patient Stated Goals  use arm without pain, take care of grandchild    Currently in Pain?  Yes    Pain Score  2     Pain Location  Shoulder    Pain Orientation  Right    Pain Descriptors / Indicators  Tightness;Sore    Pain Type  Surgical pain    Pain Onset  1 to 4 weeks ago    Pain Frequency  Constant         [facility] PT Assessment - 12/10/18 0001      Assessment   Medical Diagnosis  Chronic R shoulder pain S/P scope    Referring Provider (PT)  Norlene CampbellPeter Whitfield, MD    Onset Date/Surgical  Date  11/11/18    Hand Dominance  Right    Next MD Visit  12/15/2018    Prior Therapy  yes      Precautions   Precautions  Shoulder    Type of Shoulder Precautions  Right RTC repair, mini open, SAD, DCR, biceps tenodesis    Precaution Comments  PROM for 4 weeks per MD                   [facility]/Exercise - 12/10/18 0001      Modalities   Modalities  Vasopneumatic;Electrical Stimulation      Electrical Stimulation   Electrical Stimulation Location  right shoulder    Electrical Stimulation Action  IFC    Electrical Stimulation Parameters  80-150 hz x15 mins    Electrical Stimulation Goals  Pain      Vasopneumatic   Number Minutes Vasopneumatic   15 minutes    Vasopnuematic Location   Shoulder    Vasopneumatic Pressure  Low    Vasopneumatic Temperature   50      Manual Therapy   Manual Therapy  Passive ROM    Passive  ROM  PROM to right shoulder into flexion and ER to improve range; oscillations to promote muscle relaxation                  PT Long Term Goals - 12/06/18 0910      PT LONG TERM GOAL #1   Title  Patient will be independent with HEP and its progression.    Time  6    Period  Weeks    Status  Achieved      PT LONG TERM GOAL #2   Title  Patient will demonstrate  145+ degrees of right shoulder flexion AROM to improve ability to perform functional tasks.    Time  6    Period  Weeks    Status  On-going      PT LONG TERM GOAL #3   Title  Patient will demonstrate 4/5 or greater right UE strength in all planes to improve ability to perform functional activities.    Time  6    Period  Weeks    Status  On-going      PT LONG TERM GOAL #4   Title  Patient will report ability to perform ADLs and caregiving activities for her grandson with right shoulder pain less than or equal to 3/10.    Period  Weeks    Status  On-going      PT LONG TERM GOAL #5   Title  Patient will demonstrate 55+ degrees or greater of right shoulder ER  AROM to improve ability to don/doff apparel.    Time  6    Period  Weeks    Status  On-going            Plan - 12/10/18 0902    Clinical Impression Statement  Patient was able to tolerate treatment well but with reports of increased soreness and tightness toward end range. Patient noted with more muscle guarding with PROM right flexion but overall patient noted with smooth PROM. Patient stated she knew she was not supposed to be driving but has been stating she has no one to take her around. Patient instructed to follow MD's recommendations and was educated how unsafe it is. Patient did not completely report an understanding or compliancy and responded by saying she drives carefully and her home is only 12 miles away. No adverse affects noted upon removal.    Personal Factors and Comorbidities  Comorbidity 1;Time since onset of injury/illness/exacerbation;Profession    Examination-Activity Limitations  Carry;Reach Overhead;Lift;Caring for Others;Dressing;Hygiene/Grooming    Examination-Participation Restrictions  Driving    Stability/Clinical Decision Making  Stable/Uncomplicated    Clinical Decision Making  Low    Rehab Potential  Good    PT Frequency  3x / week    PT Duration  6 weeks    PT Treatment/Interventions  Cryotherapy;ADLs/Self Care Home Management;Electrical Stimulation;Iontophoresis 4mg /ml Dexamethasone;Moist Heat;Ultrasound;Therapeutic activities;Therapeutic exercise;Manual techniques;Dry needling;Passive range of motion;Vasopneumatic Device;Taping;Patient/family education    PT Next Visit Plan  MD note next visit; PROM to right shoulder per MD protocol Modalities PRN for pain relief    PT Home Exercise Plan  see patient education section    Consulted and Agree with Plan of Care  Patient       Patient will benefit from skilled therapeutic intervention in order to improve the following deficits and impairments:  Pain, Impaired UE functional use, Decreased strength, Decreased  range of motion, Postural dysfunction, Decreased knowledge of precautions  Visit Diagnosis: 1. Muscle weakness (generalized)   2.  Stiffness of right shoulder, not elsewhere classified   3. Acute pain of right shoulder        Problem List Patient Active Problem List   Diagnosis Date Noted  . GAD (generalized anxiety disorder) 11/15/2018  . Chronic right shoulder pain 08/05/2018  . Abdominal pain 07/10/2018  . Chronic right-sided low back pain with right-sided sciatica 10/10/2017  . Acute recurrent frontal sinusitis 07/03/2017  . Olecranon bursitis of right elbow 07/14/2016  . Migraine 07/14/2016  . Sexual dysfunction 07/14/2016  . History of CVA (cerebrovascular accident) 04/08/2016  . HTN (hypertension), benign 04/08/2016  . Depression 04/08/2016   [name], PT, DPT 12/10/2018, 10:14 AM  [facility] [address] [place], [place], [address] Phone: [phone]   Fax:  [phone]  Name: KAYLIA WINBORNE MRN: [id number] Date of Birth: 1956/03/12

## 2018-12-13 ENCOUNTER — Other Ambulatory Visit: Payer: Self-pay

## 2018-12-13 ENCOUNTER — Encounter: Payer: Self-pay | Admitting: Physical Therapy

## 2018-12-13 ENCOUNTER — Ambulatory Visit: Payer: 59 | Admitting: Physical Therapy

## 2018-12-13 DIAGNOSIS — M25611 Stiffness of right shoulder, not elsewhere classified: Secondary | ICD-10-CM

## 2018-12-13 DIAGNOSIS — M25511 Pain in right shoulder: Secondary | ICD-10-CM

## 2018-12-13 DIAGNOSIS — M6281 Muscle weakness (generalized): Secondary | ICD-10-CM

## 2018-12-13 NOTE — Therapy (Signed)
[facility] [address] [place], [place], [address] Phone: [phone]   Fax:  [phone]  Physical Therapy Treatment  Patient Details  Name: Tracy Jensen MRN: [id number] Date of Birth: 1956/06/02 Referring Provider (PT): Norlene CampbellPeter Whitfield, MD   Encounter Date: 12/13/2018  PT End of Session - 12/13/18 0852    Visit Number  7    Number of Visits  18    Date for PT Re-Evaluation  01/17/19    Authorization Type  FOTO; Progress note every 10th visit    PT Start Time  0815    PT Stop Time  0901    PT Time Calculation (min)  46 min    Equipment Utilized During Treatment  Other (comment)   right sling   Activity Tolerance  Patient tolerated treatment well    Behavior During Therapy  [facility]FL for tasks assessed/performed       Past Medical History:  Diagnosis Date  . Hypertension   . Migraines   . Sciatica   . TIA (transient ischemic attack)     History reviewed. No pertinent surgical history.  There were no vitals filed for this visit.  Subjective Assessment - 12/13/18 0850    Subjective  COVID-19 screening performed prior to patient entering the building. Patient arrived feeling more stiff this morning and over the past weekend. Stated she did not do anything unusual other than her normal grocery shopping trips.    Pertinent History  right RTC repair with SAD, DCR, and biceps tenodesis 11/11/2018, HTN, history of stroke    Limitations  Lifting;House hold activities;Other (comment)    Diagnostic tests  MRI: tear in right shoulder per patient    Patient Stated Goals  use arm without pain, take care of grandchild    Currently in Pain?  Yes    Pain Score  3     Pain Location  Shoulder    Pain Orientation  Right    Pain Descriptors / Indicators  Sore;Tightness    Pain Type  Surgical pain    Pain Onset  1 to 4 weeks ago    Pain Frequency  Constant         [facility] PT Assessment - 12/13/18 0001      PROM   Right Shoulder Flexion  116  Degrees    Right Shoulder ABduction  92 Degrees    Right Shoulder Internal Rotation  60 Degrees    Right Shoulder External Rotation  48 Degrees                                PT Long Term Goals - 12/13/18 0929      PT LONG TERM GOAL #1   Title  Patient will be independent with HEP and its progression.    Time  6    Period  Weeks    Status  On-going      PT LONG TERM GOAL #2   Title  Patient will demonstrate  145+ degrees of right shoulder flexion AROM to improve ability to perform functional tasks.    Time  6    Period  Weeks    Status  On-going      PT LONG TERM GOAL #3   Title  Patient will demonstrate 4/5 or greater right UE strength in all planes to improve ability to perform functional activities.    Time  6    Period  Weeks    Status  On-going      PT LONG TERM GOAL #4   Title  Patient will report ability to perform ADLs and caregiving activities for her grandson with right shoulder pain less than or equal to 3/10.    Time  6    Period  Weeks    Status  On-going      PT LONG TERM GOAL #5   Title  Patient will demonstrate 55+ degrees or greater of right shoulder ER AROM to improve ability to don/doff apparel.    Time  6    Period  Weeks    Status  On-going            Plan - 12/13/18 2130    Clinical Impression Statement  Patient presents to physical therapy with increased tightness towards end range. Patient noted with increased muscle guarding with right shoulder PROM ER but was able to relax with oscillations. Patient noted with improvements in PROM, see measurements. Patient asked if she could remove the sling but instructed to wait until follow up visit with surgeon. Patient reported understanding. No adverse affects noted upon removal of modalities. Patient was observed driving leaving the clinic despite educating last visit about driving. Patient stated last visit is aware she is unable to drive per MD but still drives because she has  no one to take her around.    Personal Factors and Comorbidities  Comorbidity 1;Time since onset of injury/illness/exacerbation;Profession    Examination-Activity Limitations  Carry;Reach Overhead;Lift;Caring for Others;Dressing;Hygiene/Grooming    Examination-Participation Restrictions  Driving    Stability/Clinical Decision Making  Stable/Uncomplicated    Clinical Decision Making  Low    Rehab Potential  Good    PT Frequency  3x / week    PT Duration  6 weeks    PT Treatment/Interventions  Cryotherapy;ADLs/Self Care Home Management;Electrical Stimulation;Iontophoresis 4mg /ml Dexamethasone;Moist Heat;Ultrasound;Therapeutic activities;Therapeutic exercise;Manual techniques;Dry needling;Passive range of motion;Vasopneumatic Device;Taping;Patient/family education    PT Next Visit Plan  PROM to right shoulder per MD protocol Modalities PRN for pain relief    Consulted and Agree with Plan of Care  Patient       Patient will benefit from skilled therapeutic intervention in order to improve the following deficits and impairments:  Pain, Impaired UE functional use, Decreased strength, Decreased range of motion, Postural dysfunction, Decreased knowledge of precautions  Visit Diagnosis: 1. Muscle weakness (generalized)   2. Stiffness of right shoulder, not elsewhere classified   3. Acute pain of right shoulder        Problem List Patient Active Problem List   Diagnosis Date Noted  . GAD (generalized anxiety disorder) 11/15/2018  . Chronic right shoulder pain 08/05/2018  . Abdominal pain 07/10/2018  . Chronic right-sided low back pain with right-sided sciatica 10/10/2017  . Acute recurrent frontal sinusitis 07/03/2017  . Olecranon bursitis of right elbow 07/14/2016  . Migraine 07/14/2016  . Sexual dysfunction 07/14/2016  . History of CVA (cerebrovascular accident) 04/08/2016  . HTN (hypertension), benign 04/08/2016  . Depression 04/08/2016   Tracy Jensen, PT, DPT 12/13/2018, 9:29  AM  [facility] [place], [place], [address] Phone: [phone]   Fax:  [phone]  Name: Tracy FORTENBERRY MRN: [id number] Date of Birth: 08-Apr-1956

## 2018-12-15 ENCOUNTER — Encounter: Payer: 59 | Admitting: Physical Therapy

## 2018-12-15 ENCOUNTER — Ambulatory Visit: Payer: 59 | Attending: Orthopaedic Surgery | Admitting: Physical Therapy

## 2018-12-15 ENCOUNTER — Other Ambulatory Visit: Payer: Self-pay

## 2018-12-15 ENCOUNTER — Encounter: Payer: Self-pay | Admitting: Orthopaedic Surgery

## 2018-12-15 ENCOUNTER — Ambulatory Visit (INDEPENDENT_AMBULATORY_CARE_PROVIDER_SITE_OTHER): Payer: 59 | Admitting: Orthopaedic Surgery

## 2018-12-15 ENCOUNTER — Encounter: Payer: Self-pay | Admitting: Physical Therapy

## 2018-12-15 VITALS — BP 104/54 | HR 79 | Ht <= 58 in | Wt 110.0 lb

## 2018-12-15 DIAGNOSIS — M25511 Pain in right shoulder: Secondary | ICD-10-CM

## 2018-12-15 DIAGNOSIS — M25611 Stiffness of right shoulder, not elsewhere classified: Secondary | ICD-10-CM | POA: Diagnosis present

## 2018-12-15 DIAGNOSIS — M6281 Muscle weakness (generalized): Secondary | ICD-10-CM | POA: Diagnosis not present

## 2018-12-15 DIAGNOSIS — G8929 Other chronic pain: Secondary | ICD-10-CM

## 2018-12-15 NOTE — Therapy (Signed)
[facility] [address] [place], [place], [address] Phone: [phone]   Fax:  [phone]  Physical Therapy Treatment  Patient Details  Name: Tracy Jensen MRN: [id number] Date of Birth: [birth year] Referring Provider (PT): Norlene CampbellPeter Whitfield, MD   Encounter Date: 12/15/2018  PT End of Session - 12/15/18 0852    Visit Number  8    Number of Visits  18    Date for PT Re-Evaluation  01/17/19    Authorization Type  FOTO; Progress note every 10th visit    PT Start Time  0815    PT Stop Time  0903    PT Time Calculation (min)  48 min    Equipment Utilized During Treatment  Other (comment)   sling   Activity Tolerance  Patient tolerated treatment well    Behavior During Therapy  [facility]FL for tasks assessed/performed       Past Medical History:  Diagnosis Date  . Hypertension   . Migraines   . Sciatica   . TIA (transient ischemic attack)     History reviewed. No pertinent surgical history.  There were no vitals filed for this visit.  Subjective Assessment - 12/15/18 0850    Subjective  COVID-19 screening performed prior to patient entering the building. Patient reported having performed HEP prior to coming to PT.    Pertinent History  right RTC repair with SAD, DCR, and biceps tenodesis 11/11/2018, HTN, history of stroke    Limitations  Lifting;House hold activities;Other (comment)    Diagnostic tests  MRI: tear in right shoulder per patient    Patient Stated Goals  use arm without pain, take care of grandchild    Currently in Pain?  Yes    Pain Score  2     Pain Orientation  Right    Pain Descriptors / Indicators  Sore    Pain Type  Surgical pain    Pain Onset  1 to 4 weeks ago    Pain Frequency  Constant         [facility] PT Assessment - 12/15/18 0001      Assessment   Medical Diagnosis  Chronic R shoulder pain S/P scope    Referring Provider (PT)  Norlene CampbellPeter Whitfield, MD    Onset Date/Surgical Date  11/11/18    Hand Dominance   Right    Next MD Visit  12/15/2018    Prior Therapy  yes                   [facility] Adult PT Treatment/Exercise - 12/15/18 0001      Modalities   Modalities  Vasopneumatic;Electrical Stimulation      Electrical Stimulation   Electrical Stimulation Location  right shoulder    Electrical Stimulation Action  IFC    Electrical Stimulation Parameters  80-150 hz x15 min    Electrical Stimulation Goals  Pain      Vasopneumatic   Number Minutes Vasopneumatic   15 minutes    Vasopnuematic Location   Shoulder    Vasopneumatic Pressure  Low    Vasopneumatic Temperature   34      Manual Therapy   Manual Therapy  Passive ROM    Passive ROM  PROM to right shoulder into flexion and ER to improve range; oscillations to promote muscle relaxation                  PT Long Term Goals - 12/13/18 [id number]      PT  LONG TERM GOAL #1   Title  Patient will be independent with HEP and its progression.    Time  6    Period  Weeks    Status  On-going      PT LONG TERM GOAL #2   Title  Patient will demonstrate  145+ degrees of right shoulder flexion AROM to improve ability to perform functional tasks.    Time  6    Period  Weeks    Status  On-going      PT LONG TERM GOAL #3   Title  Patient will demonstrate 4/5 or greater right UE strength in all planes to improve ability to perform functional activities.    Time  6    Period  Weeks    Status  On-going      PT LONG TERM GOAL #4   Title  Patient will report ability to perform ADLs and caregiving activities for her grandson with right shoulder pain less than or equal to 3/10.    Time  6    Period  Weeks    Status  On-going      PT LONG TERM GOAL #5   Title  Patient will demonstrate 55+ degrees or greater of right shoulder ER AROM to improve ability to don/doff apparel.    Time  6    Period  Weeks    Status  On-going            Plan - 12/15/18 7591    Clinical Impression Statement  Patient was able to tolerate  treatment well with ongoing reports of soreness and tightness toward end range. Patient noted with less muscle guarding throughout right ER PROM. Patient to see MD today. No adverse affects noted upon removal of modalities.    Personal Factors and Comorbidities  Comorbidity 1;Time since onset of injury/illness/exacerbation;Profession    Examination-Activity Limitations  Carry;Reach Overhead;Lift;Caring for Others;Dressing;Hygiene/Grooming    Examination-Participation Restrictions  Driving    Stability/Clinical Decision Making  Stable/Uncomplicated    Clinical Decision Making  Low    Rehab Potential  Good    PT Frequency  3x / week    PT Duration  6 weeks    PT Treatment/Interventions  Cryotherapy;ADLs/Self Care Home Management;Electrical Stimulation;Iontophoresis 4mg /ml Dexamethasone;Moist Heat;Ultrasound;Therapeutic activities;Therapeutic exercise;Manual techniques;Dry needling;Passive range of motion;Vasopneumatic Device;Taping;Patient/family education    PT Next Visit Plan  PROM to right shoulder per MD protocol Modalities PRN for pain relief    PT Home Exercise Plan  see patient education section    Consulted and Agree with Plan of Care  Patient       Patient will benefit from skilled therapeutic intervention in order to improve the following deficits and impairments:  Pain, Impaired UE functional use, Decreased strength, Decreased range of motion, Postural dysfunction, Decreased knowledge of precautions  Visit Diagnosis: 1. Muscle weakness (generalized)   2. Stiffness of right shoulder, not elsewhere classified   3. Acute pain of right shoulder        Problem List Patient Active Problem List   Diagnosis Date Noted  . GAD (generalized anxiety disorder) 11/15/2018  . Chronic right shoulder pain 08/05/2018  . Abdominal pain 07/10/2018  . Chronic right-sided low back pain with right-sided sciatica 10/10/2017  . Acute recurrent frontal sinusitis 07/03/2017  . Olecranon bursitis of  right elbow 07/14/2016  . Migraine 07/14/2016  . Sexual dysfunction 07/14/2016  . History of CVA (cerebrovascular accident) 04/08/2016  . HTN (hypertension), benign 04/08/2016  . Depression 04/08/2016  Guss BundeKrystle Abygail Galeno, PT, DPT 12/15/2018, 9:13 AM  [facility] [address] [place], [place], [address] Phone: [phone]   Fax:  [phone]  Name: Tracy Jensen MRN: [id number] Date of Birth: [birth year]

## 2018-12-15 NOTE — Progress Notes (Signed)
Office Visit Note   Patient: Tracy Jensen           Date of Birth: December 03, 1955           MRN: [id number] Visit Date: 12/15/2018              Requested by: Terald Sleeper, PA-C [address] [place],  [place] [address] PCP: Terald Sleeper, PA-C   Assessment & Plan: Visit Diagnoses:  1. Chronic right shoulder pain     Plan: 5 weeks status post rotator cuff tear repair with an arthroscopic SCD and DCR.  Doing well.  Very minimal pain.  Has been going to physical therapy and working on passive range of motion.  Has some tightness lacking about 30 to 40 degrees of full overhead motion and needs to have aggressive therapy.  I outlined exercises for both she and her husband.  May discontinue the sling and return in 1 month.  No work  Follow-Up Instructions: Return in about 1 month (around 01/15/2019).   Orders:  No orders of the defined types were placed in this encounter.  No orders of the defined types were placed in this encounter.     Procedures: No procedures performed   Clinical Data: No additional findings.   Subjective: Chief Complaint  Patient presents with  . Right Shoulder - Follow-up    11/11/2018 Right Shoulder Arthroscopy  Patient returns for follow up. She is status post right shoulder arthroscopy with rotator cuff repair on 11/11/2018. She states that she is doing well and would like to discontinue the sling. She is currently going to physical therapy which is going well. She is still unable to raise her arm overhead.  She denies taking anything for pain.  Will be required to lift up to 25 pounds when she returns to work and she is not ready yet  HPI  Review of Systems   Objective: Vital Signs: BP (!) 104/54   Pulse 79   Ht 4\' 9"  (1.448 m)   Wt 110 lb (49.9 kg)   BMI 23.80 kg/m   Physical Exam  Ortho Exam right shoulder incisions healing nicely.  Good grip and good release.  Skin intact right shoulder.  I can abduct 90 degrees flex passively to about 40  degrees to full overhead motion.  No crepitation.  No pain with impingement testing  Specialty Comments:  No specialty comments available.  Imaging: No results found.   PMFS History: Patient Active Problem List   Diagnosis Date Noted  . GAD (generalized anxiety disorder) 11/15/2018  . Chronic right shoulder pain 08/05/2018  . Abdominal pain 07/10/2018  . Chronic right-sided low back pain with right-sided sciatica 10/10/2017  . Acute recurrent frontal sinusitis 07/03/2017  . Olecranon bursitis of right elbow 07/14/2016  . Migraine 07/14/2016  . Sexual dysfunction 07/14/2016  . History of CVA (cerebrovascular accident) 04/08/2016  . HTN (hypertension), benign 04/08/2016  . Depression 04/08/2016   Past Medical History:  Diagnosis Date  . Hypertension   . Migraines   . Sciatica   . TIA (transient ischemic attack)     History reviewed. No pertinent family history.  History reviewed. No pertinent surgical history. Social History   Occupational History  . Not on file  Tobacco Use  . Smoking status: Never Smoker  . Smokeless tobacco: Never Used  Substance and Sexual Activity  . Alcohol use: Yes    Comment: occ  . Drug use: No  . Sexual activity: Not  on file

## 2018-12-21 ENCOUNTER — Encounter: Payer: Self-pay | Admitting: Physical Therapy

## 2018-12-21 ENCOUNTER — Other Ambulatory Visit: Payer: Self-pay

## 2018-12-21 ENCOUNTER — Ambulatory Visit: Payer: 59 | Admitting: Physical Therapy

## 2018-12-21 DIAGNOSIS — M25611 Stiffness of right shoulder, not elsewhere classified: Secondary | ICD-10-CM

## 2018-12-21 DIAGNOSIS — M6281 Muscle weakness (generalized): Secondary | ICD-10-CM | POA: Diagnosis not present

## 2018-12-21 DIAGNOSIS — M25511 Pain in right shoulder: Secondary | ICD-10-CM

## 2018-12-21 NOTE — Therapy (Signed)
[facility] [address] [place], [place], [address] Phone: [phone]   Fax:  [phone]  Physical Therapy Treatment  Patient Details  Name: Rosalyn GessLynn H Lamour MRN: [id number] Date of Birth: January 29, 1956 Referring Provider (PT): Norlene CampbellPeter Whitfield, MD   Encounter Date: 12/21/2018  PT End of Session - 12/21/18 0819    Visit Number  9    Number of Visits  18    Date for PT Re-Evaluation  01/17/19    Authorization Type  FOTO; Progress note every 10th visit    PT Start Time  0815    PT Stop Time  0902    PT Time Calculation (min)  47 min    Activity Tolerance  Patient tolerated treatment well    Behavior During Therapy  [facility] for tasks assessed/performed       Past Medical History:  Diagnosis Date  . Hypertension   . Migraines   . Sciatica   . TIA (transient ischemic attack)     History reviewed. No pertinent surgical history.  There were no vitals filed for this visit.  Subjective Assessment - 12/21/18 0819    Subjective  COVID-19 screening performed prior to patient entering the building. Patient reported MD's follow up went well and has been discharged from the sling. Denies pain but reports stiffness    Pertinent History  right RTC repair with SAD, DCR, and biceps tenodesis 11/11/2018, HTN, history of stroke    Limitations  Lifting;House hold activities;Other (comment)    Diagnostic tests  MRI: tear in right shoulder per patient    Patient Stated Goals  use arm without pain, take care of grandchild    Currently in Pain?  No/denies         [facility] - 12/21/18 0001      Assessment   Medical Diagnosis  Chronic R shoulder pain S/P scope    Referring Provider (PT)  Norlene CampbellPeter Whitfield, MD    Onset Date/Surgical Date  11/11/18    Hand Dominance  Right    Next MD Visit  01/12/2019    Prior Therapy  yes                   [facility]/Exercise - 12/21/18 0001      Shoulder Exercises: Supine   External  Rotation  AAROM;Right;10 reps   10" hold   Flexion  AAROM;10 reps   10" hold     Shoulder Exercises: Pulleys   Flexion  5 minutes      Shoulder Exercises: ROM/Strengthening   Ranger  UE ranger in flexion, CW, CCW, x3 mins each      Modalities   Modalities  Vasopneumatic;Electrical Stimulation      Electrical Stimulation   Electrical Stimulation Location  right shoulder    Electrical Stimulation Action  IFC    Electrical Stimulation Parameters  80-150 hz x10 mins    Electrical Stimulation Goals  Pain      Vasopneumatic   Number Minutes Vasopneumatic   10 minutes    Vasopnuematic Location   Shoulder    Vasopneumatic Pressure  Low    Vasopneumatic Temperature   34      Manual Therapy   Manual Therapy  Passive ROM    Passive ROM  PROM to right shoulder into flexion and ER to improve range; oscillations to promote muscle relaxation                  PT Long Term  Goals - 12/13/18 0929      PT LONG TERM GOAL #1   Title  Patient will be independent with HEP and its progression.    Time  6    Period  Weeks    Status  On-going      PT LONG TERM GOAL #2   Title  Patient will demonstrate  145+ degrees of right shoulder flexion AROM to improve ability to perform functional tasks.    Time  6    Period  Weeks    Status  On-going      PT LONG TERM GOAL #3   Title  Patient will demonstrate 4/5 or greater right UE strength in all planes to improve ability to perform functional activities.    Time  6    Period  Weeks    Status  On-going      PT LONG TERM GOAL #4   Title  Patient will report ability to perform ADLs and caregiving activities for her grandson with right shoulder pain less than or equal to 3/10.    Time  6    Period  Weeks    Status  On-going      PT LONG TERM GOAL #5   Title  Patient will demonstrate 55+ degrees or greater of right shoulder ER AROM to improve ability to don/doff apparel.    Time  6    Period  Weeks    Status  On-going             Plan - 12/21/18 0914    Clinical Impression Statement  Patient was able to tolerate treatment fairly well but with ongoing reports of stiffness. Patient required cuing for technique for R shoulder AAROM exercises especially for reducing speed of exercise. Patient educated again about protocol, importance of following for protection of the surgery, and strengthening exercises will not be performed at this time. Patient educated on emphasis on ROM before strengthening. Patient reported understanding. No adverse affects noted upon removal of modalities.    Personal Factors and Comorbidities  Comorbidity 1;Time since onset of injury/illness/exacerbation;Profession    Examination-Activity Limitations  Carry;Reach Overhead;Lift;Caring for Others;Dressing;Hygiene/Grooming    Examination-Participation Restrictions  Driving    Stability/Clinical Decision Making  Stable/Uncomplicated    Clinical Decision Making  Low    Rehab Potential  Good    PT Frequency  3x / week    PT Duration  6 weeks    PT Treatment/Interventions  Cryotherapy;ADLs/Self Care Home Management;Electrical Stimulation;Iontophoresis 4mg /ml Dexamethasone;Moist Heat;Ultrasound;Therapeutic activities;Therapeutic exercise;Manual techniques;Dry needling;Passive range of motion;Vasopneumatic Device;Taping;Patient/family education    PT Next Visit Plan  PROM to right shoulder per MD protocol, AAROM Modalities PRN for pain relief    Consulted and Agree with Plan of Care  Patient       Patient will benefit from skilled therapeutic intervention in order to improve the following deficits and impairments:  Pain, Impaired UE functional use, Decreased strength, Decreased range of motion, Postural dysfunction, Decreased knowledge of precautions  Visit Diagnosis: 1. Muscle weakness (generalized)   2. Stiffness of right shoulder, not elsewhere classified   3. Acute pain of right shoulder        Problem List Patient Active Problem  List   Diagnosis Date Noted  . GAD (generalized anxiety disorder) 11/15/2018  . Chronic right shoulder pain 08/05/2018  . Abdominal pain 07/10/2018  . Chronic right-sided low back pain with right-sided sciatica 10/10/2017  . Acute recurrent frontal sinusitis 07/03/2017  . Olecranon bursitis of right  elbow 07/14/2016  . Migraine 07/14/2016  . Sexual dysfunction 07/14/2016  . History of CVA (cerebrovascular accident) 04/08/2016  . HTN (hypertension), benign 04/08/2016  . Depression 04/08/2016   [name], PT, DPT 12/21/2018, 9:28 AM  [facility] [place], [place], [address] Phone: [phone]   Fax:  [phone]  Name: ECKO BEASLEY MRN: [id number] Date of Birth: [birth year]

## 2018-12-22 ENCOUNTER — Other Ambulatory Visit: Payer: Self-pay

## 2018-12-22 ENCOUNTER — Encounter: Payer: Self-pay | Admitting: Physical Therapy

## 2018-12-22 ENCOUNTER — Ambulatory Visit: Payer: 59 | Admitting: Physical Therapy

## 2018-12-22 DIAGNOSIS — M25511 Pain in right shoulder: Secondary | ICD-10-CM

## 2018-12-22 DIAGNOSIS — M6281 Muscle weakness (generalized): Secondary | ICD-10-CM | POA: Diagnosis not present

## 2018-12-22 DIAGNOSIS — M25611 Stiffness of right shoulder, not elsewhere classified: Secondary | ICD-10-CM

## 2018-12-22 NOTE — Therapy (Signed)
[facility] [place], [place], [address] Phone: [phone]   Fax:  [phone]  Physical Therapy Treatment   Progress Note Reporting Period 11/29/2018 to 12/22/2018  See note below for Objective Data and Assessment of Progress/Goals.  Patient is progressing well but requires constant education regarding protocol. [name], PT, DPT   Patient Details  Name: Tracy Jensen MRN: [id number] Date of Birth: 1956/06/03 Referring Provider (PT): Joni Fears, MD   Encounter Date: 12/22/2018  PT End of Session - 12/22/18 0834    Visit Number  10    Number of Visits  18    Date for PT Re-Evaluation  01/17/19    Authorization Type  FOTO; Progress note every 10th visit    PT Start Time  0815    PT Stop Time  0906    PT Time Calculation (min)  51 min    Activity Tolerance  Patient tolerated treatment well    Behavior During Therapy  [facility] for tasks assessed/performed       Past Medical History:  Diagnosis Date  . Hypertension   . Migraines   . Sciatica   . TIA (transient ischemic attack)     History reviewed. No pertinent surgical history.  There were no vitals filed for this visit.  Subjective Assessment - 12/22/18 0833    Subjective  COVID-19 screening performed prior to patient entering the building. Patient reported feeling stiff and asked to review one of her HEPs.    Pertinent History  right RTC repair with SAD, DCR, and biceps tenodesis 11/11/2018, HTN, history of stroke    Limitations  Lifting;House hold activities;Other (comment)    Diagnostic tests  MRI: tear in right shoulder per patient    Patient Stated Goals  use arm without pain, take care of grandchild    Currently in Pain?  No/denies         [facility] PT Assessment - 12/22/18 0001      Assessment   Medical Diagnosis  Chronic R shoulder pain S/P scope    Referring Provider (PT)  Joni Fears, MD    Onset Date/Surgical Date  11/11/18    Hand  Dominance  Right    Next MD Visit  01/12/2019    Prior Therapy  yes      Precautions   Precautions  Shoulder    Type of Shoulder Precautions  Right RTC repair, mini open, SAD, DCR, biceps tenodesis      Observation/Other Assessments   Focus on Therapeutic Outcomes (FOTO)   48% limited      PROM   Right Shoulder Flexion  122 Degrees    Right Shoulder ABduction  105 Degrees    Right Shoulder External Rotation  48 Degrees                   [facility] Adult PT Treatment/Exercise - 12/22/18 0001      Shoulder Exercises: Supine   Protraction  AROM;20 reps    External Rotation  AAROM;Right;10 reps   10" hold   Flexion  AAROM;10 reps   10" hold     Shoulder Exercises: Pulleys   Flexion  5 minutes      Shoulder Exercises: ROM/Strengthening   Ranger  UE ranger in flexion, CW, CCW, x3 mins each      Modalities   Modalities  Vasopneumatic;Electrical Stimulation      Electrical Stimulation   Electrical Stimulation Location  right shoulder    Electrical Stimulation Action  IFC    Electrical Stimulation Parameters  80-150 hz x10 mins    Electrical Stimulation Goals  Pain      Vasopneumatic   Number Minutes Vasopneumatic   10 minutes    Vasopnuematic Location   Shoulder    Vasopneumatic Pressure  Low    Vasopneumatic Temperature   34      Manual Therapy   Manual Therapy  Passive ROM    Passive ROM  PROM to right shoulder into flexion and ER to improve range; oscillations to promote muscle relaxation                  PT Long Term Goals - 12/13/18 0929      PT LONG TERM GOAL #1   Title  Patient will be independent with HEP and its progression.    Time  6    Period  Weeks    Status  On-going      PT LONG TERM GOAL #2   Title  Patient will demonstrate  145+ degrees of right shoulder flexion AROM to improve ability to perform functional tasks.    Time  6    Period  Weeks    Status  On-going      PT LONG TERM GOAL #3   Title  Patient will demonstrate 4/5  or greater right UE strength in all planes to improve ability to perform functional activities.    Time  6    Period  Weeks    Status  On-going      PT LONG TERM GOAL #4   Title  Patient will report ability to perform ADLs and caregiving activities for her grandson with right shoulder pain less than or equal to 3/10.    Time  6    Period  Weeks    Status  On-going      PT LONG TERM GOAL #5   Title  Patient will demonstrate 55+ degrees or greater of right shoulder ER AROM to improve ability to don/doff apparel.    Time  6    Period  Weeks    Status  On-going            Plan - 12/22/18 [id number]    Clinical Impression Statement  Patient was able to tolerate treatment well with reports of referred pain to lateral arm. Patient was educated on referral of pain. Patient & PT reviewed AAROM ER technique as she reported this was the most difficult exercise. Patient demonstrated good form after tactile cuing. Goals are ongoing at this time. FOTO limitation: Normal response to modalities    Personal Factors and Comorbidities  Comorbidity 1;Time since onset of injury/illness/exacerbation;Profession    Examination-Activity Limitations  Carry;Reach Overhead;Lift;Caring for Others;Dressing;Hygiene/Grooming    Examination-Participation Restrictions  Driving    Stability/Clinical Decision Making  Stable/Uncomplicated    Clinical Decision Making  Low    Rehab Potential  Good    PT Frequency  3x / week    PT Duration  6 weeks    PT Treatment/Interventions  Cryotherapy;ADLs/Self Care Home Management;Electrical Stimulation;Iontophoresis 4mg /ml Dexamethasone;Moist Heat;Ultrasound;Therapeutic activities;Therapeutic exercise;Manual techniques;Dry needling;Passive range of motion;Vasopneumatic Device;Taping;Patient/family education    PT Next Visit Plan  PROM to right shoulder per MD protocol, AAROM Modalities PRN for pain relief    Consulted and Agree with Plan of Care  Patient       Patient will benefit  from skilled therapeutic intervention in order to improve the following deficits and impairments:  Pain, Impaired UE functional  use, Decreased strength, Decreased range of motion, Postural dysfunction, Decreased knowledge of precautions  Visit Diagnosis: 1. Muscle weakness (generalized)   2. Stiffness of right shoulder, not elsewhere classified   3. Acute pain of right shoulder        Problem List Patient Active Problem List   Diagnosis Date Noted  . GAD (generalized anxiety disorder) 11/15/2018  . Chronic right shoulder pain 08/05/2018  . Abdominal pain 07/10/2018  . Chronic right-sided low back pain with right-sided sciatica 10/10/2017  . Acute recurrent frontal sinusitis 07/03/2017  . Olecranon bursitis of right elbow 07/14/2016  . Migraine 07/14/2016  . Sexual dysfunction 07/14/2016  . History of CVA (cerebrovascular accident) 04/08/2016  . HTN (hypertension), benign 04/08/2016  . Depression 04/08/2016   Guss BundeKrystle Cloys Vera, PT, DPT 12/22/2018, 9:30 AM  [facility] [address] [place], [place], [address] Phone: [phone]   Fax:  [phone]  Name: Rosalyn GessLynn H Salts MRN: [id number] Date of Birth: Dec 18, 1955

## 2018-12-24 ENCOUNTER — Other Ambulatory Visit: Payer: Self-pay

## 2018-12-24 ENCOUNTER — Encounter: Payer: Self-pay | Admitting: Physical Therapy

## 2018-12-24 ENCOUNTER — Ambulatory Visit: Payer: 59 | Admitting: Physical Therapy

## 2018-12-24 DIAGNOSIS — M25511 Pain in right shoulder: Secondary | ICD-10-CM

## 2018-12-24 DIAGNOSIS — M6281 Muscle weakness (generalized): Secondary | ICD-10-CM

## 2018-12-24 DIAGNOSIS — M25611 Stiffness of right shoulder, not elsewhere classified: Secondary | ICD-10-CM

## 2018-12-24 NOTE — Therapy (Signed)
[facility] [address] [place], [place], [address] Phone: [phone]   Fax:  [phone]  Physical Therapy Treatment  Patient Details  Name: Tracy Jensen MRN: [id number] Date of Birth: Apr 07, 1956 Referring Provider (PT): Norlene CampbellPeter Whitfield, MD   Encounter Date: 12/24/2018  PT End of Session - 12/24/18 0810    Visit Number  11    Number of Visits  18    Date for PT Re-Evaluation  01/17/19    Authorization Type  FOTO; Progress note every 10th visit    PT Start Time  0813    PT Stop Time  0904    PT Time Calculation (min)  51 min    Activity Tolerance  Patient tolerated treatment well    Behavior During Therapy  [facility] for tasks assessed/performed       Past Medical History:  Diagnosis Date  . Hypertension   . Migraines   . Sciatica   . TIA (transient ischemic attack)     History reviewed. No pertinent surgical history.  There were no vitals filed for this visit.  Subjective Assessment - 12/24/18 0810    Subjective  COVID 19 screening performed on patient upon arrival. Patient reports that she may have done too much yesterday as she tried to fix supper last night. Reports waking up at 4 am in pain from fixing supper. Reports compliance with HEP.    Pertinent History  right RTC repair with SAD, DCR, and biceps tenodesis 11/11/2018, HTN, history of stroke    Limitations  Lifting;House hold activities;Other (comment)    Diagnostic tests  MRI: tear in right shoulder per patient    Patient Stated Goals  use arm without pain, take care of grandchild    Currently in Pain?  Yes    Pain Score  2     Pain Location  Shoulder    Pain Orientation  Right    Pain Descriptors / Indicators  Throbbing;Discomfort    Pain Type  Surgical pain    Pain Onset  1 to 4 weeks ago    Pain Frequency  Constant         [facility] PT Assessment - 12/24/18 0001      Assessment   Medical Diagnosis  Chronic R shoulder pain S/P scope    Referring Provider (PT)   Norlene CampbellPeter Whitfield, MD    Onset Date/Surgical Date  11/11/18    Hand Dominance  Right    Next MD Visit  01/12/2019    Prior Therapy  yes      Precautions   Precautions  Shoulder    Type of Shoulder Precautions  Right RTC repair, mini open, SAD, DCR, biceps tenodesis                   [facility] Adult PT Treatment/Exercise - 12/24/18 0001      Shoulder Exercises: Supine   Protraction  AAROM;Both;20 reps    External Rotation  AAROM;Right;10 reps    Flexion  AAROM;10 reps      Shoulder Exercises: Pulleys   Flexion  5 minutes      Shoulder Exercises: ROM/Strengthening   Ranger  UE ranger in flexion, CW, CCW, x3 mins each   in sitting     Modalities   Modalities  Vasopneumatic;Electrical Stimulation      Electrical Stimulation   Electrical Stimulation Location  R shoulder    Electrical Stimulation Action  IFC    Electrical Stimulation Parameters  80-150 hz x15 min  Electrical Stimulation Goals  Pain      Vasopneumatic   Number Minutes Vasopneumatic   15 minutes    Vasopnuematic Location   Shoulder    Vasopneumatic Pressure  Medium    Vasopneumatic Temperature   34      Manual Therapy   Manual Therapy  Passive ROM    Passive ROM  PROM to right shoulder into flexion and ER to improve range; oscillations to promote muscle relaxation                  PT Long Term Goals - 12/24/18 [id number]      PT LONG TERM GOAL #1   Title  Patient will be independent with HEP and its progression.    Time  6    Period  Weeks    Status  Achieved      PT LONG TERM GOAL #2   Title  Patient will demonstrate  145+ degrees of right shoulder flexion AROM to improve ability to perform functional tasks.    Time  6    Period  Weeks    Status  On-going      PT LONG TERM GOAL #3   Title  Patient will demonstrate 4/5 or greater right UE strength in all planes to improve ability to perform functional activities.    Time  6    Period  Weeks    Status  On-going      PT LONG TERM  GOAL #4   Title  Patient will report ability to perform ADLs and caregiving activities for her grandson with right shoulder pain less than or equal to 3/10.    Time  6    Period  Weeks    Status  On-going      PT LONG TERM GOAL #5   Title  Patient will demonstrate 55+ degrees or greater of right shoulder ER AROM to improve ability to don/doff apparel.    Time  6    Period  Weeks    Status  On-going            Plan - 12/24/18 0854    Clinical Impression Statement  Patient presented in clinic with reports of discomfort after fixing dinner last night and cleaning up. Patient reported tightness in R shoulder during therex and greater difficulty and discomfort with ER. Patient compliant with HEP per patient report. Patient educated to be careful wiht too much activity and to allow rest R shoulder. Patient compliant with icing R shoulder still as well. Firm end feels and smooth arc of motion noted during PROM of R shoulder in all directions. Patient required frequent oscillations to reduce muscle guarding and pain during PROM session. Normal modalities response noted following removal of the modalities.No sling donned upon arrival.    Personal Factors and Comorbidities  Comorbidity 1;Time since onset of injury/illness/exacerbation;Profession    Examination-Activity Limitations  Carry;Reach Overhead;Lift;Caring for Others;Dressing;Hygiene/Grooming    Examination-Participation Restrictions  Driving    Stability/Clinical Decision Making  Stable/Uncomplicated    Rehab Potential  Good    PT Frequency  3x / week    PT Duration  6 weeks    PT Treatment/Interventions  Cryotherapy;ADLs/Self Care Home Management;Electrical Stimulation;Iontophoresis 4mg /ml Dexamethasone;Moist Heat;Ultrasound;Therapeutic activities;Therapeutic exercise;Manual techniques;Dry needling;Passive range of motion;Vasopneumatic Device;Taping;Patient/family education    PT Next Visit Plan  PROM to right shoulder per MD protocol,  AAROM Modalities PRN for pain relief    PT Home Exercise Plan  see patient education section  Consulted and Agree with Plan of Care  Patient       Patient will benefit from skilled therapeutic intervention in order to improve the following deficits and impairments:  Pain, Impaired UE functional use, Decreased strength, Decreased range of motion, Postural dysfunction, Decreased knowledge of precautions  Visit Diagnosis: 1. Muscle weakness (generalized)   2. Stiffness of right shoulder, not elsewhere classified   3. Acute pain of right shoulder        Problem List Patient Active Problem List   Diagnosis Date Noted  . GAD (generalized anxiety disorder) 11/15/2018  . Chronic right shoulder pain 08/05/2018  . Abdominal pain 07/10/2018  . Chronic right-sided low back pain with right-sided sciatica 10/10/2017  . Acute recurrent frontal sinusitis 07/03/2017  . Olecranon bursitis of right elbow 07/14/2016  . Migraine 07/14/2016  . Sexual dysfunction 07/14/2016  . History of CVA (cerebrovascular accident) 04/08/2016  . HTN (hypertension), benign 04/08/2016  . Depression 04/08/2016    Standley Brooking, PTA 12/24/2018, 9:10 AM  [facility] [address] [place], [place], [address] Phone: [phone]   Fax:  [phone]  Name: Tracy Jensen MRN: [id number] Date of Birth: Jan 30, 1956

## 2018-12-27 ENCOUNTER — Ambulatory Visit: Payer: 59 | Admitting: Physical Therapy

## 2018-12-27 ENCOUNTER — Other Ambulatory Visit: Payer: Self-pay

## 2018-12-27 ENCOUNTER — Encounter: Payer: Self-pay | Admitting: Physical Therapy

## 2018-12-27 DIAGNOSIS — M25511 Pain in right shoulder: Secondary | ICD-10-CM

## 2018-12-27 DIAGNOSIS — M6281 Muscle weakness (generalized): Secondary | ICD-10-CM | POA: Diagnosis not present

## 2018-12-27 DIAGNOSIS — M25611 Stiffness of right shoulder, not elsewhere classified: Secondary | ICD-10-CM

## 2018-12-27 NOTE — Therapy (Signed)
[facility] [place], [place], [address] Phone: [phone]   Fax:  [phone]  Physical Therapy Treatment  Patient Details  Name: Tracy Jensen MRN: [id number] Date of Birth: [birth year] Referring Provider (PT): Joni Fears, MD   Encounter Date: 12/27/2018  PT End of Session - 12/27/18 0818    Visit Number  12    Number of Visits  18    Date for PT Re-Evaluation  01/17/19    Authorization Type  FOTO; Progress note every 10th visit    PT Start Time  0815    PT Stop Time  0905    PT Time Calculation (min)  50 min    Activity Tolerance  Patient tolerated treatment well    Behavior During Therapy  [facility] for tasks assessed/performed       Past Medical History:  Diagnosis Date  . Hypertension   . Migraines   . Sciatica   . TIA (transient ischemic attack)     History reviewed. No pertinent surgical history.  There were no vitals filed for this visit.  Subjective Assessment - 12/27/18 0817    Subjective  COVID 19 screening performed on patient upon arrival. Patient reports feeling stiff this morning and throughout the weekend. Patient later reported she was holding her grandson while sitting in the recliner last night.    Pertinent History  right RTC repair with SAD, DCR, and biceps tenodesis 11/11/2018, HTN, history of stroke    Limitations  Lifting;House hold activities;Other (comment)    Diagnostic tests  MRI: tear in right shoulder per patient    Patient Stated Goals  use arm without pain, take care of grandchild    Currently in Pain?  No/denies         [facility] PT Assessment - 12/27/18 0001      Assessment   Medical Diagnosis  Chronic R shoulder pain S/P scope    Referring Provider (PT)  Joni Fears, MD    Onset Date/Surgical Date  11/11/18    Hand Dominance  Right    Next MD Visit  01/12/2019    Prior Therapy  yes      Precautions   Precautions  Shoulder    Type of Shoulder Precautions  Right RTC  repair, mini open, SAD, DCR, biceps tenodesis                   [facility] Adult PT Treatment/Exercise - 12/27/18 0001      Shoulder Exercises: Supine   Protraction  AAROM;Both;20 reps    External Rotation  AAROM;Right;10 reps   10" hold   Flexion  AAROM;10 reps   10" hold     Shoulder Exercises: Pulleys   Flexion  5 minutes      Shoulder Exercises: ROM/Strengthening   Ranger  UE ranger in flexion, CW, CCW, x3 mins each      Modalities   Modalities  Vasopneumatic;Electrical Stimulation      Electrical Stimulation   Electrical Stimulation Location  R shoulder    Electrical Stimulation Action  IFC    Electrical Stimulation Parameters  80-150 hz x15 mins    Electrical Stimulation Goals  Pain      Vasopneumatic   Number Minutes Vasopneumatic   15 minutes    Vasopnuematic Location   Shoulder    Vasopneumatic Pressure  Medium    Vasopneumatic Temperature   34      Manual Therapy   Manual Therapy  Passive ROM  Passive ROM  PROM to right shoulder into flexion and ER to improve range; oscillations to promote muscle relaxation                  PT Long Term Goals - 12/24/18 [id number]      PT LONG TERM GOAL #1   Title  Patient will be independent with HEP and its progression.    Time  6    Period  Weeks    Status  Achieved      PT LONG TERM GOAL #2   Title  Patient will demonstrate  145+ degrees of right shoulder flexion AROM to improve ability to perform functional tasks.    Time  6    Period  Weeks    Status  On-going      PT LONG TERM GOAL #3   Title  Patient will demonstrate 4/5 or greater right UE strength in all planes to improve ability to perform functional activities.    Time  6    Period  Weeks    Status  On-going      PT LONG TERM GOAL #4   Title  Patient will report ability to perform ADLs and caregiving activities for her grandson with right shoulder pain less than or equal to 3/10.    Time  6    Period  Weeks    Status  On-going       PT LONG TERM GOAL #5   Title  Patient will demonstrate 55+ degrees or greater of right shoulder ER AROM to improve ability to don/doff apparel.    Time  6    Period  Weeks    Status  On-going            Plan - 12/27/18 0913    Clinical Impression Statement  Patient was able to tolerate treatment fairly well but did report an increase of pain with end range PROM. Patient was educated that she should be doing the pulleys more frequently than when she remembers. Patient also educated to be careful of her daily activities like cooking, carrying objects, and caring for her Einar Pheasantgradson as those activities may cause an increase of pain. Patient reported understanding. Normal response to modalities.    Personal Factors and Comorbidities  Comorbidity 1;Time since onset of injury/illness/exacerbation;Profession    Examination-Activity Limitations  Carry;Reach Overhead;Lift;Caring for Others;Dressing;Hygiene/Grooming    Examination-Participation Restrictions  Driving    Stability/Clinical Decision Making  Stable/Uncomplicated    Clinical Decision Making  Low    Rehab Potential  Good    PT Frequency  3x / week    PT Duration  6 weeks    PT Treatment/Interventions  Cryotherapy;ADLs/Self Care Home Management;Electrical Stimulation;Iontophoresis 4mg /ml Dexamethasone;Moist Heat;Ultrasound;Therapeutic activities;Therapeutic exercise;Manual techniques;Dry needling;Passive range of motion;Vasopneumatic Device;Taping;Patient/family education    PT Next Visit Plan  PROM to right shoulder per MD protocol, AAROM Modalities PRN for pain relief    PT Home Exercise Plan  see patient education section    Consulted and Agree with Plan of Care  Patient       Patient will benefit from skilled therapeutic intervention in order to improve the following deficits and impairments:  Pain, Impaired UE functional use, Decreased strength, Decreased range of motion, Postural dysfunction, Decreased knowledge of  precautions  Visit Diagnosis: 1. Muscle weakness (generalized)   2. Stiffness of right shoulder, not elsewhere classified   3. Acute pain of right shoulder        Problem List Patient Active Problem  List   Diagnosis Date Noted  . GAD (generalized anxiety disorder) 11/15/2018  . Chronic right shoulder pain 08/05/2018  . Abdominal pain 07/10/2018  . Chronic right-sided low back pain with right-sided sciatica 10/10/2017  . Acute recurrent frontal sinusitis 07/03/2017  . Olecranon bursitis of right elbow 07/14/2016  . Migraine 07/14/2016  . Sexual dysfunction 07/14/2016  . History of CVA (cerebrovascular accident) 04/08/2016  . HTN (hypertension), benign 04/08/2016  . Depression 04/08/2016    Guss BundeKrystle Keyoni Lapinski, PT, DPT 12/27/2018, 10:01 AM  [facility] [address] [place], [place], [address] Phone: [phone]   Fax:  [phone]  Name: Tracy Jensen MRN: [id number] Date of Birth: 1956/01/08

## 2018-12-29 ENCOUNTER — Other Ambulatory Visit: Payer: Self-pay

## 2018-12-29 ENCOUNTER — Encounter: Payer: Self-pay | Admitting: Physical Therapy

## 2018-12-29 ENCOUNTER — Ambulatory Visit: Payer: 59 | Admitting: Physical Therapy

## 2018-12-29 DIAGNOSIS — M6281 Muscle weakness (generalized): Secondary | ICD-10-CM | POA: Diagnosis not present

## 2018-12-29 DIAGNOSIS — M25511 Pain in right shoulder: Secondary | ICD-10-CM

## 2018-12-29 DIAGNOSIS — M25611 Stiffness of right shoulder, not elsewhere classified: Secondary | ICD-10-CM

## 2018-12-29 NOTE — Therapy (Signed)
[facility] [address] [place], [place], [address] Phone: [phone]   Fax:  [phone]  Physical Therapy Treatment  Patient Details  Name: Tracy Jensen MRN: [id number] Date of Birth: 1955-07-06 Referring Provider (PT): Norlene CampbellPeter Whitfield, MD   Encounter Date: 12/29/2018  PT End of Session - 12/29/18 0819    Visit Number  13    Number of Visits  18    Date for PT Re-Evaluation  01/17/19    Authorization Type  FOTO; Progress note every 10th visit    PT Start Time  0815    PT Stop Time  0903    PT Time Calculation (min)  48 min    Activity Tolerance  Patient tolerated treatment well       Past Medical History:  Diagnosis Date  . Hypertension   . Migraines   . Sciatica   . TIA (transient ischemic attack)     History reviewed. No pertinent surgical history.  There were no vitals filed for this visit.  Subjective Assessment - 12/29/18 0813    Subjective  COVID 19 screening performed on patient upon arrival. Patient reported feeling sore in the anterior aspect of her right shoulder after last visit.    Pertinent History  right RTC repair with SAD, DCR, and biceps tenodesis 11/11/2018, HTN, history of stroke    Limitations  Lifting;House hold activities;Other (comment)    Diagnostic tests  MRI: tear in right shoulder per patient    Patient Stated Goals  use arm without pain, take care of grandchild    Currently in Pain?  Yes    Pain Score  3     Pain Location  Shoulder    Pain Orientation  Right    Pain Descriptors / Indicators  Sore    Pain Type  Surgical pain    Pain Onset  1 to 4 weeks ago    Pain Frequency  Constant         [facility] PT Assessment - 12/29/18 0001      Assessment   Medical Diagnosis  Chronic R shoulder pain S/P scope    Referring Provider (PT)  Norlene CampbellPeter Whitfield, MD    Onset Date/Surgical Date  11/11/18    Hand Dominance  Right    Next MD Visit  01/12/2019    Prior Therapy  yes      Precautions   Precautions  Shoulder    Type of Shoulder Precautions  Right RTC repair, mini open, SAD, DCR, biceps tenodesis                   [facility] Adult PT Treatment/Exercise - 12/29/18 0001      Shoulder Exercises: Supine   Protraction  AAROM;Both;20 reps    Flexion  AAROM;15 reps   5" hold   ABduction  AAROM;Right;15 reps   5" hold     Shoulder Exercises: Seated   Retraction  AROM;Both;10 reps      Shoulder Exercises: Pulleys   Flexion  5 minutes      Shoulder Exercises: Therapy Newman PiesBall   Other Therapy Ball Exercises  theraball ABC on table x1 capital letters      Shoulder Exercises: ROM/Strengthening   Ranger  UE ranger in flexion, CW, CCW, x3 mins each      Modalities   Modalities  Vasopneumatic;Electrical Stimulation      Electrical Stimulation   Electrical Stimulation Location  R shoulder    Electrical Stimulation Action  IFC  Electrical Stimulation Parameters  80-150 hz x 10 mins    Electrical Stimulation Goals  Pain      Vasopneumatic   Number Minutes Vasopneumatic   10 minutes    Vasopnuematic Location   Shoulder    Vasopneumatic Pressure  Medium    Vasopneumatic Temperature   34      Manual Therapy   Manual Therapy  Passive ROM    Passive ROM  PROM to right shoulder into flexion, ABD, and ER to improve range; oscillations to promote muscle relaxation                  PT Long Term Goals - 12/24/18 [id number]      PT LONG TERM GOAL #1   Title  Patient will be independent with HEP and its progression.    Time  6    Period  Weeks    Status  Achieved      PT LONG TERM GOAL #2   Title  Patient will demonstrate  145+ degrees of right shoulder flexion AROM to improve ability to perform functional tasks.    Time  6    Period  Weeks    Status  On-going      PT LONG TERM GOAL #3   Title  Patient will demonstrate 4/5 or greater right UE strength in all planes to improve ability to perform functional activities.    Time  6    Period  Weeks    Status   On-going      PT LONG TERM GOAL #4   Title  Patient will report ability to perform ADLs and caregiving activities for her grandson with right shoulder pain less than or equal to 3/10.    Time  6    Period  Weeks    Status  On-going      PT LONG TERM GOAL #5   Title  Patient will demonstrate 55+ degrees or greater of right shoulder ER AROM to improve ability to don/doff apparel.    Time  6    Period  Weeks    Status  On-going            Plan - 12/29/18 [id number]    Clinical Impression Statement  Patient was able to tolerate treatment well despite soreness. Patient stated she has been performing AROM shoulder flexion without assistance as HEP. Patient educated again about importance of AAROM following protocol in therapy and at home. She mentioned she did not know but last visit she was able to recite AAROM flexion HEP provided by her doctor at her last follow up visit.    Personal Factors and Comorbidities  Comorbidity 1;Time since onset of injury/illness/exacerbation;Profession    Examination-Activity Limitations  Carry;Reach Overhead;Lift;Caring for Others;Dressing;Hygiene/Grooming    Examination-Participation Restrictions  Driving    Stability/Clinical Decision Making  Stable/Uncomplicated    Clinical Decision Making  Low    Rehab Potential  Good    PT Frequency  3x / week    PT Duration  6 weeks    PT Treatment/Interventions  Cryotherapy;ADLs/Self Care Home Management;Electrical Stimulation;Iontophoresis 4mg /ml Dexamethasone;Moist Heat;Ultrasound;Therapeutic activities;Therapeutic exercise;Manual techniques;Dry needling;Passive range of motion;Vasopneumatic Device;Taping;Patient/family education    PT Next Visit Plan  PROM to right shoulder per MD protocol, AAROM Modalities PRN for pain relief 7 weeks post op 12/30/2018    Consulted and Agree with Plan of Care  Patient       Patient will benefit from skilled therapeutic intervention in order to improve the following  deficits and  impairments:  Pain, Impaired UE functional use, Decreased strength, Decreased range of motion, Postural dysfunction, Decreased knowledge of precautions  Visit Diagnosis: 1. Muscle weakness (generalized)   2. Stiffness of right shoulder, not elsewhere classified   3. Acute pain of right shoulder        Problem List Patient Active Problem List   Diagnosis Date Noted  . GAD (generalized anxiety disorder) 11/15/2018  . Chronic right shoulder pain 08/05/2018  . Abdominal pain 07/10/2018  . Chronic right-sided low back pain with right-sided sciatica 10/10/2017  . Acute recurrent frontal sinusitis 07/03/2017  . Olecranon bursitis of right elbow 07/14/2016  . Migraine 07/14/2016  . Sexual dysfunction 07/14/2016  . History of CVA (cerebrovascular accident) 04/08/2016  . HTN (hypertension), benign 04/08/2016  . Depression 04/08/2016   [name], PT, DPT 12/29/2018, 9:08 AM  [facility] [address] [place], [place], [address] Phone: [phone]   Fax:  [phone]  Name: Tracy Jensen MRN: [id number] Date of Birth: Feb 01, 1956

## 2018-12-31 ENCOUNTER — Encounter: Payer: Self-pay | Admitting: Physical Therapy

## 2018-12-31 ENCOUNTER — Ambulatory Visit: Payer: 59 | Admitting: Physical Therapy

## 2018-12-31 ENCOUNTER — Other Ambulatory Visit: Payer: Self-pay

## 2018-12-31 DIAGNOSIS — M25511 Pain in right shoulder: Secondary | ICD-10-CM

## 2018-12-31 DIAGNOSIS — M25611 Stiffness of right shoulder, not elsewhere classified: Secondary | ICD-10-CM

## 2018-12-31 DIAGNOSIS — M6281 Muscle weakness (generalized): Secondary | ICD-10-CM | POA: Diagnosis not present

## 2018-12-31 NOTE — Therapy (Signed)
[facility] [place], [place], [address] Phone: [phone]   Fax:  [phone]  Physical Therapy Treatment  Patient Details  Name: Tracy Jensen MRN: [id number] Date of Birth: 1955-10-14 Referring Provider (PT): Joni Fears, MD   Encounter Date: 12/31/2018  PT End of Session - 12/31/18 0814    Visit Number  14    Number of Visits  18    Date for PT Re-Evaluation  01/17/19    Authorization Type  FOTO; Progress note every 10th visit    PT Start Time  0815    PT Stop Time  0904    PT Time Calculation (min)  49 min    Activity Tolerance  Patient tolerated treatment well    Behavior During Therapy  [facility] for tasks assessed/performed       Past Medical History:  Diagnosis Date  . Hypertension   . Migraines   . Sciatica   . TIA (transient ischemic attack)     History reviewed. No pertinent surgical history.  There were no vitals filed for this visit.  Subjective Assessment - 12/31/18 0820    Subjective  COVID 19 screening performed on patient upon arrival. Patient arrives with ongoing tightness in right shoulder but not pain    Pertinent History  right RTC repair with SAD, DCR, and biceps tenodesis 11/11/2018, HTN, history of stroke    Limitations  Lifting;House hold activities;Other (comment)    Diagnostic tests  MRI: tear in right shoulder per patient    Patient Stated Goals  use arm without pain, take care of grandchild         [facility] PT Assessment - 12/31/18 0001      Assessment   Medical Diagnosis  Chronic R shoulder pain S/P scope    Referring Provider (PT)  Joni Fears, MD    Onset Date/Surgical Date  11/11/18    Hand Dominance  Right    Next MD Visit  01/12/2019    Prior Therapy  yes      Precautions   Precautions  Shoulder    Type of Shoulder Precautions  Right RTC repair, mini open, SAD, DCR, biceps tenodesis                   [facility] Adult PT Treatment/Exercise - 12/31/18 0001       Shoulder Exercises: Supine   Protraction  AAROM;Both;20 reps    External Rotation  AAROM;Right;15 reps   5" hold   Flexion  AAROM;15 reps   5" hold   ABduction  AAROM;Right;15 reps   5" hold     Shoulder Exercises: Pulleys   Flexion  5 minutes      Shoulder Exercises: Therapy Ball   Other Therapy Ball Exercises  theraball ABC on table x1 lower case letters      Shoulder Exercises: ROM/Strengthening   Ranger  UE ranger in flexion, CW, CCW, x3 mins each      Modalities   Modalities  Vasopneumatic;Electrical Stimulation      Electrical Stimulation   Electrical Stimulation Location  R shoulder    Electrical Stimulation Action  IFC    Electrical Stimulation Parameters  80-150 hz x10 mins    Electrical Stimulation Goals  Pain      Vasopneumatic   Number Minutes Vasopneumatic   10 minutes    Vasopnuematic Location   Shoulder    Vasopneumatic Pressure  Medium    Vasopneumatic Temperature   34  Manual Therapy   Manual Therapy  Passive ROM    Passive ROM  PROM to right shoulder into flexion, ABD, and ER to improve range; oscillations to promote muscle relaxation                  PT Long Term Goals - 12/24/18 [id number]      PT LONG TERM GOAL #1   Title  Patient will be independent with HEP and its progression.    Time  6    Period  Weeks    Status  Achieved      PT LONG TERM GOAL #2   Title  Patient will demonstrate  145+ degrees of right shoulder flexion AROM to improve ability to perform functional tasks.    Time  6    Period  Weeks    Status  On-going      PT LONG TERM GOAL #3   Title  Patient will demonstrate 4/5 or greater right UE strength in all planes to improve ability to perform functional activities.    Time  6    Period  Weeks    Status  On-going      PT LONG TERM GOAL #4   Title  Patient will report ability to perform ADLs and caregiving activities for her grandson with right shoulder pain less than or equal to 3/10.    Time  6    Period   Weeks    Status  On-going      PT LONG TERM GOAL #5   Title  Patient will demonstrate 55+ degrees or greater of right shoulder ER AROM to improve ability to don/doff apparel.    Time  6    Period  Weeks    Status  On-going            Plan - 12/31/18 0859    Clinical Impression Statement  Patient was able to tolerate treatment well but reported pulleys and seated UE ranger were a little more difficult today. [facility] but paitent was reminded to use the opposite arm to assist in ROM. Normal response to modalities upon removal.    Personal Factors and Comorbidities  Comorbidity 1;Time since onset of injury/illness/exacerbation;Profession    Examination-Activity Limitations  Carry;Reach Overhead;Lift;Caring for Others;Dressing;Hygiene/Grooming    Examination-Participation Restrictions  Driving    Stability/Clinical Decision Making  Stable/Uncomplicated    Clinical Decision Making  Low    Rehab Potential  Good    PT Frequency  3x / week    PT Duration  6 weeks    PT Treatment/Interventions  Cryotherapy;ADLs/Self Care Home Management;Electrical Stimulation;Iontophoresis 4mg /ml Dexamethasone;Moist Heat;Ultrasound;Therapeutic activities;Therapeutic exercise;Manual techniques;Dry needling;Passive range of motion;Vasopneumatic Device;Taping;Patient/family education    PT Next Visit Plan  PROM to right shoulder per MD protocol, AAROM Modalities PRN for pain relief 7 weeks post op 12/30/2018    Consulted and Agree with Plan of Care  Patient       Patient will benefit from skilled therapeutic intervention in order to improve the following deficits and impairments:  Pain, Impaired UE functional use, Decreased strength, Decreased range of motion, Postural dysfunction, Decreased knowledge of precautions  Visit Diagnosis: 1. Muscle weakness (generalized)   2. Stiffness of right shoulder, not elsewhere classified   3. Acute pain of right shoulder        Problem  List Patient Active Problem List   Diagnosis Date Noted  . GAD (generalized anxiety disorder) 11/15/2018  . Chronic right shoulder pain 08/05/2018  .  Abdominal pain 07/10/2018  . Chronic right-sided low back pain with right-sided sciatica 10/10/2017  . Acute recurrent frontal sinusitis 07/03/2017  . Olecranon bursitis of right elbow 07/14/2016  . Migraine 07/14/2016  . Sexual dysfunction 07/14/2016  . History of CVA (cerebrovascular accident) 04/08/2016  . HTN (hypertension), benign 04/08/2016  . Depression 04/08/2016   Guss BundeKrystle Caleigh Rabelo, PT, DPT 12/31/2018, 9:45 AM  [facility] [address] [place], [place], [address] Phone: [phone]   Fax:  [phone]  Name: Tracy Jensen MRN: [id number] Date of Birth: 21-Sep-1955

## 2019-01-03 ENCOUNTER — Other Ambulatory Visit: Payer: Self-pay

## 2019-01-03 ENCOUNTER — Encounter: Payer: Self-pay | Admitting: Physical Therapy

## 2019-01-03 ENCOUNTER — Ambulatory Visit: Payer: 59 | Admitting: Physical Therapy

## 2019-01-03 DIAGNOSIS — M25611 Stiffness of right shoulder, not elsewhere classified: Secondary | ICD-10-CM

## 2019-01-03 DIAGNOSIS — M6281 Muscle weakness (generalized): Secondary | ICD-10-CM | POA: Diagnosis not present

## 2019-01-03 DIAGNOSIS — M25511 Pain in right shoulder: Secondary | ICD-10-CM

## 2019-01-03 NOTE — Therapy (Signed)
[facility] [place], [place], [address] Phone: [phone]   Fax:  [phone]  Physical Therapy Treatment  Patient Details  Name: Tracy Jensen MRN: [id number] Date of Birth: 04-07-1956 Referring Provider (PT): Joni Fears, MD   Encounter Date: 01/03/2019  PT End of Session - 01/03/19 0828    Visit Number  15    Number of Visits  18    Date for PT Re-Evaluation  01/17/19    Authorization Type  FOTO; Progress note every 10th visit    PT Start Time  0813    PT Stop Time  0903    PT Time Calculation (min)  50 min    Activity Tolerance  Patient tolerated treatment well    Behavior During Therapy  [facility] for tasks assessed/performed       Past Medical History:  Diagnosis Date  . Hypertension   . Migraines   . Sciatica   . TIA (transient ischemic attack)     History reviewed. No pertinent surgical history.  There were no vitals filed for this visit.  Subjective Assessment - 01/03/19 0827    Subjective  COVID 19 screening performed on patient upon arrival. Patient reports feeling "just tight and stiff"    Pertinent History  right RTC repair with SAD, DCR, and biceps tenodesis 11/11/2018, HTN, history of stroke    Limitations  Lifting;House hold activities;Other (comment)    Diagnostic tests  MRI: tear in right shoulder per patient    Patient Stated Goals  use arm without pain, take care of grandchild    Currently in Pain?  No/denies         [facility] PT Assessment - 01/03/19 0001      Assessment   Medical Diagnosis  Chronic R shoulder pain S/P scope    Referring Provider (PT)  Joni Fears, MD    Onset Date/Surgical Date  11/11/18    Hand Dominance  Right    Next MD Visit  01/12/2019    Prior Therapy  yes      Precautions   Precautions  Shoulder    Type of Shoulder Precautions  Right RTC repair, mini open, SAD, DCR, biceps tenodesis                   [facility] Adult PT Treatment/Exercise -  01/03/19 0001      Shoulder Exercises: Supine   Protraction  AAROM;Both;20 reps;10 reps    External Rotation  AAROM;Right;20 reps    Flexion  AAROM;20 reps      Shoulder Exercises: Pulleys   Flexion  5 minutes      Shoulder Exercises: ROM/Strengthening   Ranger  UE ranger in flexion, CW, CCW, x3 mins each      Modalities   Modalities  Vasopneumatic;Electrical Stimulation      Electrical Stimulation   Electrical Stimulation Location  R shoulder    Electrical Stimulation Action  IFC    Electrical Stimulation Parameters  80-150 hz x10 mins    Electrical Stimulation Goals  Pain      Vasopneumatic   Number Minutes Vasopneumatic   10 minutes    Vasopnuematic Location   Shoulder    Vasopneumatic Pressure  Medium    Vasopneumatic Temperature   34      Manual Therapy   Manual Therapy  Passive ROM    Passive ROM  PROM to right shoulder into flexion, ABD, and ER to improve range; oscillations to promote muscle relaxation  PT Long Term Goals - 12/24/18 [id number]      PT LONG TERM GOAL #1   Title  Patient will be independent with HEP and its progression.    Time  6    Period  Weeks    Status  Achieved      PT LONG TERM GOAL #2   Title  Patient will demonstrate  145+ degrees of right shoulder flexion AROM to improve ability to perform functional tasks.    Time  6    Period  Weeks    Status  On-going      PT LONG TERM GOAL #3   Title  Patient will demonstrate 4/5 or greater right UE strength in all planes to improve ability to perform functional activities.    Time  6    Period  Weeks    Status  On-going      PT LONG TERM GOAL #4   Title  Patient will report ability to perform ADLs and caregiving activities for her grandson with right shoulder pain less than or equal to 3/10.    Time  6    Period  Weeks    Status  On-going      PT LONG TERM GOAL #5   Title  Patient will demonstrate 55+ degrees or greater of right shoulder ER AROM to improve ability  to don/doff apparel.    Time  6    Period  Weeks    Status  On-going            Plan - 01/03/19 [id number]    Clinical Impression Statement  Patient was able to tolerate treatment but required cuing for form and technique for supine exercises. During PROM, patient noted with increased guarding at end ranges and patient later stated she went to [employer] and went shopping and felt pushing the cart caused more discomfort. Patient again educated on importance of protocol and not over doing her home activities. Normal response to modalities upon removal.    Personal Factors and Comorbidities  Comorbidity 1;Time since onset of injury/illness/exacerbation;Profession    Examination-Activity Limitations  Carry;Reach Overhead;Lift;Caring for Others;Dressing;Hygiene/Grooming    Examination-Participation Restrictions  Driving    Stability/Clinical Decision Making  Stable/Uncomplicated    Clinical Decision Making  Low    Rehab Potential  Good    PT Frequency  3x / week    PT Duration  6 weeks    PT Treatment/Interventions  Cryotherapy;ADLs/Self Care Home Management;Electrical Stimulation;Iontophoresis 4mg /ml Dexamethasone;Moist Heat;Ultrasound;Therapeutic activities;Therapeutic exercise;Manual techniques;Dry needling;Passive range of motion;Vasopneumatic Device;Taping;Patient/family education    PT Next Visit Plan  PROM to right shoulder per MD protocol, AAROM Modalities PRN for pain relief 7 weeks post op 12/30/2018    PT Home Exercise Plan  see patient education section    Consulted and Agree with Plan of Care  Patient       Patient will benefit from skilled therapeutic intervention in order to improve the following deficits and impairments:  Pain, Impaired UE functional use, Decreased strength, Decreased range of motion, Postural dysfunction, Decreased knowledge of precautions  Visit Diagnosis: 1. Muscle weakness (generalized)   2. Stiffness of right shoulder, not elsewhere classified   3. Acute pain  of right shoulder        Problem List Patient Active Problem List   Diagnosis Date Noted  . GAD (generalized anxiety disorder) 11/15/2018  . Chronic right shoulder pain 08/05/2018  . Abdominal pain 07/10/2018  . Chronic right-sided low back pain with right-sided sciatica 10/10/2017  .  Acute recurrent frontal sinusitis 07/03/2017  . Olecranon bursitis of right elbow 07/14/2016  . Migraine 07/14/2016  . Sexual dysfunction 07/14/2016  . History of CVA (cerebrovascular accident) 04/08/2016  . HTN (hypertension), benign 04/08/2016  . Depression 04/08/2016   Guss BundeKrystle Zulema [place]aski, PT, DPT 01/03/2019, 10:37 AM  [facility] [address] [place], [place], [address] Phone: [phone]   Fax:  [phone]  Name: Tracy Jensen MRN: [id number] Date of Birth: [birth year]

## 2019-01-05 ENCOUNTER — Encounter: Payer: Self-pay | Admitting: Physical Therapy

## 2019-01-05 ENCOUNTER — Ambulatory Visit: Payer: 59 | Admitting: Physical Therapy

## 2019-01-05 ENCOUNTER — Other Ambulatory Visit: Payer: Self-pay

## 2019-01-05 DIAGNOSIS — M6281 Muscle weakness (generalized): Secondary | ICD-10-CM | POA: Diagnosis not present

## 2019-01-05 DIAGNOSIS — M25511 Pain in right shoulder: Secondary | ICD-10-CM

## 2019-01-05 DIAGNOSIS — M25611 Stiffness of right shoulder, not elsewhere classified: Secondary | ICD-10-CM

## 2019-01-05 NOTE — Therapy (Signed)
[facility] [address] [place], [place], [address] Phone: [phone]   Fax:  [phone]  Physical Therapy Treatment  Patient Details  Name: Tracy Jensen MRN: [id number] Date of Birth: [birth year] Referring Provider (PT): Norlene CampbellPeter Whitfield, MD   Encounter Date: 01/05/2019  PT End of Session - 01/05/19 0825    Visit Number  16    Number of Visits  18    Date for PT Re-Evaluation  01/17/19    Authorization Type  FOTO; Progress note every 10th visit    PT Start Time  0815    PT Stop Time  0904    PT Time Calculation (min)  49 min    Activity Tolerance  Patient tolerated treatment well    Behavior During Therapy  [facility] for tasks assessed/performed       Past Medical History:  Diagnosis Date  . Hypertension   . Migraines   . Sciatica   . TIA (transient ischemic attack)     History reviewed. No pertinent surgical history.  There were no vitals filed for this visit.  Subjective Assessment - 01/05/19 0818    Subjective  COVID 19 screening performed on patient upon arrival. Patient reports no new compaints    Pertinent History  right RTC repair with SAD, DCR, and biceps tenodesis 11/11/2018, HTN, history of stroke    Limitations  Lifting;House hold activities;Other (comment)    Diagnostic tests  MRI: tear in right shoulder per patient    Patient Stated Goals  use arm without pain, take care of grandchild    Currently in Pain?  No/denies         [facility] PT Assessment - 01/05/19 0001      Assessment   Medical Diagnosis  Chronic R shoulder pain S/P scope    Referring Provider (PT)  Norlene CampbellPeter Whitfield, MD    Onset Date/Surgical Date  11/11/18    Hand Dominance  Right    Next MD Visit  01/12/2019    Prior Therapy  yes      Precautions   Precautions  Shoulder    Type of Shoulder Precautions  Right RTC repair, mini open, SAD, DCR, biceps tenodesis                   [facility] Adult PT Treatment/Exercise - 01/05/19 0001      Shoulder Exercises: Pulleys   Flexion  5 minutes      Shoulder Exercises: Therapy Ball   Other Therapy Ball Exercises  theraball ABC on table x1 lower case letters x1 captial letters      Shoulder Exercises: ROM/Strengthening   Ranger  UE ranger in flexion, CW, CCW, x3 mins each    Other ROM/Strengthening Exercises  wall ladder x10 level 20      Modalities   Modalities  Vasopneumatic;Electrical Stimulation      Electrical Stimulation   Electrical Stimulation Location  R shoulder    Electrical Stimulation Action  IFC    Electrical Stimulation Parameters  80-150 hz x10 mins    Electrical Stimulation Goals  Pain      Vasopneumatic   Number Minutes Vasopneumatic   10 minutes    Vasopnuematic Location   Shoulder    Vasopneumatic Pressure  Medium    Vasopneumatic Temperature   34      Manual Therapy   Manual Therapy  Passive ROM    Passive ROM  PROM to right shoulder into flexion, ABD, and ER to improve range; oscillations  to promote muscle relaxation                  PT Long Term Goals - 12/24/18 7829      PT LONG TERM GOAL #1   Title  Patient will be independent with HEP and its progression.    Time  6    Period  Weeks    Status  Achieved      PT LONG TERM GOAL #2   Title  Patient will demonstrate  145+ degrees of right shoulder flexion AROM to improve ability to perform functional tasks.    Time  6    Period  Weeks    Status  On-going      PT LONG TERM GOAL #3   Title  Patient will demonstrate 4/5 or greater right UE strength in all planes to improve ability to perform functional activities.    Time  6    Period  Weeks    Status  On-going      PT LONG TERM GOAL #4   Title  Patient will report ability to perform ADLs and caregiving activities for her grandson with right shoulder pain less than or equal to 3/10.    Time  6    Period  Weeks    Status  On-going      PT LONG TERM GOAL #5   Title  Patient will demonstrate 55+ degrees or greater of right  shoulder ER AROM to improve ability to don/doff apparel.    Time  6    Period  Weeks    Status  On-going            Plan - 01/05/19 5621    Clinical Impression Statement  Patient responded well to therapy despite ongoing tightness at end range with AAROM exercises. Patient was able to reach level 20 on wall ladder. Patient required intermittent cuing for form to prevent trunk compensations. Patient again reiterated to be careful with daily activities as she reported dishes are heavy and causes her discomfort when she lifts them. No adverse affects noted upon removal.    Personal Factors and Comorbidities  Comorbidity 1;Time since onset of injury/illness/exacerbation;Profession    Examination-Activity Limitations  Carry;Reach Overhead;Lift;Caring for Others;Dressing;Hygiene/Grooming    Examination-Participation Restrictions  Driving    Stability/Clinical Decision Making  Stable/Uncomplicated    Clinical Decision Making  Low    Rehab Potential  Good    PT Frequency  3x / week    PT Duration  6 weeks    PT Treatment/Interventions  Cryotherapy;ADLs/Self Care Home Management;Electrical Stimulation;Iontophoresis 4mg /ml Dexamethasone;Moist Heat;Ultrasound;Therapeutic activities;Therapeutic exercise;Manual techniques;Dry needling;Passive range of motion;Vasopneumatic Device;Taping;Patient/family education    PT Next Visit Plan  PROM to right shoulder per MD protocol, AAROM Modalities PRN for pain relief 8 weeks post op 01/06/2019    PT Home Exercise Plan  see patient education section    Consulted and Agree with Plan of Care  Patient       Patient will benefit from skilled therapeutic intervention in order to improve the following deficits and impairments:  Pain, Impaired UE functional use, Decreased strength, Decreased range of motion, Postural dysfunction, Decreased knowledge of precautions  Visit Diagnosis: 1. Muscle weakness (generalized)   2. Stiffness of right shoulder, not elsewhere  classified   3. Acute pain of right shoulder        Problem List Patient Active Problem List   Diagnosis Date Noted  . GAD (generalized anxiety disorder) 11/15/2018  . Chronic right  shoulder pain 08/05/2018  . Abdominal pain 07/10/2018  . Chronic right-sided low back pain with right-sided sciatica 10/10/2017  . Acute recurrent frontal sinusitis 07/03/2017  . Olecranon bursitis of right elbow 07/14/2016  . Migraine 07/14/2016  . Sexual dysfunction 07/14/2016  . History of CVA (cerebrovascular accident) 04/08/2016  . HTN (hypertension), benign 04/08/2016  . Depression 04/08/2016   Guss BundeKrystle Tineka Uriegas, PT, DPT 01/05/2019, 10:32 AM  [facility] [address] [place], [place], [address] Phone: [phone]   Fax:  [phone]  Name: Tracy Jensen MRN: [id number] Date of Birth: [birth year]

## 2019-01-07 ENCOUNTER — Encounter: Payer: Self-pay | Admitting: Physical Therapy

## 2019-01-07 ENCOUNTER — Ambulatory Visit: Payer: 59 | Admitting: Physical Therapy

## 2019-01-07 ENCOUNTER — Other Ambulatory Visit: Payer: Self-pay

## 2019-01-07 DIAGNOSIS — M6281 Muscle weakness (generalized): Secondary | ICD-10-CM

## 2019-01-07 DIAGNOSIS — M25511 Pain in right shoulder: Secondary | ICD-10-CM

## 2019-01-07 DIAGNOSIS — M25611 Stiffness of right shoulder, not elsewhere classified: Secondary | ICD-10-CM

## 2019-01-07 NOTE — Therapy (Signed)
[facility] [address] [place], [place], [address] Phone: [phone]   Fax:  [phone]  Physical Therapy Treatment  Patient Details  Name: Tracy Jensen MRN: [id number] Date of Birth: 1956-02-22 Referring Provider (PT): Norlene CampbellPeter Whitfield, MD   Encounter Date: 01/07/2019  PT End of Session - 01/07/19 0820    Visit Number  17    Number of Visits  18    Date for PT Re-Evaluation  01/17/19    Authorization Type  FOTO; Progress note every 10th visit    PT Start Time  0818    PT Stop Time  0906    PT Time Calculation (min)  48 min    Activity Tolerance  Patient tolerated treatment well    Behavior During Therapy  [facility] for tasks assessed/performed       Past Medical History:  Diagnosis Date  . Hypertension   . Migraines   . Sciatica   . TIA (transient ischemic attack)     History reviewed. No pertinent surgical history.  There were no vitals filed for this visit.  Subjective Assessment - 01/07/19 0818    Subjective  COVID 19 screening performed on patient upon arrival. Patient reports no new compaints    Pertinent History  right RTC repair with SAD, DCR, and biceps tenodesis 11/11/2018, HTN, history of stroke    Limitations  Lifting;House hold activities;Other (comment)    Diagnostic tests  MRI: tear in right shoulder per patient    Patient Stated Goals  use arm without pain, take care of grandchild    Currently in Pain?  Yes    Pain Score  4     Pain Location  Shoulder    Pain Orientation  Right    Pain Descriptors / Indicators  Sore;Other (Comment)   STIFFNESS   Pain Type  Surgical pain    Pain Onset  1 to 4 weeks ago    Pain Frequency  Constant         [facility] PT Assessment - 01/07/19 0001      Assessment   Medical Diagnosis  Chronic R shoulder pain S/P scope    Referring Provider (PT)  Norlene CampbellPeter Whitfield, MD    Onset Date/Surgical Date  11/11/18    Hand Dominance  Right    Next MD Visit  01/12/2019    Prior Therapy   yes      Precautions   Precautions  Shoulder    Type of Shoulder Precautions  Right RTC repair, mini open, SAD, DCR, biceps tenodesis      ROM / Strength   AROM / PROM / Strength  AROM      AROM   Overall AROM   Deficits    AROM Assessment Site  Shoulder    Right/Left Shoulder  Right    Right Shoulder Flexion  140 Degrees    Right Shoulder Internal Rotation  70 Degrees    Right Shoulder External Rotation  57 Degrees                   [facility] Adult PT Treatment/Exercise - 01/07/19 0001      Shoulder Exercises: Supine   Protraction  AAROM;Both;15 reps    External Rotation  AAROM;Right;15 reps    Flexion  AAROM;15 reps      Shoulder Exercises: Pulleys   Flexion  5 minutes      Shoulder Exercises: Therapy Ball   Other Therapy Ball Exercises  theraball ABC on table x1  lower case letters x1 captial letters      Shoulder Exercises: ROM/Strengthening   Ranger  UE ranger in flexion, CW, CCW, x20 reps   standing   Other ROM/Strengthening Exercises  wall ladder x10 level 20      Modalities   Modalities  Vasopneumatic;Electrical Stimulation      Electrical Stimulation   Electrical Stimulation Location  R shoulder    Electrical Stimulation Action  IFC    Electrical Stimulation Parameters  80-150 hz x15 min    Electrical Stimulation Goals  Pain      Vasopneumatic   Number Minutes Vasopneumatic   15 minutes    Vasopnuematic Location   Shoulder    Vasopneumatic Pressure  Low    Vasopneumatic Temperature   34      Manual Therapy   Manual Therapy  Passive ROM    Passive ROM  PROM to right shoulder into flexion, IR and ER to improve range                  PT Long Term Goals - 12/24/18 9563      PT LONG TERM GOAL #1   Title  Patient will be independent with HEP and its progression.    Time  6    Period  Weeks    Status  Achieved      PT LONG TERM GOAL #2   Title  Patient will demonstrate  145+ degrees of right shoulder flexion AROM to improve ability  to perform functional tasks.    Time  6    Period  Weeks    Status  On-going      PT LONG TERM GOAL #3   Title  Patient will demonstrate 4/5 or greater right UE strength in all planes to improve ability to perform functional activities.    Time  6    Period  Weeks    Status  On-going      PT LONG TERM GOAL #4   Title  Patient will report ability to perform ADLs and caregiving activities for her grandson with right shoulder pain less than or equal to 3/10.    Time  6    Period  Weeks    Status  On-going      PT LONG TERM GOAL #5   Title  Patient will demonstrate 55+ degrees or greater of right shoulder ER AROM to improve ability to don/doff apparel.    Time  6    Period  Weeks    Status  On-going            Plan - 01/07/19 8756    Clinical Impression Statement  Patient continues to report normal stiffness and soreness upon arrival. Patient progressed to more AAROM exercises in standing for progression into more antigravity with no complaints. Patient reports greatest discomfort being surrounding the proximal shoulder around bicep and deltoids region. Less discomfort with end range ER per patient report. Firm end feels and smooth arc of motion noted during PROM of R shoulder. All measurements completed today in supine. Normal modalities response noted following removal of the modalities.    Personal Factors and Comorbidities  Comorbidity 1;Time since onset of injury/illness/exacerbation;Profession    Examination-Activity Limitations  Carry;Reach Overhead;Lift;Caring for Others;Dressing;Hygiene/Grooming    Examination-Participation Restrictions  Driving    Stability/Clinical Decision Making  Stable/Uncomplicated    Rehab Potential  Good    PT Frequency  3x / week    PT Duration  6 weeks  PT Treatment/Interventions  Cryotherapy;ADLs/Self Care Home Management;Electrical Stimulation;Iontophoresis 4mg /ml Dexamethasone;Moist Heat;Ultrasound;Therapeutic activities;Therapeutic  exercise;Manual techniques;Dry needling;Passive range of motion;Vasopneumatic Device;Taping;Patient/family education    PT Next Visit Plan  PROM to right shoulder per MD protocol, AAROM Modalities PRN for pain relief 8 weeks post op 01/06/2019    PT Home Exercise Plan  see patient education section    Consulted and Agree with Plan of Care  Patient       Patient will benefit from skilled therapeutic intervention in order to improve the following deficits and impairments:  Pain, Impaired UE functional use, Decreased strength, Decreased range of motion, Postural dysfunction, Decreased knowledge of precautions  Visit Diagnosis: 1. Muscle weakness (generalized)   2. Stiffness of right shoulder, not elsewhere classified   3. Acute pain of right shoulder        Problem List Patient Active Problem List   Diagnosis Date Noted  . GAD (generalized anxiety disorder) 11/15/2018  . Chronic right shoulder pain 08/05/2018  . Abdominal pain 07/10/2018  . Chronic right-sided low back pain with right-sided sciatica 10/10/2017  . Acute recurrent frontal sinusitis 07/03/2017  . Olecranon bursitis of right elbow 07/14/2016  . Migraine 07/14/2016  . Sexual dysfunction 07/14/2016  . History of CVA (cerebrovascular accident) 04/08/2016  . HTN (hypertension), benign 04/08/2016  . Depression 04/08/2016    Marvell FullerKelsey P Nakhia Levitan, PTA 01/07/2019, 9:36 AM  [facility] [address] [place], [place], [address] Phone: [phone]   Fax:  [phone]  Name: Tracy GessLynn H Jensen MRN: [id number] Date of Birth: 06-06-1956

## 2019-01-10 ENCOUNTER — Ambulatory Visit: Payer: 59 | Admitting: Physical Therapy

## 2019-01-10 ENCOUNTER — Encounter: Payer: Self-pay | Admitting: Physical Therapy

## 2019-01-10 ENCOUNTER — Other Ambulatory Visit: Payer: Self-pay | Admitting: Physician Assistant

## 2019-01-10 ENCOUNTER — Other Ambulatory Visit: Payer: Self-pay

## 2019-01-10 DIAGNOSIS — M25611 Stiffness of right shoulder, not elsewhere classified: Secondary | ICD-10-CM

## 2019-01-10 DIAGNOSIS — M25511 Pain in right shoulder: Secondary | ICD-10-CM

## 2019-01-10 DIAGNOSIS — M6281 Muscle weakness (generalized): Secondary | ICD-10-CM

## 2019-01-10 NOTE — Therapy (Signed)
[facility] [address] [place], [place], [address] Phone: [phone]   Fax:  [phone]  Physical Therapy Treatment  Patient Details  Name: Tracy Jensen MRN: [id number] Date of Birth: March 23, 1956 Referring Provider (PT): Norlene CampbellPeter Whitfield, MD   Encounter Date: 01/10/2019  PT End of Session - 01/10/19 0834    Visit Number  18    Number of Visits  30    Date for PT Re-Evaluation  02/18/19    Authorization Type  FOTO; Progress note every 10th visit    PT Start Time  0815    Activity Tolerance  Patient tolerated treatment well    Behavior During Therapy  [facility] for tasks assessed/performed       Past Medical History:  Diagnosis Date  . Hypertension   . Migraines   . Sciatica   . TIA (transient ischemic attack)     History reviewed. No pertinent surgical history.  There were no vitals filed for this visit.  Subjective Assessment - 01/10/19 0833    Subjective  COVID 19 screening performed on patient upon arrival. Patient to see MD for follow up on Wednesday. Reports ongoing stiffness and tightness.    Pertinent History  right RTC repair with SAD, DCR, and biceps tenodesis 11/11/2018, HTN, history of stroke    Limitations  Lifting;House hold activities;Other (comment)    Diagnostic tests  MRI: tear in right shoulder per patient    Patient Stated Goals  use arm without pain, take care of grandchild    Currently in Pain?  No/denies         [facility] - 01/10/19 0001      Assessment   Medical Diagnosis  Chronic R shoulder pain S/P scope    Referring Provider (PT)  Norlene CampbellPeter Whitfield, MD    Onset Date/Surgical Date  11/11/18    Hand Dominance  Right    Next MD Visit  01/12/2019    Prior Therapy  yes      Precautions   Precautions  Shoulder    Type of Shoulder Precautions  Right RTC repair, mini open, SAD, DCR, biceps tenodesis      ROM / Strength   AROM / PROM / Strength  PROM      PROM   Right Shoulder Flexion  146  Degrees    Right Shoulder ABduction  100 Degrees    Right Shoulder Internal Rotation  72 Degrees    Right Shoulder External Rotation  57 Degrees                                PT Long Term Goals - 12/24/18 [id number]      PT LONG TERM GOAL #1   Title  Patient will be independent with HEP and its progression.    Time  6    Period  Weeks    Status  Achieved      PT LONG TERM GOAL #2   Title  Patient will demonstrate  145+ degrees of right shoulder flexion AROM to improve ability to perform functional tasks.    Time  6    Period  Weeks    Status  On-going      PT LONG TERM GOAL #3   Title  Patient will demonstrate 4/5 or greater right UE strength in all planes to improve ability to perform functional activities.    Time  6    Period  Weeks    Status  On-going      PT LONG TERM GOAL #4   Title  Patient will report ability to perform ADLs and caregiving activities for her grandson with right shoulder pain less than or equal to 3/10.    Time  6    Period  Weeks    Status  On-going      PT LONG TERM GOAL #5   Title  Patient will demonstrate 55+ degrees or greater of right shoulder ER AROM to improve ability to don/doff apparel.    Time  6    Period  Weeks    Status  On-going            Plan - 01/10/19 5573    Clinical Impression Statement  Patient was able to tolerate treatment well with no reports of increased pain. Patient was able to complete all AAROM with only reports of muscle fatigue. Patient see objective for shoulder PROM measurements. No adverse affects noted upon removal of modalities. MD sent for recertification for 4 more weeks at 3x per week to begin AROM and low level strengthening per protocol.    Personal Factors and Comorbidities  Comorbidity 1;Time since onset of injury/illness/exacerbation;Profession    Examination-Activity Limitations  Carry;Reach Overhead;Lift;Caring for Others;Dressing;Hygiene/Grooming    Examination-Participation  Restrictions  Driving    Stability/Clinical Decision Making  Stable/Uncomplicated    Clinical Decision Making  Low    Rehab Potential  Good    PT Frequency  3x / week    PT Duration  6 weeks    PT Treatment/Interventions  Cryotherapy;ADLs/Self Care Home Management;Electrical Stimulation;Iontophoresis 4mg /ml Dexamethasone;Moist Heat;Ultrasound;Therapeutic activities;Therapeutic exercise;Manual techniques;Dry needling;Passive range of motion;Vasopneumatic Device;Taping;Patient/family education    PT Next Visit Plan  PROM to right shoulder per MD protocol, AAROM Modalities PRN for pain relief 8 weeks post op 01/06/2019    PT Home Exercise Plan  see patient education section    Consulted and Agree with Plan of Care  Patient       Patient will benefit from skilled therapeutic intervention in order to improve the following deficits and impairments:  Pain, Impaired UE functional use, Decreased strength, Decreased range of motion, Postural dysfunction, Decreased knowledge of precautions  Visit Diagnosis: 1. Muscle weakness (generalized)   2. Stiffness of right shoulder, not elsewhere classified   3. Acute pain of right shoulder        Problem List Patient Active Problem List   Diagnosis Date Noted  . GAD (generalized anxiety disorder) 11/15/2018  . Chronic right shoulder pain 08/05/2018  . Abdominal pain 07/10/2018  . Chronic right-sided low back pain with right-sided sciatica 10/10/2017  . Acute recurrent frontal sinusitis 07/03/2017  . Olecranon bursitis of right elbow 07/14/2016  . Migraine 07/14/2016  . Sexual dysfunction 07/14/2016  . History of CVA (cerebrovascular accident) 04/08/2016  . HTN (hypertension), benign 04/08/2016  . Depression 04/08/2016    Tracy Jensen 01/10/2019, 8:57 AM  [facility] [address] [place] [employer] [place], [place], [address] Phone: [phone]   Fax:  [phone]  Name: Tracy Jensen MRN: [id number] Date of  Birth: 12-Oct-1955

## 2019-01-12 ENCOUNTER — Ambulatory Visit: Payer: 59 | Admitting: Physical Therapy

## 2019-01-12 ENCOUNTER — Encounter: Payer: Self-pay | Admitting: Orthopaedic Surgery

## 2019-01-12 ENCOUNTER — Other Ambulatory Visit: Payer: Self-pay

## 2019-01-12 ENCOUNTER — Ambulatory Visit (INDEPENDENT_AMBULATORY_CARE_PROVIDER_SITE_OTHER): Payer: 59 | Admitting: Orthopaedic Surgery

## 2019-01-12 VITALS — BP 106/65 | HR 70 | Ht <= 58 in | Wt 110.0 lb

## 2019-01-12 DIAGNOSIS — M6281 Muscle weakness (generalized): Secondary | ICD-10-CM | POA: Diagnosis not present

## 2019-01-12 DIAGNOSIS — M25611 Stiffness of right shoulder, not elsewhere classified: Secondary | ICD-10-CM

## 2019-01-12 DIAGNOSIS — M25511 Pain in right shoulder: Secondary | ICD-10-CM

## 2019-01-12 DIAGNOSIS — G8929 Other chronic pain: Secondary | ICD-10-CM

## 2019-01-12 NOTE — Therapy (Signed)
[facility] [address] [place], [place], [address] Phone: [phone]   Fax:  [phone]  Physical Therapy Treatment  Patient Details  Name: Tracy Jensen MRN: [id number] Date of Birth: 1956/06/16 Referring Provider (PT): Norlene CampbellPeter Whitfield, MD   Encounter Date: 01/12/2019  PT End of Session - 01/12/19 0854    Visit Number  19    Number of Visits  30    Date for PT Re-Evaluation  02/18/19    Authorization Type  FOTO; Progress note every 10th visit    PT Start Time  0815    PT Stop Time  0904    PT Time Calculation (min)  49 min    Activity Tolerance  Patient tolerated treatment well    Behavior During Therapy  [facility] for tasks assessed/performed       Past Medical History:  Diagnosis Date  . Hypertension   . Migraines   . Sciatica   . TIA (transient ischemic attack)     No past surgical history on file.  There were no vitals filed for this visit.  Subjective Assessment - 01/12/19 0821    Subjective  COVID 19 screening performed on patient upon arrival. Reported no new complaints.    Pertinent History  right RTC repair with SAD, DCR, and biceps tenodesis 11/11/2018, HTN, history of stroke    Limitations  Lifting;House hold activities;Other (comment)    Diagnostic tests  MRI: tear in right shoulder per patient    Patient Stated Goals  use arm without pain, take care of grandchild    Currently in Pain?  No/denies         [facility] - 01/12/19 0001      Assessment   Medical Diagnosis  Chronic R shoulder pain S/P scope    Referring Provider (PT)  Norlene CampbellPeter Whitfield, MD    Onset Date/Surgical Date  11/11/18    Hand Dominance  Right    Next MD Visit  01/12/2019    Prior Therapy  yes      Precautions   Precautions  Shoulder    Type of Shoulder Precautions  Right RTC repair, mini open, SAD, DCR, biceps tenodesis                   [facility] Adult PT Treatment/Exercise - 01/12/19 0001      Shoulder Exercises:  Supine   External Rotation  AAROM;Right;20 reps      Shoulder Exercises: Prone   Retraction  Right;20 reps    Extension  AROM;Right;20 reps      Shoulder Exercises: Standing   Internal Rotation  AAROM;Both;20 reps      Shoulder Exercises: Pulleys   Flexion  5 minutes      Shoulder Exercises: ROM/Strengthening   Wall Wash  flexion, cw, ccw, x3 minutes each      Modalities   Modalities  Vasopneumatic;Electrical Stimulation      Electrical Stimulation   Electrical Stimulation Location  R shoulder    Electrical Stimulation Action  IFC    Electrical Stimulation Parameters  80-150 hz x10 mins    Electrical Stimulation Goals  Pain      Vasopneumatic   Number Minutes Vasopneumatic   10 minutes    Vasopnuematic Location   Shoulder    Vasopneumatic Pressure  Low    Vasopneumatic Temperature   34      Manual Therapy   Manual Therapy  Passive ROM    Passive ROM  PROM to right  shoulder into flexion, abd, IR and ER to improve range                  PT Long Term Goals - 01/10/19 0901      PT LONG TERM GOAL #1   Title  Patient will be independent with HEP and its progression.    Time  6    Period  Weeks    Status  Achieved      PT LONG TERM GOAL #2   Title  Patient will demonstrate  145+ degrees of right shoulder flexion AROM to improve ability to perform functional tasks.    Time  6    Period  Weeks    Status  On-going      PT LONG TERM GOAL #3   Title  Patient will demonstrate 4/5 or greater right UE strength in all planes to improve ability to perform functional activities.    Time  6    Period  Weeks    Status  Unable to assess      PT LONG TERM GOAL #4   Title  Patient will report ability to perform ADLs and caregiving activities for her grandson with right shoulder pain less than or equal to 3/10.    Period  Weeks    Status  On-going      PT LONG TERM GOAL #5   Title  Patient will demonstrate 55+ degrees or greater of right shoulder ER AROM to improve  ability to don/doff apparel.    Period  Weeks    Status  On-going            Plan - 01/12/19 0272    Clinical Impression Statement  Patient responded well the addition of new TEs. Patient attempted scapular retraction with visual cuing and tactile cuing but patient unable to perform properly therefore exercise was terminated. Patient noted with smooth end feels in all right shoulder PROM. No adverse affects noted upon removal of modalities.    Personal Factors and Comorbidities  Comorbidity 1;Time since onset of injury/illness/exacerbation;Profession    Examination-Activity Limitations  Carry;Reach Overhead;Lift;Caring for Others;Dressing;Hygiene/Grooming    Examination-Participation Restrictions  Driving    Stability/Clinical Decision Making  Stable/Uncomplicated    Clinical Decision Making  Low    Rehab Potential  Good    PT Frequency  3x / week    PT Duration  6 weeks    PT Treatment/Interventions  Cryotherapy;ADLs/Self Care Home Management;Electrical Stimulation;Iontophoresis 4mg /ml Dexamethasone;Moist Heat;Ultrasound;Therapeutic activities;Therapeutic exercise;Manual techniques;Dry needling;Passive range of motion;Vasopneumatic Device;Taping;Patient/family education    PT Next Visit Plan  PROM to right shoulder per MD protocol, AAROM and AROM for right shoulder Modalities PRN for pain relief 8 weeks post op 01/06/2019    Consulted and Agree with Plan of Care  Patient       Patient will benefit from skilled therapeutic intervention in order to improve the following deficits and impairments:  Pain, Impaired UE functional use, Decreased strength, Decreased range of motion, Postural dysfunction, Decreased knowledge of precautions  Visit Diagnosis: 1. Muscle weakness (generalized)   2. Stiffness of right shoulder, not elsewhere classified   3. Acute pain of right shoulder        Problem List Patient Active Problem List   Diagnosis Date Noted  . GAD (generalized anxiety  disorder) 11/15/2018  . Chronic right shoulder pain 08/05/2018  . Abdominal pain 07/10/2018  . Chronic right-sided low back pain with right-sided sciatica 10/10/2017  . Acute recurrent frontal sinusitis 07/03/2017  .  Olecranon bursitis of right elbow 07/14/2016  . Migraine 07/14/2016  . Sexual dysfunction 07/14/2016  . History of CVA (cerebrovascular accident) 04/08/2016  . HTN (hypertension), benign 04/08/2016  . Depression 04/08/2016    Guss BundeKrystle Shamyia Grandpre, PT, DPT 01/12/2019, 9:11 AM  [facility] [address] [place], [place], [address] Phone: [phone]   Fax:  [phone]  Name: Tracy Jensen MRN: [id number] Date of Birth: Feb 06, 1956

## 2019-01-12 NOTE — Progress Notes (Signed)
Office Visit Note   Patient: Tracy Jensen           Date of Birth: 1956-01-14           MRN: [id number] Visit Date: 01/12/2019              Requested by: Terald Sleeper, PA-C [address] [place],   [address] PCP: Terald Sleeper, PA-C   Assessment & Plan: Visit Diagnoses:  1. Chronic right shoulder pain     Plan: 9 weeks status post rotator cuff tear repair of right shoulder and doing well.  Continues to go to physical therapy.  We will keep her out of work at least 2-4 more weeks and if a change in her work status she will let me know we can alter the note.  Otherwise I will see her back in a month.  Still having some discomfort but I do not think she is having the pain that she had preoperatively.  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   Orders:  No orders of the defined types were placed in this encounter.  No orders of the defined types were placed in this encounter.     Procedures: No procedures performed   Clinical Data: No additional findings.   Subjective: Chief Complaint  Patient presents with  . Right Shoulder - Routine Post Op    Right shoulder scope DOS 11/11/2018  Patient presents today for a follow up on her right shoulder. She is now two months out from right shoulder arthroscopy with rotator cuff repair on 11/11/2018. She is still in physical therapy.  HPI  Review of Systems   Objective: Vital Signs: BP 106/65   Pulse 70   Ht 4\' 9"  (1.448 m)   Wt 110 lb (49.9 kg)   BMI 23.80 kg/m   Physical Exam  Ortho Exam right shoulder arthroscopic incisions and small rotator cuff tear repair incision healing without problem.  I could just about place her right arm fully overhead passively and lacks a little less actively.  Could abduct easily and quickly to 90  degrees.  No impingement.  Neurovascular exam intact  Specialty Comments:  No specialty comments available.  Imaging: No results found.   PMFS History: Patient Active Problem List   Diagnosis Date Noted  . GAD (generalized anxiety disorder) 11/15/2018  . Chronic right shoulder pain 08/05/2018  . Abdominal pain 07/10/2018  . Chronic right-sided low back pain with right-sided sciatica 10/10/2017  . Acute recurrent frontal sinusitis 07/03/2017  . Olecranon bursitis of right elbow 07/14/2016  . Migraine 07/14/2016  . Sexual dysfunction 07/14/2016  . History of CVA (cerebrovascular accident) 04/08/2016  . HTN (hypertension), benign 04/08/2016  . Depression 04/08/2016   Past Medical History:  Diagnosis Date  . Hypertension   . Migraines   . Sciatica   . TIA (transient ischemic attack)     History reviewed. No pertinent family history.  History reviewed. No pertinent surgical history. Social History   Occupational History  . Not on file  Tobacco Use  . Smoking status: Never Smoker  . Smokeless tobacco: Never Used  Substance and Sexual Activity  . Alcohol use: Yes    Comment: occ  . Drug use: No  .  Sexual activity: Not on file

## 2019-01-14 ENCOUNTER — Other Ambulatory Visit: Payer: Self-pay

## 2019-01-14 ENCOUNTER — Ambulatory Visit: Payer: 59 | Admitting: Physical Therapy

## 2019-01-14 ENCOUNTER — Encounter: Payer: Self-pay | Admitting: Physical Therapy

## 2019-01-14 DIAGNOSIS — M25511 Pain in right shoulder: Secondary | ICD-10-CM

## 2019-01-14 DIAGNOSIS — M6281 Muscle weakness (generalized): Secondary | ICD-10-CM

## 2019-01-14 DIAGNOSIS — M25611 Stiffness of right shoulder, not elsewhere classified: Secondary | ICD-10-CM

## 2019-01-14 NOTE — Therapy (Signed)
[facility] [place], [place], [address] Phone: [phone]   Fax:  [phone]  Physical Therapy Treatment  Progress Note Reporting Period 12/22/2018 to 01/14/2019  See note below for Objective Data and Assessment of Progress/Goals.  Patient is progressing well with PROM, see 7/29 visit for measurements. Began AROM therapeutic exercises with minimal complaints of pain.   Patient Details  Name: Tracy Jensen MRN: [id number] Date of Birth: June 01, 1956 Referring Provider (PT): Joni Fears, MD   Encounter Date: 01/14/2019  PT End of Session - 01/14/19 0854    Visit Number  20    Number of Visits  30    Date for PT Re-Evaluation  02/18/19    Authorization Type  FOTO; Progress note every 10th visit    PT Start Time  0815    PT Stop Time  0901    PT Time Calculation (min)  46 min    Activity Tolerance  Patient tolerated treatment well    Behavior During Therapy  [facility] for tasks assessed/performed       Past Medical History:  Diagnosis Date  . Hypertension   . Migraines   . Sciatica   . TIA (transient ischemic attack)     History reviewed. No pertinent surgical history.  There were no vitals filed for this visit.  Subjective Assessment - 01/14/19 0823    Subjective  COVID 19 screening performed on patient upon arrival. Reported MD's appointment went well and she stated she is out of work until Aug 17th.    Pertinent History  right RTC repair with SAD, DCR, and biceps tenodesis 11/11/2018, HTN, history of stroke    Limitations  Lifting;House hold activities;Other (comment)    Diagnostic tests  MRI: tear in right shoulder per patient    Patient Stated Goals  use arm without pain, take care of grandchild    Currently in Pain?  Yes    Pain Score  3     Pain Location  Shoulder    Pain Orientation  Right    Pain Descriptors / Indicators  Sore    Pain Type  Surgical pain    Pain Onset  1 to 4 weeks ago         [facility]  PT Assessment - 01/14/19 0001      Assessment   Medical Diagnosis  Chronic R shoulder pain S/P scope    Referring Provider (PT)  Joni Fears, MD    Onset Date/Surgical Date  11/11/18    Hand Dominance  Right    Next MD Visit  02/09/2019    Prior Therapy  yes      Precautions   Precautions  Shoulder    Type of Shoulder Precautions  Right RTC repair, mini open, SAD, DCR, biceps tenodesis                   [facility] Adult PT Treatment/Exercise - 01/14/19 0001      Shoulder Exercises: Supine   Protraction  AROM;Right;20 reps    Flexion  AROM;Right;20 reps      Shoulder Exercises: Sidelying   External Rotation  AROM;Right;15 reps      Modalities   Modalities  Vasopneumatic;Electrical Stimulation      Electrical Stimulation   Electrical Stimulation Location  R shoulder    Electrical Stimulation Action  IFC    Electrical Stimulation Parameters  80-150 hz x10 mins    Electrical Stimulation Goals  Pain      Vasopneumatic  Number Minutes Vasopneumatic   10 minutes    Vasopnuematic Location   Shoulder    Vasopneumatic Pressure  Low    Vasopneumatic Temperature   34      Manual Therapy   Manual Therapy  Passive ROM    Passive ROM  PROM to right shoulder into flexion, abd, IR and ER to improve range. Rhythmic stabilization in 90 degrees of flexion. PNF D2 flexion.                  PT Long Term Goals - 01/10/19 0901      PT LONG TERM GOAL #1   Title  Patient will be independent with HEP and its progression.    Time  6    Period  Weeks    Status  Achieved      PT LONG TERM GOAL #2   Title  Patient will demonstrate  145+ degrees of right shoulder flexion AROM to improve ability to perform functional tasks.    Time  6    Period  Weeks    Status  On-going      PT LONG TERM GOAL #3   Title  Patient will demonstrate 4/5 or greater right UE strength in all planes to improve ability to perform functional activities.    Time  6    Period  Weeks    Status   Unable to assess      PT LONG TERM GOAL #4   Title  Patient will report ability to perform ADLs and caregiving activities for her grandson with right shoulder pain less than or equal to 3/10.    Period  Weeks    Status  On-going      PT LONG TERM GOAL #5   Title  Patient will demonstrate 55+ degrees or greater of right shoulder ER AROM to improve ability to don/doff apparel.    Period  Weeks    Status  On-going            Plan - 01/14/19 0855    Clinical Impression Statement  Patient arrives with ongoing soreness. Patient was able to tolerate the progression of treatment to AROM fairly well. Patient and PT discussed slowly progressing to build strength for work activities. No adverse affects noted upon removal.    Personal Factors and Comorbidities  Comorbidity 1;Time since onset of injury/illness/exacerbation;Profession    Examination-Activity Limitations  Carry;Reach Overhead;Lift;Caring for Others;Dressing;Hygiene/Grooming    Examination-Participation Restrictions  Driving    Stability/Clinical Decision Making  Stable/Uncomplicated    Clinical Decision Making  Low    Rehab Potential  Good    PT Frequency  3x / week    PT Duration  6 weeks    PT Treatment/Interventions  Cryotherapy;ADLs/Self Care Home Management;Electrical Stimulation;Iontophoresis 4mg /ml Dexamethasone;Moist Heat;Ultrasound;Therapeutic activities;Therapeutic exercise;Manual techniques;Dry needling;Passive range of motion;Vasopneumatic Device;Taping;Patient/family education    PT Next Visit Plan  PROM to right shoulder for end range, AROM for right shoulder, Modalities PRN for pain relief 8 weeks post op 01/06/2019    Consulted and Agree with Plan of Care  Patient       Patient will benefit from skilled therapeutic intervention in order to improve the following deficits and impairments:  Pain, Impaired UE functional use, Decreased strength, Decreased range of motion, Postural dysfunction, Decreased knowledge of  precautions  Visit Diagnosis: 1. Muscle weakness (generalized)   2. Stiffness of right shoulder, not elsewhere classified   3. Acute pain of right shoulder  Problem List Patient Active Problem List   Diagnosis Date Noted  . GAD (generalized anxiety disorder) 11/15/2018  . Chronic right shoulder pain 08/05/2018  . Abdominal pain 07/10/2018  . Chronic right-sided low back pain with right-sided sciatica 10/10/2017  . Acute recurrent frontal sinusitis 07/03/2017  . Olecranon bursitis of right elbow 07/14/2016  . Migraine 07/14/2016  . Sexual dysfunction 07/14/2016  . History of CVA (cerebrovascular accident) 04/08/2016  . HTN (hypertension), benign 04/08/2016  . Depression 04/08/2016   Guss BundeKrystle Joushua Dugar, PT, DPT 01/14/2019, 9:08 AM  [facility] [address] [place], [place], [address] Phone: [phone]   Fax:  [phone]  Name: Tracy Jensen MRN: [id number] Date of Birth: 1955/07/12

## 2019-01-17 ENCOUNTER — Other Ambulatory Visit: Payer: Self-pay

## 2019-01-17 ENCOUNTER — Encounter: Payer: Self-pay | Admitting: Physical Therapy

## 2019-01-17 ENCOUNTER — Ambulatory Visit: Payer: 59 | Attending: Orthopaedic Surgery | Admitting: Physical Therapy

## 2019-01-17 DIAGNOSIS — M25611 Stiffness of right shoulder, not elsewhere classified: Secondary | ICD-10-CM | POA: Diagnosis present

## 2019-01-17 DIAGNOSIS — G8929 Other chronic pain: Secondary | ICD-10-CM | POA: Diagnosis present

## 2019-01-17 DIAGNOSIS — M25511 Pain in right shoulder: Secondary | ICD-10-CM | POA: Insufficient documentation

## 2019-01-17 DIAGNOSIS — M6281 Muscle weakness (generalized): Secondary | ICD-10-CM | POA: Diagnosis not present

## 2019-01-17 NOTE — Therapy (Signed)
[facility] [address] [place], [place], [address] Phone: [phone]   Fax:  [phone]  Physical Therapy Treatment  Patient Details  Name: Rosalyn GessLynn H Prigmore MRN: [id number] Date of Birth: 1956-01-24 Referring Provider (PT): Norlene CampbellPeter Whitfield, MD   Encounter Date: 01/17/2019  PT End of Session - 01/17/19 0916    Visit Number  21    Number of Visits  30    Date for PT Re-Evaluation  02/18/19    Authorization Type  FOTO; Progress note every 10th visit    PT Start Time  0900    PT Stop Time  0953    PT Time Calculation (min)  53 min    Activity Tolerance  Patient tolerated treatment well    Behavior During Therapy  [facility] for tasks assessed/performed       Past Medical History:  Diagnosis Date  . Hypertension   . Migraines   . Sciatica   . TIA (transient ischemic attack)     History reviewed. No pertinent surgical history.  There were no vitals filed for this visit.  Subjective Assessment - 01/17/19 0914    Subjective  COVID 19 screening performed on patient upon arrival. Reported feeling good but was throbbing yesterday.    Pertinent History  right RTC repair with SAD, DCR, and biceps tenodesis 11/11/2018, HTN, history of stroke    Limitations  Lifting;House hold activities;Other (comment)    Diagnostic tests  MRI: tear in right shoulder per patient    Patient Stated Goals  use arm without pain, take care of grandchild    Currently in Pain?  Yes   did not provide number on pain scale   Pain Location  Shoulder    Pain Orientation  Right    Pain Descriptors / Indicators  Sore    Pain Type  Surgical pain    Pain Onset  1 to 4 weeks ago    Pain Frequency  Constant         [facility] PT Assessment - 01/17/19 0001      Assessment   Medical Diagnosis  Chronic R shoulder pain S/P scope    Referring Provider (PT)  Norlene CampbellPeter Whitfield, MD    Onset Date/Surgical Date  11/11/18    Hand Dominance  Right    Next MD Visit  02/09/2019    Prior  Therapy  yes      Precautions   Precautions  Shoulder    Type of Shoulder Precautions  Right RTC repair, mini open, SAD, DCR, biceps tenodesis                   [facility] Adult PT Treatment/Exercise - 01/17/19 0001      Shoulder Exercises: Standing   Internal Rotation  --    Flexion  Strengthening;20 reps   to bottom shelf and middle shelf x20 each     Shoulder Exercises: Pulleys   Flexion  5 minutes      Shoulder Exercises: ROM/Strengthening   UBE (Upper Arm Bike)  120 RPM x6 mins     Ranger  standing UE ranger in flexion, CW, CCW, x3 minutes each      Shoulder Exercises: Stretch   Corner Stretch  3 reps;30 seconds    Cross Chest Stretch  2 reps;30 seconds      Modalities   Modalities  Vasopneumatic;Electrical Stimulation      Programme researcher, broadcasting/film/videolectrical Stimulation   Electrical Stimulation Location  R shoulder    Electrical Stimulation Action  IFC    Electrical Stimulation Parameters  80-150 hz x10 mins      Vasopneumatic   Number Minutes Vasopneumatic   10 minutes    Vasopnuematic Location   Shoulder    Vasopneumatic Pressure  Low    Vasopneumatic Temperature   34      Manual Therapy   Manual Therapy  Passive ROM    Passive ROM  PROM to right shoulder into flexion, abd, IR and ER to improve range with slight hold to improve range                  PT Long Term Goals - 01/10/19 0901      PT LONG TERM GOAL #1   Title  Patient will be independent with HEP and its progression.    Time  6    Period  Weeks    Status  Achieved      PT LONG TERM GOAL #2   Title  Patient will demonstrate  145+ degrees of right shoulder flexion AROM to improve ability to perform functional tasks.    Time  6    Period  Weeks    Status  On-going      PT LONG TERM GOAL #3   Title  Patient will demonstrate 4/5 or greater right UE strength in all planes to improve ability to perform functional activities.    Time  6    Period  Weeks    Status  Unable to assess      PT LONG TERM  GOAL #4   Title  Patient will report ability to perform ADLs and caregiving activities for her grandson with right shoulder pain less than or equal to 3/10.    Period  Weeks    Status  On-going      PT LONG TERM GOAL #5   Title  Patient will demonstrate 55+ degrees or greater of right shoulder ER AROM to improve ability to don/doff apparel.    Period  Weeks    Status  On-going            Plan - 01/17/19 0947    Clinical Impression Statement  Patient responded well to therapy despite ongoing soreness. Patient noted with slight trunk compensation with cone to middle shelf towards end of reps. Patient educated we are strengthening through the range. Patient reported understanding. Patient noted with smooth arc of motion during PROM. Normal response to modalities upon removal. FOTO next visit.    Personal Factors and Comorbidities  Comorbidity 1;Time since onset of injury/illness/exacerbation;Profession    Examination-Activity Limitations  Carry;Reach Overhead;Lift;Caring for Others;Dressing;Hygiene/Grooming    Examination-Participation Restrictions  Driving    Stability/Clinical Decision Making  Stable/Uncomplicated    Clinical Decision Making  Low    Rehab Potential  Good    PT Frequency  3x / week    PT Duration  6 weeks    PT Treatment/Interventions  Cryotherapy;ADLs/Self Care Home Management;Electrical Stimulation;Iontophoresis 4mg /ml Dexamethasone;Moist Heat;Ultrasound;Therapeutic activities;Therapeutic exercise;Manual techniques;Dry needling;Passive range of motion;Vasopneumatic Device;Taping;Patient/family education    PT Next Visit Plan  PROM to right shoulder for end range, AROM for right shoulder, Modalities PRN for pain relief 8 weeks post op 01/06/2019    PT Home Exercise Plan  see patient education section    Consulted and Agree with Plan of Care  Patient       Patient will benefit from skilled therapeutic intervention in order to improve the following deficits and  impairments:  Pain, Impaired UE functional  use, Decreased strength, Decreased range of motion, Postural dysfunction, Decreased knowledge of precautions  Visit Diagnosis: 1. Muscle weakness (generalized)   2. Stiffness of right shoulder, not elsewhere classified   3. Acute pain of right shoulder        Problem List Patient Active Problem List   Diagnosis Date Noted  . GAD (generalized anxiety disorder) 11/15/2018  . Chronic right shoulder pain 08/05/2018  . Abdominal pain 07/10/2018  . Chronic right-sided low back pain with right-sided sciatica 10/10/2017  . Acute recurrent frontal sinusitis 07/03/2017  . Olecranon bursitis of right elbow 07/14/2016  . Migraine 07/14/2016  . Sexual dysfunction 07/14/2016  . History of CVA (cerebrovascular accident) 04/08/2016  . HTN (hypertension), benign 04/08/2016  . Depression 04/08/2016   [name], PT, DPT 01/17/2019, 10:48 AM  [facility] [address] [place], [place], [address] Phone: [phone]   Fax:  [phone]  Name: LOYAL RUDY MRN: [id number] Date of Birth: [birth year]

## 2019-01-19 ENCOUNTER — Other Ambulatory Visit: Payer: Self-pay

## 2019-01-19 ENCOUNTER — Ambulatory Visit: Payer: 59 | Admitting: Physical Therapy

## 2019-01-19 DIAGNOSIS — M25511 Pain in right shoulder: Secondary | ICD-10-CM

## 2019-01-19 DIAGNOSIS — M25611 Stiffness of right shoulder, not elsewhere classified: Secondary | ICD-10-CM

## 2019-01-19 DIAGNOSIS — M6281 Muscle weakness (generalized): Secondary | ICD-10-CM

## 2019-01-19 NOTE — Therapy (Signed)
[facility] [place], [place], [address] Phone: [phone]   Fax:  [phone]  Physical Therapy Treatment  Patient Details  Name: Tracy Jensen MRN: [id number] Date of Birth: [birth year] Referring Provider (PT): Joni Fears, MD   Encounter Date: 01/19/2019  PT End of Session - 01/19/19 0922    Visit Number  22    Number of Visits  30    Date for PT Re-Evaluation  02/18/19    Authorization Type  FOTO; Progress note every 10th visit    PT Start Time  0815    PT Stop Time  0906    PT Time Calculation (min)  51 min    Activity Tolerance  Patient tolerated treatment well    Behavior During Therapy  [facility] for tasks assessed/performed       Past Medical History:  Diagnosis Date  . Hypertension   . Migraines   . Sciatica   . TIA (transient ischemic attack)     No past surgical history on file.  There were no vitals filed for this visit.  Subjective Assessment - 01/19/19 0834    Subjective  COVID 19 screening performed on patient upon arrival. Reported ongoing tightness and stiffness.    Pertinent History  right RTC repair with SAD, DCR, and biceps tenodesis 11/11/2018, HTN, history of stroke    Limitations  Lifting;House hold activities;Other (comment)    Diagnostic tests  MRI: tear in right shoulder per patient    Patient Stated Goals  use arm without pain, take care of grandchild    Currently in Pain?  No/denies         [facility] PT Assessment - 01/19/19 0001      Assessment   Medical Diagnosis  Chronic R shoulder pain S/P scope    Referring Provider (PT)  Joni Fears, MD    Onset Date/Surgical Date  11/11/18    Hand Dominance  Right    Next MD Visit  02/09/2019    Prior Therapy  yes      Precautions   Precautions  Shoulder    Type of Shoulder Precautions  Right RTC repair, mini open, SAD, DCR, biceps tenodesis                   [facility] Adult PT Treatment/Exercise - 01/19/19 0001      Shoulder Exercises: Prone   Retraction  --    Extension  --      Shoulder Exercises: Standing   Internal Rotation  AAROM;Both;20 reps    Extension  AAROM;Both;20 reps    Other Standing Exercises  AAROM behind the back horizontal adduction x20       Shoulder Exercises: Pulleys   Flexion  5 minutes      Shoulder Exercises: ROM/Strengthening   UBE (Upper Arm Bike)  120 RPM x6 mins     Ranger  standing UE ranger in flexion, CW, CCW, x3 minutes each      Shoulder Exercises: Stretch   Internal Rotation Stretch  2 reps   20 seconds     Modalities   Modalities  Vasopneumatic;Electrical Stimulation      Electrical Stimulation   Electrical Stimulation Location  R shoulder    Electrical Stimulation Action  IFC    Electrical Stimulation Parameters  80-150 hz x10 mins    Electrical Stimulation Goals  Pain      Vasopneumatic   Number Minutes Vasopneumatic   10 minutes    Vasopnuematic Location  Shoulder    Vasopneumatic Pressure  Low    Vasopneumatic Temperature   34                  PT Long Term Goals - 01/10/19 0901      PT LONG TERM GOAL #1   Title  Patient will be independent with HEP and its progression.    Time  6    Period  Weeks    Status  Achieved      PT LONG TERM GOAL #2   Title  Patient will demonstrate  145+ degrees of right shoulder flexion AROM to improve ability to perform functional tasks.    Time  6    Period  Weeks    Status  On-going      PT LONG TERM GOAL #3   Title  Patient will demonstrate 4/5 or greater right UE strength in all planes to improve ability to perform functional activities.    Time  6    Period  Weeks    Status  Unable to assess      PT LONG TERM GOAL #4   Title  Patient will report ability to perform ADLs and caregiving activities for her grandson with right shoulder pain less than or equal to 3/10.    Period  Weeks    Status  On-going      PT LONG TERM GOAL #5   Title  Patient will demonstrate 55+ degrees or greater  of right shoulder ER AROM to improve ability to don/doff apparel.    Period  Weeks    Status  On-going            Plan - 01/19/19 [id number]    Clinical Impression Statement  Patient was able to tolerate treatment well with progression of behind the back AAROM exercises. Patient required multiple cues for form as patient was unable to properly perform IR towel stretch without tactile cuing. Patient provided with HEP consisting of AAROM extension, IR and horizontal adduction to improve functional IR. Patient reported understanding.    Personal Factors and Comorbidities  Comorbidity 1;Time since onset of injury/illness/exacerbation;Profession    Examination-Activity Limitations  Carry;Reach Overhead;Lift;Caring for Others;Dressing;Hygiene/Grooming    Examination-Participation Restrictions  Driving    Stability/Clinical Decision Making  Stable/Uncomplicated    Clinical Decision Making  Low    Rehab Potential  Good    PT Frequency  3x / week    PT Duration  6 weeks    PT Treatment/Interventions  Cryotherapy;ADLs/Self Care Home Management;Electrical Stimulation;Iontophoresis 4mg /ml Dexamethasone;Moist Heat;Ultrasound;Therapeutic activities;Therapeutic exercise;Manual techniques;Dry needling;Passive range of motion;Vasopneumatic Device;Taping;Patient/family education    PT Next Visit Plan  PROM to right shoulder for end range, AROM for right shoulder, Modalities PRN for pain relief 8 weeks post op 01/06/2019    PT Home Exercise Plan  see patient education section    Consulted and Agree with Plan of Care  Patient       Patient will benefit from skilled therapeutic intervention in order to improve the following deficits and impairments:  Pain, Impaired UE functional use, Decreased strength, Decreased range of motion, Postural dysfunction, Decreased knowledge of precautions  Visit Diagnosis: 1. Muscle weakness (generalized)   2. Stiffness of right shoulder, not elsewhere classified   3. Acute pain  of right shoulder        Problem List Patient Active Problem List   Diagnosis Date Noted  . GAD (generalized anxiety disorder) 11/15/2018  . Chronic right shoulder pain 08/05/2018  .  Abdominal pain 07/10/2018  . Chronic right-sided low back pain with right-sided sciatica 10/10/2017  . Acute recurrent frontal sinusitis 07/03/2017  . Olecranon bursitis of right elbow 07/14/2016  . Migraine 07/14/2016  . Sexual dysfunction 07/14/2016  . History of CVA (cerebrovascular accident) 04/08/2016  . HTN (hypertension), benign 04/08/2016  . Depression 04/08/2016   Guss BundeKrystle Percy Winterrowd, PT, DPT 01/19/2019, 9:26 AM  [facility] [address] [place], [place], [address] Phone: [phone]   Fax:  [phone]  Name: Tracy Jensen MRN: [id number] Date of Birth: Mar 30, 1956

## 2019-01-21 ENCOUNTER — Other Ambulatory Visit: Payer: Self-pay

## 2019-01-21 ENCOUNTER — Ambulatory Visit: Payer: 59 | Admitting: Physical Therapy

## 2019-01-21 DIAGNOSIS — M25611 Stiffness of right shoulder, not elsewhere classified: Secondary | ICD-10-CM

## 2019-01-21 DIAGNOSIS — M25511 Pain in right shoulder: Secondary | ICD-10-CM

## 2019-01-21 DIAGNOSIS — M6281 Muscle weakness (generalized): Secondary | ICD-10-CM

## 2019-01-21 NOTE — Therapy (Signed)
[facility] [address] [place], [place], [address] Phone: [phone]   Fax:  [phone]  Physical Therapy Treatment  Patient Details  Name: Tracy Tracy Jensen MRN: [id number] Date of Birth: 1955-12-31 Referring Provider (PT): Tracy CampbellPeter Whitfield, MD   Encounter Date: 01/21/2019  PT End of Session - 01/21/19 1037    Visit Number  23    Number of Visits  30    Date for PT Re-Evaluation  02/18/19    Authorization Type  FOTO; Progress note every 10th visit    PT Start Time  0900    PT Stop Time  0953    PT Time Calculation (min)  53 min    Activity Tolerance  Patient tolerated treatment well    Behavior During Therapy  [facility] for tasks assessed/performed       Past Medical History:  Diagnosis Date  . Hypertension   . Migraines   . Sciatica   . TIA (transient ischemic attack)     No past surgical history on file.  There were no vitals filed for this visit.  Subjective Assessment - 01/21/19 1039    Subjective  COVID 19 screening performed on patient upon arrival. Reported right shoulder tightness and has been performing HEP.    Pertinent History  right RTC repair with SAD, DCR, and biceps tenodesis 11/11/2018, HTN, history of stroke    Limitations  Lifting;House hold activities;Other (comment)    Diagnostic tests  MRI: tear in right shoulder per patient    Patient Stated Goals  use arm without pain, take care of grandchild    Currently in Pain?  Yes    Pain Score  2     Pain Location  Shoulder    Pain Orientation  Right    Pain Descriptors / Indicators  Tightness    Pain Type  Surgical pain    Pain Onset  1 to 4 weeks ago    Pain Frequency  Constant         [facility] PT Assessment - 01/21/19 0001      Assessment   Medical Diagnosis  Chronic R shoulder pain S/P scope    Referring Provider (PT)  Tracy CampbellPeter Whitfield, MD    Onset Date/Surgical Date  11/11/18    Hand Dominance  Right    Next MD Visit  02/09/2019    Prior Therapy  yes       Precautions   Precautions  Shoulder    Type of Shoulder Precautions  Right RTC repair, mini open, SAD, DCR, biceps tenodesis                   [facility] Adult PT Treatment/Exercise - 01/21/19 0001      Shoulder Exercises: Prone   Retraction  Right;20 reps    Retraction Weight (lbs)  1#      Shoulder Exercises: Standing   Flexion  AROM;Left;20 reps   cone to middle shelf   Other Standing Exercises  left bicep curls x20 1#      Shoulder Exercises: Pulleys   Flexion  5 minutes      Shoulder Exercises: ROM/Strengthening   UBE (Upper Arm Bike)  120 RPM x8 mins     Ranger  standing UE ranger in flexion, CW, CCW, x3 minutes each      Shoulder Exercises: Isometric Strengthening   Flexion  5X5"    Extension  5X5"    External Rotation  5X5"    Internal Rotation  5X5"  Modalities   Modalities  Vasopneumatic;Electrical Stimulation      Programme researcher, broadcasting/film/videolectrical Stimulation   Electrical Stimulation Location  R shoulder    Electrical Stimulation Action  IFC    Electrical Stimulation Parameters  80-150    Electrical Stimulation Goals  Pain      Vasopneumatic   Number Minutes Vasopneumatic   10 minutes    Vasopnuematic Location   Shoulder    Vasopneumatic Pressure  Low    Vasopneumatic Temperature   34                  PT Long Term Goals - 01/10/19 0901      PT LONG TERM GOAL #1   Title  Patient will be independent with HEP and its progression.    Time  6    Period  Weeks    Status  Achieved      PT LONG TERM GOAL #2   Title  Patient will demonstrate  145+ degrees of right shoulder flexion AROM to improve ability to perform functional tasks.    Time  6    Period  Weeks    Status  On-going      PT LONG TERM GOAL #3   Title  Patient will demonstrate 4/5 or greater right UE strength in all planes to improve ability to perform functional activities.    Time  6    Period  Weeks    Status  Unable to assess      PT LONG TERM GOAL #4   Title  Patient will report  ability to perform ADLs and caregiving activities for her grandson with right shoulder pain less than or equal to 3/10.    Period  Weeks    Status  On-going      PT LONG TERM GOAL #5   Title  Patient will demonstrate 55+ degrees or greater of right shoulder ER AROM to improve ability to don/doff apparel.    Period  Weeks    Status  On-going            Plan - 01/21/19 0944    Clinical Impression Statement  Patient tolerated treatment well with reports of muscle fatigue. Patient responded well with the initiation of isometrics and 1# weight for bent over rows and bicep curls but required tactile cuing for form and technique. Normal response to modalities upon removal.    Personal Factors and Comorbidities  Comorbidity 1;Time since onset of injury/illness/exacerbation;Profession    Examination-Activity Limitations  Carry;Reach Overhead;Lift;Caring for Others;Dressing;Hygiene/Grooming    Examination-Participation Restrictions  Driving    Stability/Clinical Decision Making  Stable/Uncomplicated    Clinical Decision Making  Low    Rehab Potential  Good    PT Frequency  3x / week    PT Duration  6 weeks    PT Treatment/Interventions  Cryotherapy;ADLs/Self Care Home Management;Electrical Stimulation;Iontophoresis 4mg /ml Dexamethasone;Moist Heat;Ultrasound;Therapeutic activities;Therapeutic exercise;Manual techniques;Dry needling;Passive range of motion;Vasopneumatic Device;Taping;Patient/family education    PT Next Visit Plan  Continue with AAROM and AROM isometrics/ strengthening to right shoulder. Modalities PRN for pain relief.    Consulted and Agree with Plan of Care  Patient       Patient will benefit from skilled therapeutic intervention in order to improve the following deficits and impairments:  Pain, Impaired UE functional use, Decreased strength, Decreased range of motion, Postural dysfunction, Decreased knowledge of precautions  Visit Diagnosis: 1. Muscle weakness (generalized)    2. Stiffness of right shoulder, not elsewhere classified   3. Acute  pain of right shoulder        Problem List Patient Active Problem List   Diagnosis Date Noted  . GAD (generalized anxiety disorder) 11/15/2018  . Chronic right shoulder pain 08/05/2018  . Abdominal pain 07/10/2018  . Chronic right-sided low back pain with right-sided sciatica 10/10/2017  . Acute recurrent frontal sinusitis 07/03/2017  . Olecranon bursitis of right elbow 07/14/2016  . Migraine 07/14/2016  . Sexual dysfunction 07/14/2016  . History of CVA (cerebrovascular accident) 04/08/2016  . HTN (hypertension), benign 04/08/2016  . Depression 04/08/2016    [name], PT, DPT 01/21/2019, 10:41 AM  [facility] [address] [place], [place], [address] Phone: [phone]   Fax:  [phone]  Name: COURTENEY ALDERETE MRN: [id number] Date of Birth: 1955/08/17

## 2019-01-24 ENCOUNTER — Other Ambulatory Visit: Payer: Self-pay

## 2019-01-24 ENCOUNTER — Ambulatory Visit: Payer: 59 | Admitting: *Deleted

## 2019-01-24 DIAGNOSIS — M6281 Muscle weakness (generalized): Secondary | ICD-10-CM

## 2019-01-24 DIAGNOSIS — G8929 Other chronic pain: Secondary | ICD-10-CM

## 2019-01-24 DIAGNOSIS — M25611 Stiffness of right shoulder, not elsewhere classified: Secondary | ICD-10-CM

## 2019-01-24 DIAGNOSIS — M25511 Pain in right shoulder: Secondary | ICD-10-CM

## 2019-01-24 NOTE — Therapy (Signed)
[facility] [place], [place], [address] Phone: [phone]   Fax:  [phone]  Physical Therapy Treatment  Patient Details  Name: Tracy Jensen MRN: [id number] Date of Birth: 02-24-1956 Referring Provider (PT): Joni Fears, MD   Encounter Date: 01/24/2019  PT End of Session - 01/24/19 0819    Visit Number  24    Number of Visits  30    Date for PT Re-Evaluation  02/18/19    Authorization Type  FOTO; Progress note every 10th visit    PT Start Time  0815    PT Stop Time  0906    PT Time Calculation (min)  51 min       Past Medical History:  Diagnosis Date  . Hypertension   . Migraines   . Sciatica   . TIA (transient ischemic attack)     No past surgical history on file.  There were no vitals filed for this visit.  Subjective Assessment - 01/24/19 0820    Subjective  COVID 19 screening performed on patient upon arrival. Reported RT Shldr soreness. DC next visit    Pertinent History  right RTC repair with SAD, DCR, and biceps tenodesis 11/11/2018, HTN, history of stroke    Limitations  Lifting;House hold activities;Other (comment)    Diagnostic tests  MRI: tear in right shoulder per patient    Patient Stated Goals  use arm without pain, take care of grandchild    Currently in Pain?  Yes    Pain Score  2     Pain Location  Shoulder    Pain Type  Surgical pain    Pain Onset  1 to 4 weeks ago                       [facility] Adult PT Treatment/Exercise - 01/24/19 0001      Shoulder Exercises: Prone   Retraction  Right;20 reps;10 reps    Retraction Weight (lbs)  2#      Shoulder Exercises: Standing   Other Standing Exercises  RW4  yellow band x15 each way    Other Standing Exercises  left bicep curls x20 2#      Shoulder Exercises: Pulleys   Flexion  5 minutes      Shoulder Exercises: ROM/Strengthening   UBE (Upper Arm Bike)  120 RPM x8 mins     Ranger  standing UE ranger in flexion, CW,  CCW, x3 minutes each      Modalities   Modalities  Vasopneumatic;Electrical Stimulation      Electrical Stimulation   Electrical Stimulation Location  R shoulder    Electrical Stimulation Action  IFC    Electrical Stimulation Parameters  80-150 hzx 10 mins    Electrical Stimulation Goals  Pain      Vasopneumatic   Number Minutes Vasopneumatic   10 minutes    Vasopnuematic Location   Shoulder    Vasopneumatic Pressure  Low    Vasopneumatic Temperature   34                  PT Long Term Goals - 01/10/19 0901      PT LONG TERM GOAL #1   Title  Patient will be independent with HEP and its progression.    Time  6    Period  Weeks    Status  Achieved      PT LONG TERM GOAL #2   Title  Patient will  demonstrate  145+ degrees of right shoulder flexion AROM to improve ability to perform functional tasks.    Time  6    Period  Weeks    Status  On-going      PT LONG TERM GOAL #3   Title  Patient will demonstrate 4/5 or greater right UE strength in all planes to improve ability to perform functional activities.    Time  6    Period  Weeks    Status  Unable to assess      PT LONG TERM GOAL #4   Title  Patient will report ability to perform ADLs and caregiving activities for her grandson with right shoulder pain less than or equal to 3/10.    Period  Weeks    Status  On-going      PT LONG TERM GOAL #5   Title  Patient will demonstrate 55+ degrees or greater of right shoulder ER AROM to improve ability to don/doff apparel.    Period  Weeks    Status  On-going            Plan - 01/24/19 [id number]    Clinical Impression Statement  DC next visit as per Pt. She peports that she will return BTW 01-31-19. Pt was able to perform AAROM,AROM and light strengthening exs with Verbal and tactile cues. Check LTGs , HEP and DC next visit.    Personal Factors and Comorbidities  Comorbidity 1;Time since onset of injury/illness/exacerbation;Profession    Examination-Activity  Limitations  Carry;Reach Overhead;Lift;Caring for Others;Dressing;Hygiene/Grooming    Examination-Participation Restrictions  Driving    Stability/Clinical Decision Making  Stable/Uncomplicated    Rehab Potential  Good    PT Frequency  3x / week    PT Duration  6 weeks    PT Treatment/Interventions  Cryotherapy;ADLs/Self Care Home Management;Electrical Stimulation;Iontophoresis 4mg /ml Dexamethasone;Moist Heat;Ultrasound;Therapeutic activities;Therapeutic exercise;Manual techniques;Dry needling;Passive range of motion;Vasopneumatic Device;Taping;Patient/family education    PT Next Visit Plan  Continue with AAROM and AROM isometrics/ strengthening to right shoulder. Modalities PRN for pain relief.   DC as per Pt.  BTW 01-31-19    Consulted and Agree with Plan of Care  Patient       Patient will benefit from skilled therapeutic intervention in order to improve the following deficits and impairments:  Pain, Impaired UE functional use, Decreased strength, Decreased range of motion, Postural dysfunction, Decreased knowledge of precautions  Visit Diagnosis: 1. Muscle weakness (generalized)   2. Stiffness of right shoulder, not elsewhere classified   3. Acute pain of right shoulder   4. Chronic right shoulder pain        Problem List Patient Active Problem List   Diagnosis Date Noted  . GAD (generalized anxiety disorder) 11/15/2018  . Chronic right shoulder pain 08/05/2018  . Abdominal pain 07/10/2018  . Chronic right-sided low back pain with right-sided sciatica 10/10/2017  . Acute recurrent frontal sinusitis 07/03/2017  . Olecranon bursitis of right elbow 07/14/2016  . Migraine 07/14/2016  . Sexual dysfunction 07/14/2016  . History of CVA (cerebrovascular accident) 04/08/2016  . HTN (hypertension), benign 04/08/2016  . Depression 04/08/2016    Anique Beckley,CHRIS, PTA 01/24/2019, 9:08 AM  [facility] [address] [place], [place],  [address] Phone: [phone]   Fax:  [phone]  Name: Rosalyn GessLynn H Sedeno MRN: [id number] Date of Birth: 10/06/1955

## 2019-01-26 ENCOUNTER — Encounter: Payer: Self-pay | Admitting: Physical Therapy

## 2019-01-26 ENCOUNTER — Ambulatory Visit: Payer: 59 | Admitting: Physical Therapy

## 2019-01-26 ENCOUNTER — Other Ambulatory Visit: Payer: Self-pay

## 2019-01-26 DIAGNOSIS — M25611 Stiffness of right shoulder, not elsewhere classified: Secondary | ICD-10-CM

## 2019-01-26 DIAGNOSIS — M6281 Muscle weakness (generalized): Secondary | ICD-10-CM

## 2019-01-26 DIAGNOSIS — M25511 Pain in right shoulder: Secondary | ICD-10-CM

## 2019-01-26 NOTE — Therapy (Addendum)
[facility] [place], [place], [address] Phone: [phone]   Fax:  [phone]  Physical Therapy Treatment  PHYSICAL THERAPY DISCHARGE SUMMARY  Visits from Start of Care: 25  Current functional level related to goals / functional outcomes: See below   Remaining deficits: See goals   Education / Equipment: HEP Plan: Patient agrees to discharge.  Patient goals were partially met. Patient is being discharged due to the patient's request.  ?????    [name], PT, DPT 05/16/19   Patient Details  Name: Tracy Jensen MRN: [id number] Date of Birth: 1955-07-19 Referring Provider (PT): Joni Fears, MD   Encounter Date: 01/26/2019  PT End of Session - 01/26/19 1039    Visit Number  25    Number of Visits  30    Date for PT Re-Evaluation  02/18/19    Authorization Type  FOTO; Progress note every 10th visit    PT Start Time  0815    PT Stop Time  0902    PT Time Calculation (min)  47 min    Activity Tolerance  Patient tolerated treatment well    Behavior During Therapy  [facility] for tasks assessed/performed       Past Medical History:  Diagnosis Date  . Hypertension   . Migraines   . Sciatica   . TIA (transient ischemic attack)     History reviewed. No pertinent surgical history.  There were no vitals filed for this visit.  Subjective Assessment - 01/26/19 0819    Subjective  COVID 19 screening performed on patient upon arrival. no new complaints. DC today.                       [facility] PT Treatment/Exercise - 01/26/19 0001      Shoulder Exercises: Prone   Retraction  Right;20 reps;10 reps    Retraction Weight (lbs)  2#      Shoulder Exercises: Standing   Other Standing Exercises  RW4  yellow band x15 each way    Other Standing Exercises  left bicep curls x20 2#      Shoulder Exercises: Pulleys   Flexion  5 minutes      Shoulder Exercises: ROM/Strengthening   UBE (Upper Arm  Bike)  120 RPM x8 mins       Modalities   Modalities  Vasopneumatic;Electrical Stimulation      Electrical Stimulation   Electrical Stimulation Location  R shoulder    Electrical Stimulation Action  IFC    Electrical Stimulation Parameters  80-150 hz x10 mins    Electrical Stimulation Goals  Pain      Vasopneumatic   Number Minutes Vasopneumatic   10 minutes    Vasopnuematic Location   Shoulder    Vasopneumatic Pressure  Low    Vasopneumatic Temperature   34                  PT Long Term Goals - 01/26/19 0841      PT LONG TERM GOAL #1   Title  Patient will be independent with HEP and its progression.    Time  6    Period  Weeks    Status  Achieved      PT LONG TERM GOAL #2   Title  Patient will demonstrate  145+ degrees of right shoulder flexion AROM to improve ability to perform functional tasks.    Time  6    Period  Weeks  Status  Partially Met      PT LONG TERM GOAL #3   Title  Patient will demonstrate 4/5 or greater right UE strength in all planes to improve ability to perform functional activities.    Time  6    Period  Weeks    Status  Partially Met      PT LONG TERM GOAL #4   Title  Patient will report ability to perform ADLs and caregiving activities for her grandson with right shoulder pain less than or equal to 3/10.    Time  6    Period  Weeks    Status  Achieved      PT LONG TERM GOAL #5   Title  Patient will demonstrate 55+ degrees or greater of right shoulder ER AROM to improve ability to don/doff apparel.    Time  6    Period  Weeks    Status  Achieved            Plan - 01/26/19 1111    Clinical Impression Statement  Patient was able to tolerate treatment well despite ongoing soreness. Patient goals were partially met. Patient and PT discussed continuing HEP for strengthening as well as bringing an ice pack and icing at work during her break. Patient reported understanding. Patient to be discharged today per patient request. FOTO  limitation 37%    Personal Factors and Comorbidities  Comorbidity 1;Time since onset of injury/illness/exacerbation;Profession    Examination-Activity Limitations  Carry;Reach Overhead;Lift;Caring for Others;Dressing;Hygiene/Grooming    Examination-Participation Restrictions  Driving    Stability/Clinical Decision Making  Stable/Uncomplicated    Clinical Decision Making  Low    Rehab Potential  Good    PT Frequency  3x / week    PT Duration  6 weeks    PT Treatment/Interventions  Cryotherapy;ADLs/Self Care Home Management;Electrical Stimulation;Iontophoresis 36m/ml Dexamethasone;Moist Heat;Ultrasound;Therapeutic activities;Therapeutic exercise;Manual techniques;Dry needling;Passive range of motion;Vasopneumatic Device;Taping;Patient/family education    PT Next Visit Plan  DC    Consulted and Agree with Plan of Care  Patient       Patient will benefit from skilled therapeutic intervention in order to improve the following deficits and impairments:  Pain, Impaired UE functional use, Decreased strength, Decreased range of motion, Postural dysfunction, Decreased knowledge of precautions  Visit Diagnosis: 1. Muscle weakness (generalized)   2. Stiffness of right shoulder, not elsewhere classified   3. Acute pain of right shoulder        Problem List Patient Active Problem List   Diagnosis Date Noted  . GAD (generalized anxiety disorder) 11/15/2018  . Chronic right shoulder pain 08/05/2018  . Abdominal pain 07/10/2018  . Chronic right-sided low back pain with right-sided sciatica 10/10/2017  . Acute recurrent frontal sinusitis 07/03/2017  . Olecranon bursitis of right elbow 07/14/2016  . Migraine 07/14/2016  . Sexual dysfunction 07/14/2016  . History of CVA (cerebrovascular accident) 04/08/2016  . HTN (hypertension), benign 04/08/2016  . Depression 04/08/2016   KGabriela Eves PT, DPT 01/26/2019, 12:19 PM  [facility] [address] [address]Phone: [phone]  Fax:  [phone] Name: Tracy LENIGMRN: [id number] of Birth: [birth year]

## 2019-01-28 ENCOUNTER — Encounter: Payer: 59 | Admitting: Physical Therapy

## 2019-02-09 ENCOUNTER — Ambulatory Visit (INDEPENDENT_AMBULATORY_CARE_PROVIDER_SITE_OTHER): Payer: 59 | Admitting: Orthopaedic Surgery

## 2019-02-09 ENCOUNTER — Encounter: Payer: Self-pay | Admitting: Orthopaedic Surgery

## 2019-02-09 ENCOUNTER — Other Ambulatory Visit: Payer: Self-pay

## 2019-02-09 VITALS — BP 100/74 | HR 77 | Ht <= 58 in | Wt 110.0 lb

## 2019-02-09 DIAGNOSIS — M25511 Pain in right shoulder: Secondary | ICD-10-CM

## 2019-02-09 DIAGNOSIS — G8929 Other chronic pain: Secondary | ICD-10-CM

## 2019-02-09 MED ORDER — TRAMADOL HCL 50 MG PO TABS: ORAL_TABLET | ORAL | 0 refills | Status: DC

## 2019-02-09 NOTE — Progress Notes (Signed)
Office Visit Note   Patient: Tracy Jensen           Date of Birth: [birth year]           MRN: [id number] Visit Date: 02/09/2019              Requested by: Terald Sleeper, PA-C [address] [place],  [place] [address] PCP: Terald Sleeper, PA-C   Assessment & Plan: Visit Diagnoses:  1. Chronic right shoulder pain     Plan: 3 months status post rotator cuff tear repair of right shoulder.  Has been back to work several days a week and is noted some increased pain.  She has had some trouble sleeping at night.  Had a full course of therapy and has exercises program for home.  We will try some tramadol for pain at night continue with the exercises and reevaluate in a month.  Follow-Up Instructions: Return in about 1 month (around 03/12/2019).   Orders:  No orders of the defined types were placed in this encounter.  Meds ordered this encounter  Medications  . traMADol (ULTRAM) 50 MG tablet    Sig: 1 tablet at bedtime as needed    Dispense:  30 tablet    Refill:  0      Procedures: No procedures performed   Clinical Data: No additional findings.   Subjective: Chief Complaint  Patient presents with  . Right Shoulder - Follow-up    Right shoulder scope DOS 11/11/2018  Patient presents today for a one month follow up on her right shoulder. She is now 3 months out from a right shoulder scope with rotator cuff tear repair. The surgery was on 11/11/2018. Patient has just returned to work. She has finished outpatient therapy. She has some pain that runs down her bicep. She is continuing to do some exercises.   HPI  Review of Systems   Objective: Vital Signs: BP 100/74   Pulse 77   Ht 4\' 9"  (1.448 m)   Wt 110 lb (49.9 kg)   BMI 23.80 kg/m   Physical Exam  Ortho Exam right shoulder incisions of healed without problem.  Excellent range of motion and was able to place her arm fully overhead without any problems.  Abduction 90 degrees.  Good strength.  Good grip.  No shoulder  crepitation local tenderness over the repair site.  Negative Speed sign Specialty Comments:  No specialty comments available.  Imaging: No results found.   PMFS History: Patient Active Problem List   Diagnosis Date Noted  . GAD (generalized anxiety disorder) 11/15/2018  . Chronic right shoulder pain 08/05/2018  . Abdominal pain 07/10/2018  . Chronic right-sided low back pain with right-sided sciatica 10/10/2017  . Acute recurrent frontal sinusitis 07/03/2017  . Olecranon bursitis of right elbow 07/14/2016  . Migraine 07/14/2016  . Sexual dysfunction 07/14/2016  . History of CVA (cerebrovascular accident) 04/08/2016  . HTN (hypertension), benign 04/08/2016  . Depression 04/08/2016   Past Medical History:  Diagnosis Date  . Hypertension   . Migraines   . Sciatica   . TIA (transient ischemic attack)     History reviewed. No pertinent family history.  History reviewed. No pertinent surgical history. Social History   Occupational History  . Not on file  Tobacco Use  . Smoking status: Never Smoker  . Smokeless tobacco: Never Used  Substance and Sexual Activity  . Alcohol use: Yes    Comment: occ  . Drug use: No  .  Sexual activity: Not on file

## 2019-02-15 ENCOUNTER — Other Ambulatory Visit: Payer: Self-pay | Admitting: Physician Assistant

## 2019-03-18 ENCOUNTER — Other Ambulatory Visit: Payer: Self-pay | Admitting: Physician Assistant

## 2019-03-21 ENCOUNTER — Other Ambulatory Visit: Payer: Self-pay | Admitting: Family

## 2019-04-06 ENCOUNTER — Ambulatory Visit (INDEPENDENT_AMBULATORY_CARE_PROVIDER_SITE_OTHER): Payer: 59 | Admitting: Physician Assistant

## 2019-04-06 ENCOUNTER — Ambulatory Visit: Payer: 59 | Admitting: Orthopaedic Surgery

## 2019-04-06 ENCOUNTER — Encounter: Payer: Self-pay | Admitting: Physician Assistant

## 2019-04-06 ENCOUNTER — Other Ambulatory Visit: Payer: Self-pay

## 2019-04-06 DIAGNOSIS — I10 Essential (primary) hypertension: Secondary | ICD-10-CM | POA: Diagnosis not present

## 2019-04-06 DIAGNOSIS — J0111 Acute recurrent frontal sinusitis: Secondary | ICD-10-CM

## 2019-04-06 MED ORDER — AMOXICILLIN-POT CLAVULANATE 875-125 MG PO TABS: 1.0000 | ORAL_TABLET | Freq: Two times a day (BID) | ORAL | 0 refills | Status: DC

## 2019-04-06 MED ORDER — TRAZODONE HCL 50 MG PO TABS: 100.0000 mg | ORAL_TABLET | Freq: Every day | ORAL | 5 refills | Status: DC

## 2019-04-06 MED ORDER — BENZONATATE 200 MG PO CAPS: 200.0000 mg | ORAL_CAPSULE | Freq: Three times a day (TID) | ORAL | 2 refills | Status: DC | PRN

## 2019-04-06 MED ORDER — PREDNISONE 10 MG (21) PO TBPK: ORAL_TABLET | ORAL | 0 refills | Status: DC

## 2019-04-06 MED ORDER — FLUCONAZOLE 150 MG PO TABS: 150.0000 mg | ORAL_TABLET | Freq: Once | ORAL | 0 refills | Status: AC

## 2019-04-06 MED ORDER — LISINOPRIL 20 MG PO TABS: 20.0000 mg | ORAL_TABLET | Freq: Every day | ORAL | 3 refills | Status: DC

## 2019-04-06 NOTE — Progress Notes (Signed)
Telephone visit  Subjective: SA:[id number] infection PCP: Terald Sleeper, PA-C FUX:NATF Tracy Jensen is a 63 y.o. female calls for telephone consult today. Patient provides verbal consent for consult held via phone.  Patient is identified with 2 separate identifiers.  At this time the entire area is on COVID-19 social distancing and stay home orders are in place.  Patient is of higher risk and therefore we are performing this by a virtual method.  Location of patient: home Location of provider: HOME Others present for call: no  This patient has had many days of sinus headache and postnasal drainage. There is copious drainage at times. Denies any fever at this time. There has been a history of sinus infections in the past.  No history of sinus surgery. There is cough at night. It has become more prevalent in recent days.  Needs a refill on her lisinopril and trazodone.  She states she has had very good blood pressure readings at home.  She also is having good sleep with trazodone.  She does have exposure 1 day a week, she still works at [employer] at that time.  She does help take care of her grandchild.  ROS: Per HPI  Allergies  Allergen Reactions  . Azithromycin Hives    Hives after Zpack   Past Medical History:  Diagnosis Date  . Hypertension   . Migraines   . Sciatica   . TIA (transient ischemic attack)     Current Outpatient Medications:  .  aspirin EC 81 MG tablet, Take 81 mg by mouth daily., Disp: , Rfl:  .  busPIRone (BUSPAR) 15 MG tablet, Take 1 tablet (15 mg total) by mouth 3 (three) times daily. Needs to be seen, Disp: 90 tablet, Rfl: 11 .  butalbital-acetaminophen-caffeine (FIORICET, ESGIC) 50-325-40 MG tablet, TAKE ONE TABLET BY MOUTH EVERY 8 HOURS. (Patient taking differently: Take 1 tablet by mouth every 8 (eight) hours. ), Disp: 10 tablet, Rfl: 5 .  cyclobenzaprine (FLEXERIL) 10 MG tablet, TAKE 1 TABLET BY MOUTH THREE TIMES DAILY, Disp: 90 tablet, Rfl: 0 .  diclofenac  (VOLTAREN) 75 MG EC tablet, Take 1 tablet (75 mg total) by mouth 2 (two) times daily., Disp: 180 tablet, Rfl: 1 .  fluticasone (FLONASE) 50 MCG/ACT nasal spray, Place 2 sprays into both nostrils daily., Disp: 16 g, Rfl: 6 .  HYDROcodone-acetaminophen (NORCO/VICODIN) 5-325 MG tablet, Take 1 tablet by mouth every 4 (four) hours as needed for moderate pain., Disp: 40 tablet, Rfl: 0 .  lisinopril (PRINIVIL,ZESTRIL) 20 MG tablet, TAKE 1 TABLET BY MOUTH ONCE DAILY (Patient taking differently: Take 20 mg by mouth daily. ), Disp: 90 tablet, Rfl: 3 .  methocarbamol (ROBAXIN) 500 MG tablet, Take 1 tablet (500 mg total) by mouth every 8 (eight) hours as needed for muscle spasms., Disp: 90 tablet, Rfl: 5 .  polyethylene glycol (MIRALAX / GLYCOLAX) packet, Take 17 g by mouth daily as needed for mild constipation or moderate constipation., Disp: 14 each, Rfl: 0 .  traMADol (ULTRAM) 50 MG tablet, 1 tablet at bedtime as needed, Disp: 30 tablet, Rfl: 0 .  traZODone (DESYREL) 50 MG tablet, TAKE 2 TABLETS BY MOUTH AT BEDTIME, Disp: 60 tablet, Rfl: 0  Assessment/ Plan: 63 y.o. female   1. Acute recurrent frontal sinusitis - amoxicillin-clavulanate (AUGMENTIN) 875-125 MG tablet; Take 1 tablet by mouth 2 (two) times daily.  Dispense: 20 tablet; Refill: 0 - predniSONE (STERAPRED UNI-PAK 21 TAB) 10 MG (21) TBPK tablet; As directed x 6 days  Dispense: 21 tablet; Refill: 0 - benzonatate (TESSALON) 200 MG capsule; Take 1 capsule (200 mg total) by mouth 3 (three) times daily as needed for cough.  Dispense: 60 capsule; Refill: 2 - fluconazole (DIFLUCAN) 150 MG tablet; Take 1 tablet (150 mg total) by mouth once for 1 dose.  Dispense: 1 tablet; Refill: 0  2. HTN (hypertension), benign - lisinopril (ZESTRIL) 20 MG tablet; Take 1 tablet (20 mg total) by mouth daily.  Dispense: 90 tablet; Refill: 3   No follow-ups on file.  Continue all other maintenance medications as listed above.  Start time: 4:10 AM End time: 4:21 AM   No orders of the defined types were placed in this encounter.   Prudy Feeler PA-C [facility] [phone]

## 2019-05-04 ENCOUNTER — Encounter: Payer: Self-pay | Admitting: Orthopaedic Surgery

## 2019-05-04 ENCOUNTER — Ambulatory Visit (INDEPENDENT_AMBULATORY_CARE_PROVIDER_SITE_OTHER): Payer: 59 | Admitting: Orthopaedic Surgery

## 2019-05-04 VITALS — Ht <= 58 in | Wt 110.0 lb

## 2019-05-04 DIAGNOSIS — G8929 Other chronic pain: Secondary | ICD-10-CM | POA: Diagnosis not present

## 2019-05-04 DIAGNOSIS — M25511 Pain in right shoulder: Secondary | ICD-10-CM | POA: Diagnosis not present

## 2019-05-04 MED ORDER — TRAMADOL HCL 50 MG PO TABS: ORAL_TABLET | ORAL | 0 refills | Status: DC

## 2019-05-04 NOTE — Progress Notes (Signed)
Office Visit Note   Patient: Tracy Jensen           Date of Birth: 1956-06-01           MRN: [id number] Visit Date: 05/04/2019              Requested by: Terald Sleeper, PA-C [address] [place],  [place] [address] PCP: Terald Sleeper, PA-C   Assessment & Plan: Visit Diagnoses:  1. Chronic right shoulder pain     Plan: Early 6 months status post rotator cuff tear repair of right shoulder.  Still having some difficulty with overhead motion.  She does a lot of repetitive activity at work with her arm over her head.  Occasionally will have some trouble sleeping.  No pain with her arm at her side.  Tracy Jensen is considering retiring from her job.  I think this will make a difference in terms of her repetitive overhead activity I had like to see her back in a month or 2 after she has had an opportunity to rest her shoulder.  We may consider repeat MRI scan or subacromial cortisone injection.  I am most concerned that she may have a recurrent rotator cuff tear.  She also had a biceps tenodesis.  That does not appear to be a problem  Follow-Up Instructions: Return in about 3 months (around 08/04/2019).   Orders:  No orders of the defined types were placed in this encounter.  Meds ordered this encounter  Medications  . traMADol (ULTRAM) 50 MG tablet    Sig: 1 tablet by mouth at bedtime.    Dispense:  30 tablet    Refill:  0      Procedures: No procedures performed   Clinical Data: No additional findings.   Subjective: Chief Complaint  Patient presents with  . Right Shoulder - Follow-up  Patient presents today for follow up on her right shoulder. She had a right shoulder arthroscopy on 11/11/2018. She said that she continues to hurt anteriorly. The pain is worse after walking an 8 hour shift, and sometimes has a difficult time sleeping.  She takes hydrocodone if needed. She is right hand dominant.   HPI  Review of Systems   Objective: Vital Signs: Ht 4\' 9"  (1.448 m)   Wt 110  lb (49.9 kg)   BMI 23.80 kg/m   Physical Exam  Ortho Exam wake alert and oriented x3.  Comfortable sitting.  Able to place her right arm overhead without much trouble.  She had good strength.  The biceps did not hurt but was in an inferior position consistent with her tenodesis.  No impingement.  Negative empty can testing.  No pain over the [facility] joint.  Skin intact  Specialty Comments:  No specialty comments available.  Imaging: No results found.   PMFS History: Patient Active Problem List   Diagnosis Date Noted  . GAD (generalized anxiety disorder) 11/15/2018  . Chronic right shoulder pain 08/05/2018  . Abdominal pain 07/10/2018  . Chronic right-sided low back pain with right-sided sciatica 10/10/2017  . Acute recurrent frontal sinusitis 07/03/2017  . Olecranon bursitis of right elbow 07/14/2016  . Migraine 07/14/2016  . Sexual dysfunction 07/14/2016  . History of CVA (cerebrovascular accident) 04/08/2016  . HTN (hypertension), benign 04/08/2016  . Depression 04/08/2016   Past Medical History:  Diagnosis Date  . Hypertension   . Migraines   . Sciatica   . TIA (transient ischemic attack)     History  reviewed. No pertinent family history.  History reviewed. No pertinent surgical history. Social History   Occupational History  . Not on file  Tobacco Use  . Smoking status: Never Smoker  . Smokeless tobacco: Never Used  Substance and Sexual Activity  . Alcohol use: Yes    Comment: occ  . Drug use: No  . Sexual activity: Not on file

## 2019-06-20 ENCOUNTER — Other Ambulatory Visit: Payer: Self-pay | Admitting: Physician Assistant

## 2019-06-20 DIAGNOSIS — G43809 Other migraine, not intractable, without status migrainosus: Secondary | ICD-10-CM

## 2019-08-06 ENCOUNTER — Other Ambulatory Visit: Payer: Self-pay | Admitting: Physician Assistant

## 2019-08-23 ENCOUNTER — Other Ambulatory Visit: Payer: Self-pay | Admitting: Physician Assistant

## 2019-08-23 DIAGNOSIS — G8929 Other chronic pain: Secondary | ICD-10-CM

## 2019-09-09 ENCOUNTER — Encounter: Payer: Self-pay | Admitting: Family

## 2019-09-09 ENCOUNTER — Ambulatory Visit (INDEPENDENT_AMBULATORY_CARE_PROVIDER_SITE_OTHER): Payer: 59 | Admitting: Family

## 2019-09-09 DIAGNOSIS — Z8673 Personal history of transient ischemic attack (TIA), and cerebral infarction without residual deficits: Secondary | ICD-10-CM

## 2019-09-09 DIAGNOSIS — M19011 Primary osteoarthritis, right shoulder: Secondary | ICD-10-CM

## 2019-09-09 DIAGNOSIS — I10 Essential (primary) hypertension: Secondary | ICD-10-CM

## 2019-09-09 DIAGNOSIS — G43809 Other migraine, not intractable, without status migrainosus: Secondary | ICD-10-CM | POA: Diagnosis not present

## 2019-09-09 DIAGNOSIS — F320 Major depressive disorder, single episode, mild: Secondary | ICD-10-CM

## 2019-09-09 DIAGNOSIS — M19012 Primary osteoarthritis, left shoulder: Secondary | ICD-10-CM

## 2019-09-09 DIAGNOSIS — K5900 Constipation, unspecified: Secondary | ICD-10-CM | POA: Diagnosis not present

## 2019-09-09 DIAGNOSIS — G4700 Insomnia, unspecified: Secondary | ICD-10-CM | POA: Insufficient documentation

## 2019-09-09 DIAGNOSIS — F411 Generalized anxiety disorder: Secondary | ICD-10-CM

## 2019-09-09 NOTE — Progress Notes (Signed)
Virtual Visit via telephone Note Due to COVID-19 pandemic this visit was conducted virtually. This visit type was conducted due to national recommendations for restrictions regarding the COVID-19 Pandemic (e.g. social distancing, sheltering in place) in an effort to limit this patient's exposure and mitigate transmission in our community. All issues noted in this document were discussed and addressed.  A physical exam was not performed with this format.  I connected with Tracy Jensen on 09/09/19 at 11:17 AM by telephone and verified that I am speaking with the correct person using two identifiers. Tracy Jensen is currently located at home and no one is currently with her during visit. The provider, Evelina Dun, FNP is located in their office at time of visit.  I discussed the limitations, risks, security and privacy concerns of performing an evaluation and management service by telephone and the availability of in person appointments. I also discussed with the patient that there may be a patient responsible charge related to this service. The patient expressed understanding and agreed to proceed.   History and Present Illness:  PT calls the office today for chronic follow up.  Hypertension This is a chronic problem. The current episode started more than 1 year ago. The problem has been resolved since onset. The problem is controlled. Associated symptoms include malaise/fatigue. Pertinent negatives include no peripheral edema or shortness of breath. Risk factors for coronary artery disease include obesity and sedentary lifestyle. The current treatment provides moderate improvement. There is no history of kidney disease or CAD/MI.  Constipation This is a chronic problem. The current episode started more than 1 year ago. Her stool frequency is 1 time per day. Pertinent negatives include no nausea or vomiting. She has tried laxatives for the symptoms. The treatment provided moderate relief.    Arthritis Presents for follow-up visit. She complains of pain and joint warmth. The symptoms have been stable. Affected locations include the left shoulder and right shoulder.  Migraine  This is a chronic problem. The current episode started more than 1 year ago. The problem occurs intermittently (2 a month). The pain is located in the right unilateral region. The pain quality is similar to prior headaches. Associated symptoms include insomnia. Pertinent negatives include no nausea, phonophobia, photophobia or vomiting. Her past medical history is significant for hypertension.  Insomnia Primary symptoms: difficulty falling asleep, frequent awakening, malaise/fatigue.  The current episode started more than one year. The onset quality is gradual. The problem occurs intermittently. The treatment provided moderate relief.      Review of Systems  Constitutional: Positive for malaise/fatigue.  Eyes: Negative for photophobia.  Respiratory: Negative for shortness of breath.   Gastrointestinal: Positive for constipation. Negative for nausea and vomiting.  Musculoskeletal: Positive for arthritis.  Psychiatric/Behavioral: The patient has insomnia.      Observations/Objective: No SOB or distress noted   Assessment and Plan: Tracy Jensen comes in today with chief complaint of No chief complaint on file.   Diagnosis and orders addressed:  1. HTN (hypertension), benign  2. Constipation, unspecified constipation type  3. Insomnia, unspecified type  4. Other migraine without status migrainosus, not intractable  5. Current mild episode of major depressive disorder without prior episode ([place])  6. GAD (generalized anxiety disorder)  7. History of CVA (cerebrovascular accident)  8. Primary osteoarthritis of both shoulders   Pt is coming next month for CPE with pap and will lab work at that time.  Health Maintenance reviewed Diet and exercise encouraged  Follow  up plan: 1-2 months for  PAP      I discussed the assessment and treatment plan with the patient. The patient was provided an opportunity to ask questions and all were answered. The patient agreed with the plan and demonstrated an understanding of the instructions.   The patient was advised to call back or seek an in-person evaluation if the symptoms worsen or if the condition fails to improve as anticipated.  The above assessment and management plan was discussed with the patient. The patient verbalized understanding of and has agreed to the management plan. Patient is aware to call the clinic if symptoms persist or worsen. Patient is aware when to return to the clinic for a follow-up visit. Patient educated on when it is appropriate to go to the emergency department.   Time call ended: 11:37 AM   I provided 20 minutes of non-face-to-face time during this encounter.    Jannifer Rodney, FNP

## 2019-09-20 ENCOUNTER — Other Ambulatory Visit: Payer: Self-pay | Admitting: Family

## 2019-09-20 ENCOUNTER — Other Ambulatory Visit: Payer: Self-pay

## 2019-09-20 ENCOUNTER — Other Ambulatory Visit: Payer: 59

## 2019-09-20 DIAGNOSIS — G43809 Other migraine, not intractable, without status migrainosus: Secondary | ICD-10-CM

## 2019-09-20 DIAGNOSIS — F411 Generalized anxiety disorder: Secondary | ICD-10-CM

## 2019-09-20 DIAGNOSIS — Z8673 Personal history of transient ischemic attack (TIA), and cerebral infarction without residual deficits: Secondary | ICD-10-CM

## 2019-09-20 DIAGNOSIS — F320 Major depressive disorder, single episode, mild: Secondary | ICD-10-CM

## 2019-09-20 DIAGNOSIS — I10 Essential (primary) hypertension: Secondary | ICD-10-CM

## 2019-09-20 LAB — CBC WITH DIFFERENTIAL/PLATELET
Basophils Absolute: 0.1 10*3/uL (ref 0.0–0.2)
Basos: 1 %
EOS (ABSOLUTE): 0.3 10*3/uL (ref 0.0–0.4)
Eos: 5 %
Hematocrit: 32.9 % — ABNORMAL LOW (ref 34.0–46.6)
Hemoglobin: 10.3 g/dL — ABNORMAL LOW (ref 11.1–15.9)
Immature Grans (Abs): 0 10*3/uL (ref 0.0–0.1)
Immature Granulocytes: 0 %
Lymphocytes Absolute: 1.7 10*3/uL (ref 0.7–3.1)
Lymphs: 24 %
MCH: 27.5 pg (ref 26.6–33.0)
MCHC: 31.3 g/dL — ABNORMAL LOW (ref 31.5–35.7)
MCV: 88 fL (ref 79–97)
Monocytes Absolute: 0.7 10*3/uL (ref 0.1–0.9)
Monocytes: 10 %
Neutrophils Absolute: 4.2 10*3/uL (ref 1.4–7.0)
Neutrophils: 60 %
Platelets: 329 10*3/uL (ref 150–450)
RBC: 3.74 x10E6/uL — ABNORMAL LOW (ref 3.77–5.28)
RDW: 14.1 % (ref 11.7–15.4)
WBC: 7 10*3/uL (ref 3.4–10.8)

## 2019-09-20 LAB — LIPID PANEL
Chol/HDL Ratio: 2 ratio (ref 0.0–4.4)
Cholesterol, Total: 206 mg/dL — ABNORMAL HIGH (ref 100–199)
HDL: 101 mg/dL (ref >39)
LDL Chol Calc (NIH): 91 mg/dL (ref 0–99)
Triglycerides: 78 mg/dL (ref 0–149)
VLDL Cholesterol Cal: 14 mg/dL (ref 5–40)

## 2019-09-20 LAB — CMP14+EGFR
ALT: 10 IU/L (ref 0–32)
AST: 15 IU/L (ref 0–40)
Albumin/Globulin Ratio: 1.9 (ref 1.2–2.2)
Albumin: 3.9 g/dL (ref 3.8–4.8)
Alkaline Phosphatase: 92 IU/L (ref 39–117)
BUN/Creatinine Ratio: 26 (ref 12–28)
BUN: 20 mg/dL (ref 8–27)
Bilirubin Total: <0.2 mg/dL (ref 0.0–1.2)
CO2: 25 mmol/L (ref 20–29)
Calcium: 9.2 mg/dL (ref 8.7–10.3)
Chloride: 105 mmol/L (ref 96–106)
Creatinine, Ser: 0.76 mg/dL (ref 0.57–1.00)
GFR calc Af Amer: 96 mL/min/1.73 (ref >59)
GFR calc non Af Amer: 83 mL/min/1.73 (ref >59)
Globulin, Total: 2.1 g/dL (ref 1.5–4.5)
Glucose: 90 mg/dL (ref 65–99)
Potassium: 4.7 mmol/L (ref 3.5–5.2)
Sodium: 141 mmol/L (ref 134–144)
Total Protein: 6 g/dL (ref 6.0–8.5)

## 2019-09-26 ENCOUNTER — Telehealth: Payer: Self-pay | Admitting: Family

## 2019-09-26 NOTE — Telephone Encounter (Signed)
Aware of lab results  

## 2019-09-28 ENCOUNTER — Other Ambulatory Visit: Payer: 59

## 2019-09-28 ENCOUNTER — Other Ambulatory Visit: Payer: Self-pay

## 2019-09-28 DIAGNOSIS — K5900 Constipation, unspecified: Secondary | ICD-10-CM

## 2019-09-29 LAB — FECAL OCCULT BLOOD, IMMUNOCHEMICAL: Fecal Occult Bld: POSITIVE — AB

## 2019-10-03 ENCOUNTER — Encounter: Payer: Self-pay | Admitting: Family

## 2019-10-03 ENCOUNTER — Ambulatory Visit (INDEPENDENT_AMBULATORY_CARE_PROVIDER_SITE_OTHER): Payer: 59 | Admitting: Family

## 2019-10-03 ENCOUNTER — Other Ambulatory Visit: Payer: Self-pay

## 2019-10-03 VITALS — BP 125/75 | HR 76 | Temp 97.6°F | Ht <= 58 in | Wt 110.2 lb

## 2019-10-03 DIAGNOSIS — F411 Generalized anxiety disorder: Secondary | ICD-10-CM

## 2019-10-03 DIAGNOSIS — Z01419 Encounter for gynecological examination (general) (routine) without abnormal findings: Secondary | ICD-10-CM

## 2019-10-03 DIAGNOSIS — F320 Major depressive disorder, single episode, mild: Secondary | ICD-10-CM

## 2019-10-03 DIAGNOSIS — K5900 Constipation, unspecified: Secondary | ICD-10-CM

## 2019-10-03 DIAGNOSIS — G4700 Insomnia, unspecified: Secondary | ICD-10-CM | POA: Diagnosis not present

## 2019-10-03 DIAGNOSIS — Z8673 Personal history of transient ischemic attack (TIA), and cerebral infarction without residual deficits: Secondary | ICD-10-CM

## 2019-10-03 DIAGNOSIS — Z1159 Encounter for screening for other viral diseases: Secondary | ICD-10-CM

## 2019-10-03 DIAGNOSIS — Z0001 Encounter for general adult medical examination with abnormal findings: Secondary | ICD-10-CM

## 2019-10-03 DIAGNOSIS — I10 Essential (primary) hypertension: Secondary | ICD-10-CM

## 2019-10-03 DIAGNOSIS — Z0000 Encounter for general adult medical examination without abnormal findings: Secondary | ICD-10-CM

## 2019-10-03 NOTE — Patient Instructions (Signed)
Health Maintenance, Female Adopting a healthy lifestyle and getting preventive care are important in promoting health and wellness. Ask your health care provider about:  The right schedule for you to have regular tests and exams.  Things you can do on your own to prevent diseases and keep yourself healthy. What should I know about diet, weight, and exercise? Eat a healthy diet   Eat a diet that includes plenty of vegetables, fruits, low-fat dairy products, and lean protein.  Do not eat a lot of foods that are high in solid fats, added sugars, or sodium. Maintain a healthy weight Body mass index (BMI) is used to identify weight problems. It estimates body fat based on height and weight. Your health care provider can help determine your BMI and help you achieve or maintain a healthy weight. Get regular exercise Get regular exercise. This is one of the most important things you can do for your health. Most adults should:  Exercise for at least 150 minutes each week. The exercise should increase your heart rate and make you sweat (moderate-intensity exercise).  Do strengthening exercises at least twice a week. This is in addition to the moderate-intensity exercise.  Spend less time sitting. Even light physical activity can be beneficial. Watch cholesterol and blood lipids Have your blood tested for lipids and cholesterol at 65 years of age, then have this test every 5 years. Have your cholesterol levels checked more often if:  Your lipid or cholesterol levels are high.  You are older than 65 years of age.  You are at high risk for heart disease. What should I know about cancer screening? Depending on your health history and family history, you may need to have cancer screening at various ages. This may include screening for:  Breast cancer.  Cervical cancer.  Colorectal cancer.  Skin cancer.  Lung cancer. What should I know about heart disease, diabetes, and high blood  pressure? Blood pressure and heart disease  High blood pressure causes heart disease and increases the risk of stroke. This is more likely to develop in people who have high blood pressure readings, are of African descent, or are overweight.  Have your blood pressure checked: ? Every 3-5 years if you are 18-39 years of age. ? Every year if you are 40 years old or older. Diabetes Have regular diabetes screenings. This checks your fasting blood sugar level. Have the screening done:  Once every three years after age 40 if you are at a normal weight and have a low risk for diabetes.  More often and at a younger age if you are overweight or have a high risk for diabetes. What should I know about preventing infection? Hepatitis B If you have a higher risk for hepatitis B, you should be screened for this virus. Talk with your health care provider to find out if you are at risk for hepatitis B infection. Hepatitis C Testing is recommended for:  Everyone born from 1945 through 1965.  Anyone with known risk factors for hepatitis C. Sexually transmitted infections (STIs)  Get screened for STIs, including gonorrhea and chlamydia, if: ? You are sexually active and are younger than 65 years of age. ? You are older than 65 years of age and your health care provider tells you that you are at risk for this type of infection. ? Your sexual activity has changed since you were last screened, and you are at increased risk for chlamydia or gonorrhea. Ask your health care provider if   you are at risk.  Ask your health care provider about whether you are at high risk for HIV. Your health care provider may recommend a prescription medicine to help prevent HIV infection. If you choose to take medicine to prevent HIV, you should first get tested for HIV. You should then be tested every 3 months for as long as you are taking the medicine. Pregnancy  If you are about to stop having your period (premenopausal) and  you may become pregnant, seek counseling before you get pregnant.  Take 400 to 800 micrograms (mcg) of folic acid every day if you become pregnant.  Ask for birth control (contraception) if you want to prevent pregnancy. Osteoporosis and menopause Osteoporosis is a disease in which the bones lose minerals and strength with aging. This can result in bone fractures. If you are 65 years old or older, or if you are at risk for osteoporosis and fractures, ask your health care provider if you should:  Be screened for bone loss.  Take a calcium or vitamin D supplement to lower your risk of fractures.  Be given hormone replacement therapy (HRT) to treat symptoms of menopause. Follow these instructions at home: Lifestyle  Do not use any products that contain nicotine or tobacco, such as cigarettes, e-cigarettes, and chewing tobacco. If you need help quitting, ask your health care provider.  Do not use street drugs.  Do not share needles.  Ask your health care provider for help if you need support or information about quitting drugs. Alcohol use  Do not drink alcohol if: ? Your health care provider tells you not to drink. ? You are pregnant, may be pregnant, or are planning to become pregnant.  If you drink alcohol: ? Limit how much you use to 0-1 drink a day. ? Limit intake if you are breastfeeding.  Be aware of how much alcohol is in your drink. In the [place] one drink equals one 12 oz bottle of beer (355 mL), one 5 oz glass of wine (148 mL), or one 1 oz glass of hard liquor (44 mL). General instructions  Schedule regular health, dental, and eye exams.  Stay current with your vaccines.  Tell your health care provider if: ? You often feel depressed. ? You have ever been abused or do not feel safe at home. Summary  Adopting a healthy lifestyle and getting preventive care are important in promoting health and wellness.  Follow your health care provider's instructions about healthy  diet, exercising, and getting tested or screened for diseases.  Follow your health care provider's instructions on monitoring your cholesterol and blood pressure. This information is not intended to replace advice given to you by your health care provider. Make sure you discuss any questions you have with your health care provider. Document Revised: 05/26/2018 Document Reviewed: 05/26/2018 Elsevier Patient Education  2020 [employer]  

## 2019-10-03 NOTE — Progress Notes (Signed)
Subjective:    Patient ID: Tracy Jensen, female    DOB: [birth year], 64 y.o.   MRN: [id number]  Chief Complaint  Patient presents with  . Annual Exam    with pap, Wants palsy added to het list from 2017    Pt presents to the office today for CPE with pap.  Hypertension This is a chronic problem. The current episode started more than 1 year ago. The problem has been resolved since onset. The problem is controlled. Associated symptoms include anxiety. Pertinent negatives include no malaise/fatigue, peripheral edema or shortness of breath. Risk factors for coronary artery disease include dyslipidemia. The current treatment provides moderate improvement. There is no history of kidney disease or CAD/MI.  Insomnia Primary symptoms: difficulty falling asleep, frequent awakening, no malaise/fatigue.  The current episode started more than one year. The onset quality is gradual. The problem occurs intermittently. The problem has been waxing and waning since onset. PMH includes: depression.  Migraine  This is a chronic problem. The current episode started more than 1 year ago. The problem occurs intermittently (twice a month). The problem has been gradually improving. The pain is located in the right unilateral region. The pain does not radiate. The pain quality is similar to prior headaches. The quality of the pain is described as aching. Associated symptoms include insomnia, nausea, phonophobia and photophobia. The symptoms are aggravated by emotional stress. She has tried Excedrin for the symptoms. The treatment provided moderate relief. Her past medical history is significant for hypertension.  Anxiety Presents for follow-up visit. Symptoms include depressed mood, excessive worry, insomnia, nausea, nervous/anxious behavior and restlessness. Patient reports no shortness of breath.    Depression        This is a chronic problem.  The current episode started more than 1 year ago.   The onset quality is  gradual.   The problem occurs intermittently.  Associated symptoms include insomnia, irritable, restlessness and decreased interest.  Past treatments include SSRIs - Selective serotonin reuptake inhibitors.  Past medical history includes anxiety.       Review of Systems  Constitutional: Negative for malaise/fatigue.  Eyes: Positive for photophobia.  Respiratory: Negative for shortness of breath.   Gastrointestinal: Positive for nausea.  Psychiatric/Behavioral: Positive for depression. The patient is nervous/anxious and has insomnia.   All other systems reviewed and are negative.  History reviewed. No pertinent family history.  Social History   Socioeconomic History  . Marital status: Married    Spouse name: Not on file  . Number of children: Not on file  . Years of education: Not on file  . Highest education level: Not on file  Occupational History  . Not on file  Tobacco Use  . Smoking status: Never Smoker  . Smokeless tobacco: Never Used  Substance and Sexual Activity  . Alcohol use: Yes    Comment: occ  . Drug use: No  . Sexual activity: Not on file  Other Topics Concern  . Not on file  Social History Narrative  . Not on file   Social Determinants of Health   Financial Resource Strain:   . Difficulty of Paying Living Expenses:   Food Insecurity:   . Worried About Charity fundraiser in the Last Year:   . Arboriculturist in the Last Year:   Transportation Needs:   . Film/video editor (Medical):   Marland Kitchen Lack of Transportation (Non-Medical):   Physical Activity:   . Days of Exercise per Week:   .  Minutes of Exercise per Session:   Stress:   . Feeling of Stress :   Social Connections:   . Frequency of Communication with Friends and Family:   . Frequency of Social Gatherings with Friends and Family:   . Attends Religious Services:   . Active Member of Clubs or Organizations:   . Attends Archivist Meetings:   Marland Kitchen Marital Status:        Objective:     Physical Exam Vitals reviewed.  Constitutional:      General: She is irritable. She is not in acute distress.    Appearance: She is well-developed.  HENT:     Head: Normocephalic and atraumatic.     Right Ear: Tympanic membrane normal.     Left Ear: Tympanic membrane normal.  Eyes:     Pupils: Pupils are equal, round, and reactive to light.  Neck:     Thyroid: No thyromegaly.  Cardiovascular:     Rate and Rhythm: Normal rate and regular rhythm.     Heart sounds: Normal heart sounds. No murmur.  Pulmonary:     Effort: Pulmonary effort is normal. No respiratory distress.     Breath sounds: Normal breath sounds. No wheezing.  Chest:     Breasts:        Right: No swelling, bleeding, inverted nipple, mass, nipple discharge, skin change or tenderness.        Left: No swelling, bleeding, inverted nipple, mass, nipple discharge, skin change or tenderness.  Abdominal:     General: Bowel sounds are normal. There is no distension.     Palpations: Abdomen is soft.     Tenderness: There is no abdominal tenderness.  Genitourinary:    General: Normal vulva.     Comments: Bimanual exam- no adnexal masses or tenderness, ovaries nonpalpable   Cervix parous and pink- No discharge  Musculoskeletal:        General: No tenderness. Normal range of motion.     Cervical back: Normal range of motion and neck supple.  Skin:    General: Skin is warm and dry.  Neurological:     Mental Status: She is alert and oriented to person, place, and time.     Cranial Nerves: No cranial nerve deficit.     Deep Tendon Reflexes: Reflexes are normal and symmetric.  Psychiatric:        Behavior: Behavior normal.        Thought Content: Thought content normal.        Judgment: Judgment normal.       BP 125/75   Pulse 76   Temp 97.6 F (36.4 C) (Temporal)   Ht 4' 9"  (1.448 m)   Wt 110 lb 3.2 oz (50 kg)   SpO2 100%   BMI 23.85 kg/m      Assessment & Plan:  Tracy Jensen comes in today with chief  complaint of Annual Exam (with pap, Wants palsy added to het list from 2017 )   Diagnosis and orders addressed:  1. HTN (hypertension), benign - CMP14+EGFR - CBC with Differential/Platelet  2. Insomnia, unspecified type - CMP14+EGFR - CBC with Differential/Platelet  3. History of CVA (cerebrovascular accident) - CMP14+EGFR - CBC with Differential/Platelet  4. GAD (generalized anxiety disorder) - CMP14+EGFR - CBC with Differential/Platelet  5. Current mild episode of major depressive disorder without prior episode (HCC) - CMP14+EGFR - CBC with Differential/Platelet  6. Constipation, unspecified constipation type - CMP14+EGFR - CBC with Differential/Platelet  7. Annual physical  exam - CMP14+EGFR - CBC with Differential/Platelet - Lipid panel - TSH - IGP, Aptima HPV, rfx 16/18,45 - Hepatitis C antibody  8. Encounter for gynecological examination without abnormal finding - CMP14+EGFR - CBC with Differential/Platelet - IGP, Aptima HPV, rfx 16/18,45  9. Need for hepatitis C screening test - Hepatitis C antibody   Labs pending Health Maintenance reviewed Diet and exercise encouraged  Follow up plan: 6 months    Evelina Dun, FNP

## 2019-10-04 ENCOUNTER — Other Ambulatory Visit: Payer: Self-pay | Admitting: Family

## 2019-10-04 DIAGNOSIS — R195 Other fecal abnormalities: Secondary | ICD-10-CM

## 2019-10-04 LAB — CMP14+EGFR
ALT: 15 IU/L (ref 0–32)
AST: 16 IU/L (ref 0–40)
Albumin/Globulin Ratio: 1.9 (ref 1.2–2.2)
Albumin: 4.4 g/dL (ref 3.8–4.8)
Alkaline Phosphatase: 101 IU/L (ref 39–117)
BUN/Creatinine Ratio: 37 — ABNORMAL HIGH (ref 12–28)
BUN: 25 mg/dL (ref 8–27)
Bilirubin Total: <0.2 mg/dL (ref 0.0–1.2)
CO2: 24 mmol/L (ref 20–29)
Calcium: 9 mg/dL (ref 8.7–10.3)
Chloride: 103 mmol/L (ref 96–106)
Creatinine, Ser: 0.67 mg/dL (ref 0.57–1.00)
GFR calc Af Amer: 107 mL/min/{1.73_m2} (ref >59)
GFR calc non Af Amer: 93 mL/min/{1.73_m2} (ref >59)
Globulin, Total: 2.3 g/dL (ref 1.5–4.5)
Glucose: 90 mg/dL (ref 65–99)
Potassium: 5 mmol/L (ref 3.5–5.2)
Sodium: 141 mmol/L (ref 134–144)
Total Protein: 6.7 g/dL (ref 6.0–8.5)

## 2019-10-04 LAB — LIPID PANEL
Chol/HDL Ratio: 2.1 ratio (ref 0.0–4.4)
Cholesterol, Total: 217 mg/dL — ABNORMAL HIGH (ref 100–199)
HDL: 105 mg/dL (ref >39)
LDL Chol Calc (NIH): 95 mg/dL (ref 0–99)
Triglycerides: 98 mg/dL (ref 0–149)
VLDL Cholesterol Cal: 17 mg/dL (ref 5–40)

## 2019-10-04 LAB — CBC WITH DIFFERENTIAL/PLATELET
Basophils Absolute: 0.1 10*3/uL (ref 0.0–0.2)
Basos: 1 %
EOS (ABSOLUTE): 0.3 10*3/uL (ref 0.0–0.4)
Eos: 4 %
Hematocrit: 33.4 % — ABNORMAL LOW (ref 34.0–46.6)
Hemoglobin: 10.8 g/dL — ABNORMAL LOW (ref 11.1–15.9)
Immature Grans (Abs): 0 10*3/uL (ref 0.0–0.1)
Immature Granulocytes: 0 %
Lymphocytes Absolute: 1.8 10*3/uL (ref 0.7–3.1)
Lymphs: 25 %
MCH: 27.6 pg (ref 26.6–33.0)
MCHC: 32.3 g/dL (ref 31.5–35.7)
MCV: 85 fL (ref 79–97)
Monocytes Absolute: 0.5 10*3/uL (ref 0.1–0.9)
Monocytes: 7 %
Neutrophils Absolute: 4.7 10*3/uL (ref 1.4–7.0)
Neutrophils: 63 %
Platelets: 355 10*3/uL (ref 150–450)
RBC: 3.92 x10E6/uL (ref 3.77–5.28)
RDW: 13.7 % (ref 11.7–15.4)
WBC: 7.4 10*3/uL (ref 3.4–10.8)

## 2019-10-04 LAB — TSH: TSH: 0.998 u[IU]/mL (ref 0.450–4.500)

## 2019-10-04 LAB — HEPATITIS C ANTIBODY: Hep C Virus Ab: <0.1 s/co ratio (ref 0.0–0.9)

## 2019-10-05 LAB — IGP, APTIMA HPV, RFX 16/18,45: HPV Aptima: NEGATIVE

## 2019-10-06 ENCOUNTER — Encounter: Payer: Self-pay | Admitting: Internal Medicine

## 2019-10-10 ENCOUNTER — Other Ambulatory Visit: Payer: Self-pay | Admitting: *Deleted

## 2019-10-13 ENCOUNTER — Other Ambulatory Visit: Payer: Self-pay | Admitting: Family

## 2019-10-13 MED ORDER — TRAZODONE HCL 50 MG PO TABS: 100.0000 mg | ORAL_TABLET | Freq: Every day | ORAL | 5 refills | Status: DC

## 2019-10-13 NOTE — Telephone Encounter (Signed)
  Prescription Request  10/13/2019  What is the name of the medication or equipment? Trazadone  Have you contacted your pharmacy to request a refill? (if applicable) Yes  Which pharmacy would you like this sent to? [facility], [place]  Pt had last appt with Christy on 10/03/19   Patient notified that their request is being sent to the clinical staff for review and that they should receive a response within 2 business days.

## 2019-10-26 ENCOUNTER — Ambulatory Visit: Payer: 59 | Admitting: Gastroenterology

## 2019-10-26 ENCOUNTER — Other Ambulatory Visit: Payer: Self-pay | Admitting: Orthopaedic Surgery

## 2019-10-26 DIAGNOSIS — G8929 Other chronic pain: Secondary | ICD-10-CM

## 2019-11-09 ENCOUNTER — Other Ambulatory Visit: Payer: Self-pay | Admitting: *Deleted

## 2019-11-09 DIAGNOSIS — F411 Generalized anxiety disorder: Secondary | ICD-10-CM

## 2019-11-09 DIAGNOSIS — Z1231 Encounter for screening mammogram for malignant neoplasm of breast: Secondary | ICD-10-CM | POA: Diagnosis not present

## 2019-11-09 MED ORDER — BUSPIRONE HCL 15 MG PO TABS: 15.0000 mg | ORAL_TABLET | Freq: Three times a day (TID) | ORAL | 2 refills | Status: DC

## 2019-11-22 ENCOUNTER — Other Ambulatory Visit: Payer: Self-pay | Admitting: Family Medicine

## 2019-11-22 DIAGNOSIS — G8929 Other chronic pain: Secondary | ICD-10-CM

## 2019-12-14 ENCOUNTER — Other Ambulatory Visit: Payer: Self-pay

## 2019-12-14 MED ORDER — CYCLOBENZAPRINE HCL 10 MG PO TABS: 10.0000 mg | ORAL_TABLET | Freq: Three times a day (TID) | ORAL | 0 refills | Status: DC

## 2020-02-10 ENCOUNTER — Other Ambulatory Visit: Payer: Self-pay

## 2020-02-10 DIAGNOSIS — I10 Essential (primary) hypertension: Secondary | ICD-10-CM

## 2020-02-10 MED ORDER — LISINOPRIL 20 MG PO TABS: 20.0000 mg | ORAL_TABLET | Freq: Every day | ORAL | 1 refills | Status: DC

## 2020-02-11 ENCOUNTER — Other Ambulatory Visit: Payer: Self-pay | Admitting: Family

## 2020-02-11 DIAGNOSIS — G8929 Other chronic pain: Secondary | ICD-10-CM

## 2020-04-20 ENCOUNTER — Other Ambulatory Visit: Payer: Self-pay | Admitting: Family

## 2020-04-20 DIAGNOSIS — F411 Generalized anxiety disorder: Secondary | ICD-10-CM

## 2020-05-11 ENCOUNTER — Other Ambulatory Visit: Payer: Self-pay | Admitting: Family

## 2020-05-11 DIAGNOSIS — G8929 Other chronic pain: Secondary | ICD-10-CM

## 2020-05-14 NOTE — Telephone Encounter (Signed)
Last OV 09/2019, last labs 09/2019, no appt scheduled for follow up

## 2020-05-20 ENCOUNTER — Other Ambulatory Visit: Payer: Self-pay | Admitting: Family

## 2020-05-20 DIAGNOSIS — F411 Generalized anxiety disorder: Secondary | ICD-10-CM

## 2020-06-29 DIAGNOSIS — J329 Chronic sinusitis, unspecified: Secondary | ICD-10-CM | POA: Diagnosis not present

## 2020-06-29 DIAGNOSIS — R059 Cough, unspecified: Secondary | ICD-10-CM | POA: Diagnosis not present

## 2020-06-29 DIAGNOSIS — R110 Nausea: Secondary | ICD-10-CM | POA: Diagnosis not present

## 2020-06-29 DIAGNOSIS — H9203 Otalgia, bilateral: Secondary | ICD-10-CM | POA: Diagnosis not present

## 2020-06-29 DIAGNOSIS — R0981 Nasal congestion: Secondary | ICD-10-CM | POA: Diagnosis not present

## 2020-07-05 ENCOUNTER — Ambulatory Visit (INDEPENDENT_AMBULATORY_CARE_PROVIDER_SITE_OTHER): Payer: Medicare Other | Admitting: Family

## 2020-07-05 ENCOUNTER — Encounter: Payer: Self-pay | Admitting: Family

## 2020-07-05 DIAGNOSIS — I10 Essential (primary) hypertension: Secondary | ICD-10-CM

## 2020-07-05 DIAGNOSIS — G4700 Insomnia, unspecified: Secondary | ICD-10-CM

## 2020-07-05 DIAGNOSIS — G43809 Other migraine, not intractable, without status migrainosus: Secondary | ICD-10-CM

## 2020-07-05 DIAGNOSIS — M8949 Other hypertrophic osteoarthropathy, multiple sites: Secondary | ICD-10-CM | POA: Diagnosis not present

## 2020-07-05 DIAGNOSIS — F320 Major depressive disorder, single episode, mild: Secondary | ICD-10-CM

## 2020-07-05 DIAGNOSIS — M25511 Pain in right shoulder: Secondary | ICD-10-CM | POA: Diagnosis not present

## 2020-07-05 DIAGNOSIS — G8929 Other chronic pain: Secondary | ICD-10-CM | POA: Diagnosis not present

## 2020-07-05 DIAGNOSIS — M159 Polyosteoarthritis, unspecified: Secondary | ICD-10-CM

## 2020-07-05 DIAGNOSIS — F411 Generalized anxiety disorder: Secondary | ICD-10-CM

## 2020-07-05 DIAGNOSIS — K5900 Constipation, unspecified: Secondary | ICD-10-CM

## 2020-07-05 DIAGNOSIS — M1990 Unspecified osteoarthritis, unspecified site: Secondary | ICD-10-CM | POA: Insufficient documentation

## 2020-07-05 DIAGNOSIS — Z8673 Personal history of transient ischemic attack (TIA), and cerebral infarction without residual deficits: Secondary | ICD-10-CM | POA: Diagnosis not present

## 2020-07-05 MED ORDER — BUSPIRONE HCL 15 MG PO TABS: 15.0000 mg | ORAL_TABLET | Freq: Three times a day (TID) | ORAL | 0 refills | Status: DC

## 2020-07-05 MED ORDER — TRAZODONE HCL 50 MG PO TABS: 150.0000 mg | ORAL_TABLET | Freq: Every day | ORAL | 5 refills | Status: DC

## 2020-07-05 MED ORDER — DICLOFENAC SODIUM 75 MG PO TBEC
75.0000 mg | DELAYED_RELEASE_TABLET | Freq: Two times a day (BID) | ORAL | 1 refills | Status: DC
Start: 2020-07-05 — End: 2020-12-12

## 2020-07-05 MED ORDER — LISINOPRIL 20 MG PO TABS: 20.0000 mg | ORAL_TABLET | Freq: Every day | ORAL | 1 refills | Status: DC

## 2020-07-05 NOTE — Progress Notes (Signed)
Virtual Visit via telephone Note Due to COVID-19 pandemic this visit was conducted virtually. This visit type was conducted due to national recommendations for restrictions regarding the COVID-19 Pandemic (e.g. social distancing, sheltering in place) in an effort to limit this patient's exposure and mitigate transmission in our community. All issues noted in this document were discussed and addressed.  A physical exam was not performed with this format.  I connected with Tracy Jensen on 07/05/20 at 11:52 AM  by telephone and verified that I am speaking with the correct person using two identifiers. Tracy Jensen is currently located at car and no one is currently with her during visit. The provider, Evelina Dun, FNP is located in their office at time of visit.  I discussed the limitations, risks, security and privacy concerns of performing an evaluation and management service by telephone and the availability of in person appointments. I also discussed with the patient that there may be a patient responsible charge related to this service. The patient expressed understanding and agreed to proceed.   History and Present Illness:  Pt calls to the office today for chronic follow up. She reports she was diagnosed with COVID 06/29/20.   She reports her grandchild is currently in foster care and this has caused a deal of stress on her. She reports he has lived with her most of his life.  Headache  This is a chronic problem. The current episode started more than 1 year ago. The problem occurs intermittently. Associated symptoms include insomnia. Her past medical history is significant for hypertension.  Hypertension This is a chronic problem. The current episode started more than 1 year ago. The problem has been waxing and waning since onset. The problem is controlled. Associated symptoms include anxiety, headaches and malaise/fatigue. Pertinent negatives include no peripheral edema or shortness of  breath. The current treatment provides moderate improvement.  Insomnia Primary symptoms: difficulty falling asleep, frequent awakening, malaise/fatigue.  The current episode started more than one year. The onset quality is gradual. The problem occurs intermittently. Past treatments include medication. The treatment provided mild relief. PMH includes: depression.  Anxiety Presents for follow-up visit. Symptoms include depressed mood, excessive worry, insomnia, irritability, nervous/anxious behavior and restlessness. Patient reports no shortness of breath. Symptoms occur constantly.    Depression        This is a chronic problem.  The current episode started more than 1 year ago.   The onset quality is gradual.   The problem occurs intermittently.  Associated symptoms include insomnia, irritable, restlessness, headaches and sad.  Past medical history includes anxiety.   Constipation This is a chronic problem. The current episode started more than 1 year ago. The problem has been waxing and waning since onset. She has tried laxatives for the symptoms. The treatment provided mild relief.  Arthritis Presents for follow-up visit. She complains of pain and stiffness. The symptoms have been stable. Affected location: back. Her pain is at a severity of 7/10.      Review of Systems  Constitutional: Positive for irritability and malaise/fatigue.  Respiratory: Negative for shortness of breath.   Gastrointestinal: Positive for constipation.  Musculoskeletal: Positive for arthritis and stiffness.  Neurological: Positive for headaches.  Psychiatric/Behavioral: Positive for depression. The patient is nervous/anxious and has insomnia.   All other systems reviewed and are negative.    Observations/Objective: No SOB or distress   Assessment and Plan: 1. HTN (hypertension), benign - lisinopril (ZESTRIL) 20 MG tablet; Take 1 tablet (20  mg total) by mouth daily.  Dispense: 90 tablet; Refill: 1 -  CMP14+EGFR; Future - CBC with Differential/Platelet; Future  2. GAD (generalized anxiety disorder) - busPIRone (BUSPAR) 15 MG tablet; Take 1 tablet (15 mg total) by mouth 3 (three) times daily. (Needs to be seen before next refill)  Dispense: 90 tablet; Refill: 0 - CMP14+EGFR; Future - CBC with Differential/Platelet; Future  3. Insomnia, unspecified type -Increase trazodone to 150 mg from 180m  Sleep ritual discussed  - CMP14+EGFR; Future - CBC with Differential/Platelet; Future  4. History of CVA (cerebrovascular accident) - CMP14+EGFR; Future - CBC with Differential/Platelet; Future  5. [place] mild episode of [place] CMP14+EGFR; Future - CBC with Differential/Platelet; Future  6. Constipation, unspecified constipation type - CMP14+EGFR; Future - CBC with Differential/Platelet; Future  7. Other migraine without status migrainosus, not intractable - CMP14+EGFR; Future - CBC with Differential/Platelet; Future  8. Chronic right shoulder pain - diclofenac (VOLTAREN) 75 MG EC tablet; Take 1 tablet (75 mg total) by mouth 2 (two) times daily.  Dispense: 180 tablet; Refill: 1 - CMP14+EGFR; Future - CBC with Differential/Platelet; Future  9. Primary osteoarthritis involving multiple joints - diclofenac (VOLTAREN) 75 MG EC tablet; Take 1 tablet (75 mg total) by mouth 2 (two) times daily.  Dispense: 180 tablet; Refill: 1 - CMP14+EGFR; Future - CBC with Differential/Platelet; Future   Labs pending Follow up in 3 months    I discussed the assessment and treatment plan with the patient. The patient was provided an opportunity to ask questions and all were answered. The patient agreed with the plan and demonstrated an understanding of the instructions.   The patient was advised to call back or seek an in-person evaluation if the symptoms worsen or if the condition fails to improve as anticipated.  The above assessment and management  plan was discussed with the patient. The patient verbalized understanding of and has agreed to the management plan. Patient is aware to call the clinic if symptoms persist or worsen. Patient is aware when to return to the clinic for a follow-up visit. Patient educated on when it is appropriate to go to the emergency department.   Time call ended:  12:13 pm   I provided  21 minutes of non-face-to-face time during this encounter.    CEvelina Dun FNP

## 2020-08-10 ENCOUNTER — Other Ambulatory Visit: Payer: Self-pay | Admitting: Family

## 2020-08-10 DIAGNOSIS — F411 Generalized anxiety disorder: Secondary | ICD-10-CM

## 2020-09-05 ENCOUNTER — Other Ambulatory Visit: Payer: Self-pay

## 2020-09-05 ENCOUNTER — Ambulatory Visit (INDEPENDENT_AMBULATORY_CARE_PROVIDER_SITE_OTHER): Payer: Medicare Other | Admitting: Orthopaedic Surgery

## 2020-09-05 ENCOUNTER — Encounter: Payer: Self-pay | Admitting: Orthopaedic Surgery

## 2020-09-05 ENCOUNTER — Ambulatory Visit: Payer: Self-pay

## 2020-09-05 VITALS — Ht <= 58 in | Wt 110.0 lb

## 2020-09-05 DIAGNOSIS — M25512 Pain in left shoulder: Secondary | ICD-10-CM | POA: Diagnosis not present

## 2020-09-05 DIAGNOSIS — G8929 Other chronic pain: Secondary | ICD-10-CM | POA: Diagnosis not present

## 2020-09-05 MED ORDER — LIDOCAINE HCL 2 % IJ SOLN
2.0000 mL | INTRAMUSCULAR | Status: AC | PRN
  Administered 2020-09-05: 2 mL

## 2020-09-05 MED ORDER — BUPIVACAINE HCL 0.25 % IJ SOLN
2.0000 mL | INTRAMUSCULAR | Status: AC | PRN
  Administered 2020-09-05: 2 mL via INTRA_ARTICULAR

## 2020-09-05 NOTE — Progress Notes (Signed)
Office Visit Note   Patient: Tracy Jensen           Date of Birth: Sep 08, 1955           MRN: [id number] Visit Date: 09/05/2020              Requested by: Junie Spencer, FNP [address] [place],  [place] [address] PCP: Junie Spencer, FNP   Assessment & Plan: Visit Diagnoses:  1. Chronic left shoulder pain     Plan: Tracy Jensen has had left shoulder pain for several months.  The discomfort was insidious in onset without specific.  She has more pain when she lifts overhead and some trouble sleeping at night.  Films were negative.  Has had prior rotator cuff tear repair on the right side and is done well and certainly a chance that there is a rotator cuff tear on the left side.  Patient would like to try cortisone injection first and then consider MRI scan if no improvement  Follow-Up Instructions: Return if symptoms worsen or fail to improve.   Orders:  Orders Placed This Encounter  Procedures  . Large Joint Inj: L subacromial bursa  . XR Shoulder Left   No orders of the defined types were placed in this encounter.     Procedures: Large Joint Inj: L subacromial bursa on 09/05/2020 11:40 AM Indications: pain and diagnostic evaluation Details: 25 G 1.5 in needle, anterolateral approach  Arthrogram: No  Medications: 2 mL lidocaine 2 %; 2 mL bupivacaine 0.25 %  12 mg betamethasone injected into the subacromial space left shoulder with Marcaine and Xylocaine Consent was given by the patient. Immediately prior to procedure a time out was called to verify the correct patient, procedure, equipment, support staff and site/side marked as required. Patient was prepped and draped in the usual sterile fashion.       Clinical Data: No additional findings.   Subjective: Chief Complaint  Patient presents with  . Left Shoulder - Pain  Patient presents today for left shoulder pain. She said that it started several months ago, with no injury. Her pain is getting worse. She  points to her proximal arm as the area of discomfort. It is worse with lifting anything heavy. She states that it feels tight. No numbness, tingling, or weakness. She can lift her arm over her head without difficulty. She takes Tylenol as needed. She is right hand dominant.   HPI  Review of Systems   Objective: Vital Signs: Ht 4\' 9"  (1.448 m)   Wt 110 lb (49.9 kg)   BMI 23.80 kg/m   Physical Exam Constitutional:      Appearance: She is well-developed.  Eyes:     Pupils: Pupils are equal, round, and reactive to light.  Pulmonary:     Effort: Pulmonary effort is normal.  Skin:    General: Skin is warm and dry.  Neurological:     Mental Status: She is alert and oriented to person, place, and time.  Psychiatric:        Behavior: Behavior normal.     Ortho Exam positive impingement and extreme of external rotation but no loss of motion.  Able to place left arm easily overhead but a little bit of a circuitous arc of motion.  Some pain at the [facility] joint and the anterior subacromial region.  Biceps intact.  Negative Speed sign.  Positive empty can testing.  No crepitation.  Good grip and release.  Neurologically intact.  No neck pain  Specialty Comments:  No specialty comments available.  Imaging: XR Shoulder Left  Result Date: 09/05/2020 Films of the left shoulder obtained in several projections.  Humeral head is centered in the glenoid without evidence of joint narrowing or spur formation.  Some degenerative change at the [facility] joint with hypertrophic changes more superiorly than inferiorly.  Normal space between the humeral head and the acromium.  No ectopic calcification or acute changes    PMFS History: Patient Active Problem List   Diagnosis Date Noted  . Pain in left shoulder 09/05/2020  . Osteoarthritis 07/05/2020  . Constipation 09/09/2019  . Insomnia 09/09/2019  . GAD (generalized anxiety disorder) 11/15/2018  . Chronic right shoulder pain 08/05/2018  . Chronic  right-sided low back pain with right-sided sciatica 10/10/2017  . Olecranon bursitis of right elbow 07/14/2016  . Migraine 07/14/2016  . Sexual dysfunction 07/14/2016  . History of CVA (cerebrovascular accident) 04/08/2016  . HTN (hypertension), benign 04/08/2016  . Depression 04/08/2016   Past Medical History:  Diagnosis Date  . Hypertension   . Migraines   . Sciatica   . TIA (transient ischemic attack)     History reviewed. No pertinent family history.  History reviewed. No pertinent surgical history. Social History   Occupational History  . Not on file  Tobacco Use  . Smoking status: Never Smoker  . Smokeless tobacco: Never Used  Vaping Use  . Vaping Use: Never used  Substance and Sexual Activity  . Alcohol use: Yes    Comment: occ  . Drug use: No  . Sexual activity: Not on file

## 2020-09-21 ENCOUNTER — Other Ambulatory Visit: Payer: Medicare Other

## 2020-09-21 ENCOUNTER — Other Ambulatory Visit: Payer: Self-pay

## 2020-09-21 DIAGNOSIS — G4700 Insomnia, unspecified: Secondary | ICD-10-CM | POA: Diagnosis not present

## 2020-09-21 DIAGNOSIS — Z8673 Personal history of transient ischemic attack (TIA), and cerebral infarction without residual deficits: Secondary | ICD-10-CM | POA: Diagnosis not present

## 2020-09-21 DIAGNOSIS — I10 Essential (primary) hypertension: Secondary | ICD-10-CM | POA: Diagnosis not present

## 2020-09-21 NOTE — Addendum Note (Signed)
Addended by: Francee Nodal on: 09/21/2020 09:06 AM   Modules accepted: Orders

## 2020-09-22 LAB — CMP14+EGFR
ALT: 15 IU/L (ref 0–32)
AST: 12 IU/L (ref 0–40)
Albumin/Globulin Ratio: 2.5 — ABNORMAL HIGH (ref 1.2–2.2)
Albumin: 4.5 g/dL (ref 3.8–4.8)
Alkaline Phosphatase: 61 IU/L (ref 44–121)
BUN/Creatinine Ratio: 24 (ref 12–28)
BUN: 24 mg/dL (ref 8–27)
Bilirubin Total: 0.2 mg/dL (ref 0.0–1.2)
CO2: 23 mmol/L (ref 20–29)
Calcium: 9.7 mg/dL (ref 8.7–10.3)
Chloride: 102 mmol/L (ref 96–106)
Creatinine, Ser: 1.02 mg/dL — ABNORMAL HIGH (ref 0.57–1.00)
Globulin, Total: 1.8 g/dL (ref 1.5–4.5)
Glucose: 96 mg/dL (ref 65–99)
Potassium: 4.7 mmol/L (ref 3.5–5.2)
Sodium: 141 mmol/L (ref 134–144)
Total Protein: 6.3 g/dL (ref 6.0–8.5)
eGFR: 61 mL/min/{1.73_m2} (ref >59)

## 2020-09-22 LAB — CBC WITH DIFFERENTIAL/PLATELET
Basophils Absolute: 0.1 10*3/uL (ref 0.0–0.2)
Basos: 1 %
EOS (ABSOLUTE): 0.3 10*3/uL (ref 0.0–0.4)
Eos: 6 %
Hematocrit: 37.9 % (ref 34.0–46.6)
Hemoglobin: 12.4 g/dL (ref 11.1–15.9)
Immature Grans (Abs): 0 10*3/uL (ref 0.0–0.1)
Immature Granulocytes: 0 %
Lymphocytes Absolute: 1.7 10*3/uL (ref 0.7–3.1)
Lymphs: 37 %
MCH: 31.2 pg (ref 26.6–33.0)
MCHC: 32.7 g/dL (ref 31.5–35.7)
MCV: 95 fL (ref 79–97)
Monocytes Absolute: 0.5 10*3/uL (ref 0.1–0.9)
Monocytes: 10 %
Neutrophils Absolute: 2.1 10*3/uL (ref 1.4–7.0)
Neutrophils: 46 %
Platelets: 225 10*3/uL (ref 150–450)
RBC: 3.98 x10E6/uL (ref 3.77–5.28)
RDW: 12 % (ref 11.7–15.4)
WBC: 4.6 10*3/uL (ref 3.4–10.8)

## 2020-10-11 ENCOUNTER — Encounter: Payer: Medicare Other | Admitting: Family

## 2020-10-15 DIAGNOSIS — H2512 Age-related nuclear cataract, left eye: Secondary | ICD-10-CM | POA: Diagnosis not present

## 2020-10-15 DIAGNOSIS — H2513 Age-related nuclear cataract, bilateral: Secondary | ICD-10-CM | POA: Diagnosis not present

## 2020-10-15 DIAGNOSIS — I10 Essential (primary) hypertension: Secondary | ICD-10-CM | POA: Diagnosis not present

## 2020-11-01 DIAGNOSIS — H52202 Unspecified astigmatism, left eye: Secondary | ICD-10-CM | POA: Diagnosis not present

## 2020-11-01 DIAGNOSIS — H2512 Age-related nuclear cataract, left eye: Secondary | ICD-10-CM | POA: Diagnosis not present

## 2020-11-02 DIAGNOSIS — H2511 Age-related nuclear cataract, right eye: Secondary | ICD-10-CM | POA: Diagnosis not present

## 2020-11-14 ENCOUNTER — Telehealth: Payer: Self-pay | Admitting: Family

## 2020-11-14 DIAGNOSIS — F411 Generalized anxiety disorder: Secondary | ICD-10-CM

## 2020-11-14 MED ORDER — BUSPIRONE HCL 15 MG PO TABS: 15.0000 mg | ORAL_TABLET | Freq: Three times a day (TID) | ORAL | 0 refills | Status: DC

## 2020-11-14 NOTE — Telephone Encounter (Signed)
Pt aware refill sent to pharmacy 

## 2020-11-14 NOTE — Telephone Encounter (Signed)
  Prescription Request  11/14/2020  What is the name of the medication or equipment? Buspirone 15 mg Patient has appt with Neysa Bonito 6-29  Have you contacted your pharmacy to request a refill? (if applicable) NO  Which pharmacy would you like this sent to? [facility]   Patient notified that their request is being sent to the clinical staff for review and that they should receive a response within 2 business days.

## 2020-12-12 ENCOUNTER — Other Ambulatory Visit: Payer: Self-pay

## 2020-12-12 ENCOUNTER — Encounter: Payer: Self-pay | Admitting: Family

## 2020-12-12 ENCOUNTER — Ambulatory Visit (INDEPENDENT_AMBULATORY_CARE_PROVIDER_SITE_OTHER): Payer: Medicare Other | Admitting: Family

## 2020-12-12 VITALS — BP 127/73 | HR 61 | Temp 97.5°F | Ht <= 58 in | Wt 118.0 lb

## 2020-12-12 DIAGNOSIS — M159 Polyosteoarthritis, unspecified: Secondary | ICD-10-CM

## 2020-12-12 DIAGNOSIS — F411 Generalized anxiety disorder: Secondary | ICD-10-CM

## 2020-12-12 DIAGNOSIS — M25511 Pain in right shoulder: Secondary | ICD-10-CM | POA: Diagnosis not present

## 2020-12-12 DIAGNOSIS — G8929 Other chronic pain: Secondary | ICD-10-CM

## 2020-12-12 DIAGNOSIS — G4700 Insomnia, unspecified: Secondary | ICD-10-CM

## 2020-12-12 DIAGNOSIS — I10 Essential (primary) hypertension: Secondary | ICD-10-CM

## 2020-12-12 DIAGNOSIS — M8949 Other hypertrophic osteoarthropathy, multiple sites: Secondary | ICD-10-CM | POA: Diagnosis not present

## 2020-12-12 DIAGNOSIS — K5900 Constipation, unspecified: Secondary | ICD-10-CM | POA: Diagnosis not present

## 2020-12-12 DIAGNOSIS — E785 Hyperlipidemia, unspecified: Secondary | ICD-10-CM | POA: Diagnosis not present

## 2020-12-12 DIAGNOSIS — N3281 Overactive bladder: Secondary | ICD-10-CM

## 2020-12-12 DIAGNOSIS — M150 Primary generalized (osteo)arthritis: Secondary | ICD-10-CM

## 2020-12-12 DIAGNOSIS — F320 Major depressive disorder, single episode, mild: Secondary | ICD-10-CM

## 2020-12-12 LAB — URINALYSIS
Bilirubin, UA: NEGATIVE
Glucose, UA: NEGATIVE
Ketones, UA: NEGATIVE
Leukocytes,UA: NEGATIVE
Nitrite, UA: NEGATIVE
Protein,UA: NEGATIVE
RBC, UA: NEGATIVE
Specific Gravity, UA: 1.01 (ref 1.005–1.030)
Urobilinogen, Ur: 0.2 mg/dL (ref 0.2–1.0)
pH, UA: 5.5 (ref 5.0–7.5)

## 2020-12-12 MED ORDER — TRAZODONE HCL 50 MG PO TABS: 150.0000 mg | ORAL_TABLET | Freq: Every day | ORAL | 5 refills | Status: DC

## 2020-12-12 MED ORDER — CYCLOBENZAPRINE HCL 10 MG PO TABS: 10.0000 mg | ORAL_TABLET | Freq: Three times a day (TID) | ORAL | 0 refills | Status: DC

## 2020-12-12 MED ORDER — BUSPIRONE HCL 15 MG PO TABS
15.0000 mg | ORAL_TABLET | Freq: Three times a day (TID) | ORAL | 0 refills | Status: DC
Start: 2020-12-12 — End: 2021-05-08

## 2020-12-12 MED ORDER — ROSUVASTATIN CALCIUM 5 MG PO TABS: 5.0000 mg | ORAL_TABLET | Freq: Every day | ORAL | 3 refills | Status: DC

## 2020-12-12 MED ORDER — MIRABEGRON ER 25 MG PO TB24
25.0000 mg | ORAL_TABLET | Freq: Every day | ORAL | 1 refills | Status: DC
Start: 2020-12-12 — End: 2021-03-06

## 2020-12-12 MED ORDER — DICLOFENAC SODIUM 75 MG PO TBEC: 75.0000 mg | DELAYED_RELEASE_TABLET | Freq: Two times a day (BID) | ORAL | 1 refills | Status: DC

## 2020-12-12 MED ORDER — LISINOPRIL 20 MG PO TABS: 20.0000 mg | ORAL_TABLET | Freq: Every day | ORAL | 1 refills | Status: DC

## 2020-12-12 NOTE — Patient Instructions (Signed)

## 2020-12-12 NOTE — Progress Notes (Signed)
Subjective:    Patient ID: Tracy Jensen, female    DOB: 1955/09/05, 65 y.o.   MRN: [id number]  Chief Complaint  Patient presents with   Medical Management of Chronic Issues   Pt calls to the office today for chronic follow up.   She reports her grandchild is currently in foster care and this has caused a deal of stress on her. She reports he has lived with her most of his life.  Hypertension This is a chronic problem. The current episode started more than 1 year ago. The problem has been resolved since onset. The problem is controlled. Associated symptoms include anxiety. Pertinent negatives include no malaise/fatigue, peripheral edema or shortness of breath. Risk factors for coronary artery disease include dyslipidemia and sedentary lifestyle. The current treatment provides moderate improvement. There is no history of heart failure.  Depression        This is a chronic problem.  The current episode started more than 1 year ago.   The onset quality is gradual.   The problem occurs intermittently.  Associated symptoms include insomnia, irritable, decreased interest and sad.  Past medical history includes anxiety.   Arthritis Presents for follow-up visit. She complains of pain and stiffness. The symptoms have been stable. Affected locations include the left knee, right knee, left MCP, right MCP, left shoulder and right shoulder. Her pain is at a severity of 6/10.  Insomnia Primary symptoms: difficulty falling asleep, frequent awakening, no malaise/fatigue.   The current episode started more than one year. The onset quality is gradual. The problem occurs intermittently. Past treatments include meditation. The treatment provided mild relief. PMH includes: depression.   Anxiety Symptoms include insomnia. Patient reports no shortness of breath.    Urinary Frequency  This is a new problem. The current episode started more than 1 month ago. The problem occurs intermittently. The patient is  experiencing no pain. Associated symptoms include frequency and urgency. Pertinent negatives include no hematuria or hesitancy. She has tried increased fluids for the symptoms. The treatment provided moderate relief.  Constipation This is a chronic problem. The current episode started more than 1 year ago. The problem has been resolved since onset. Her stool frequency is 1 time per day. She has tried laxatives for the symptoms. The treatment provided moderate relief.     Review of Systems  Constitutional:  Negative for malaise/fatigue.  Respiratory:  Negative for shortness of breath.   Gastrointestinal:  Positive for constipation.  Genitourinary:  Positive for frequency and urgency. Negative for hematuria and hesitancy.  Musculoskeletal:  Positive for arthritis and stiffness.  Psychiatric/Behavioral:  Positive for depression. The patient has insomnia.   All other systems reviewed and are negative.     Objective:   Physical Exam Vitals reviewed.  Constitutional:      General: She is irritable. She is not in acute distress.    Appearance: She is well-developed.  HENT:     Head: Normocephalic and atraumatic.     Right Ear: Tympanic membrane normal.     Left Ear: Tympanic membrane normal.  Eyes:     Pupils: Pupils are equal, round, and reactive to light.  Neck:     Thyroid: No thyromegaly.  Cardiovascular:     Rate and Rhythm: Normal rate and regular rhythm.     Heart sounds: Normal heart sounds. No murmur heard. Pulmonary:     Effort: Pulmonary effort is normal. No respiratory distress.     Breath sounds: Normal breath sounds. No  wheezing.  Abdominal:     General: Bowel sounds are normal. There is no distension.     Palpations: Abdomen is soft.     Tenderness: There is no abdominal tenderness.  Musculoskeletal:        General: No tenderness. Normal range of motion.     Cervical back: Normal range of motion and neck supple.  Skin:    General: Skin is warm and dry.   Neurological:     Mental Status: She is alert and oriented to person, place, and time.     Cranial Nerves: No cranial nerve deficit.     Deep Tendon Reflexes: Reflexes are normal and symmetric.  Psychiatric:        Behavior: Behavior normal.        Thought Content: Thought content normal.        Judgment: Judgment normal.      BP 127/73   Pulse 61   Temp (!) 97.5 F (36.4 C) (Temporal)   Ht 4\' 9"  (1.448 m)   Wt 118 lb (53.5 kg)   BMI 25.53 kg/m      Assessment & Plan:  Tracy Jensen comes in today with chief complaint of Medical Management of Chronic Issues   Diagnosis and orders addressed:  1. Chronic right shoulder pain - diclofenac (VOLTAREN) 75 MG EC tablet; Take 1 tablet (75 mg total) by mouth 2 (two) times daily.  Dispense: 180 tablet; Refill: 1  2. Primary osteoarthritis involving multiple joints - diclofenac (VOLTAREN) 75 MG EC tablet; Take 1 tablet (75 mg total) by mouth 2 (two) times daily.  Dispense: 180 tablet; Refill: 1  3. GAD (generalized anxiety disorder) - busPIRone (BUSPAR) 15 MG tablet; Take 1 tablet (15 mg total) by mouth 3 (three) times daily.  Dispense: 270 tablet; Refill: 0  4. HTN (hypertension), benign - lisinopril (ZESTRIL) 20 MG tablet; Take 1 tablet (20 mg total) by mouth daily.  Dispense: 90 tablet; Refill: 1  5. Insomnia, unspecified type  6. Current mild episode of major depressive disorder without prior episode (HCC)  7. Constipation, unspecified constipation type  8. Hyperlipidemia, unspecified hyperlipidemia type Start Crestor 5 mg  Low fat diet and exercise  - rosuvastatin (CRESTOR) 5 MG tablet; Take 1 tablet (5 mg total) by mouth daily.  Dispense: 90 tablet; Refill: 3  9. Overactive bladder Will start Myrbetriq 25 mg  Avoid caffeine  - mirabegron ER (MYRBETRIQ) 25 MG TB24 tablet; Take 1 tablet (25 mg total) by mouth daily.  Dispense: 90 tablet; Refill: 1 - Urinalysis   Labs pending Health Maintenance reviewed Diet and  exercise encouraged  Follow up plan: 3 months    [name], FNP

## 2021-02-14 DIAGNOSIS — H2511 Age-related nuclear cataract, right eye: Secondary | ICD-10-CM | POA: Diagnosis not present

## 2021-02-21 DIAGNOSIS — H04123 Dry eye syndrome of bilateral lacrimal glands: Secondary | ICD-10-CM | POA: Diagnosis not present

## 2021-03-06 ENCOUNTER — Other Ambulatory Visit: Payer: Self-pay | Admitting: Family

## 2021-03-06 ENCOUNTER — Other Ambulatory Visit: Payer: Self-pay

## 2021-03-06 ENCOUNTER — Encounter: Payer: Self-pay | Admitting: Family

## 2021-03-06 ENCOUNTER — Ambulatory Visit (INDEPENDENT_AMBULATORY_CARE_PROVIDER_SITE_OTHER): Payer: Medicare Other | Admitting: Family

## 2021-03-06 ENCOUNTER — Ambulatory Visit (INDEPENDENT_AMBULATORY_CARE_PROVIDER_SITE_OTHER): Payer: Medicare Other

## 2021-03-06 VITALS — BP 124/80 | HR 78 | Temp 97.8°F | Ht <= 58 in | Wt 112.6 lb

## 2021-03-06 DIAGNOSIS — M159 Polyosteoarthritis, unspecified: Secondary | ICD-10-CM

## 2021-03-06 DIAGNOSIS — F411 Generalized anxiety disorder: Secondary | ICD-10-CM

## 2021-03-06 DIAGNOSIS — Z23 Encounter for immunization: Secondary | ICD-10-CM | POA: Diagnosis not present

## 2021-03-06 DIAGNOSIS — K5900 Constipation, unspecified: Secondary | ICD-10-CM

## 2021-03-06 DIAGNOSIS — Z780 Asymptomatic menopausal state: Secondary | ICD-10-CM

## 2021-03-06 DIAGNOSIS — I10 Essential (primary) hypertension: Secondary | ICD-10-CM

## 2021-03-06 DIAGNOSIS — N3281 Overactive bladder: Secondary | ICD-10-CM

## 2021-03-06 DIAGNOSIS — F320 Major depressive disorder, single episode, mild: Secondary | ICD-10-CM

## 2021-03-06 DIAGNOSIS — M8949 Other hypertrophic osteoarthropathy, multiple sites: Secondary | ICD-10-CM

## 2021-03-06 DIAGNOSIS — G8929 Other chronic pain: Secondary | ICD-10-CM

## 2021-03-06 DIAGNOSIS — M5441 Lumbago with sciatica, right side: Secondary | ICD-10-CM

## 2021-03-06 DIAGNOSIS — M150 Primary generalized (osteo)arthritis: Secondary | ICD-10-CM

## 2021-03-06 DIAGNOSIS — G4700 Insomnia, unspecified: Secondary | ICD-10-CM

## 2021-03-06 DIAGNOSIS — M810 Age-related osteoporosis without current pathological fracture: Secondary | ICD-10-CM | POA: Diagnosis not present

## 2021-03-06 LAB — CMP14+EGFR
ALT: 14 IU/L (ref 0–32)
AST: 18 IU/L (ref 0–40)
Albumin/Globulin Ratio: 2.7 — ABNORMAL HIGH (ref 1.2–2.2)
Albumin: 4.9 g/dL — ABNORMAL HIGH (ref 3.8–4.8)
Alkaline Phosphatase: 75 IU/L (ref 44–121)
BUN/Creatinine Ratio: 22 (ref 12–28)
BUN: 17 mg/dL (ref 8–27)
Bilirubin Total: 0.2 mg/dL (ref 0.0–1.2)
CO2: 25 mmol/L (ref 20–29)
Calcium: 9.5 mg/dL (ref 8.7–10.3)
Chloride: 103 mmol/L (ref 96–106)
Creatinine, Ser: 0.79 mg/dL (ref 0.57–1.00)
Globulin, Total: 1.8 g/dL (ref 1.5–4.5)
Glucose: 86 mg/dL (ref 65–99)
Potassium: 4.3 mmol/L (ref 3.5–5.2)
Sodium: 143 mmol/L (ref 134–144)
Total Protein: 6.7 g/dL (ref 6.0–8.5)
eGFR: 82 mL/min/{1.73_m2} (ref >59)

## 2021-03-06 MED ORDER — ALENDRONATE SODIUM 70 MG PO TABS: 70.0000 mg | ORAL_TABLET | ORAL | 1 refills | Status: DC

## 2021-03-06 MED ORDER — MIRABEGRON ER 50 MG PO TB24: 50.0000 mg | ORAL_TABLET | Freq: Every day | ORAL | 2 refills | Status: DC

## 2021-03-06 NOTE — Progress Notes (Signed)
Subjective:    Patient ID: Tracy Jensen, female    DOB: 03-24-1956, 65 y.o.   MRN: [id number]  Chief Complaint  Patient presents with   Medical Management of Chronic Issues   Pt calls to the office today for chronic follow up.   She reports her grandchild is currently in foster care and this has caused a deal of stress on her. She reports he has lived with her most of his life.   She has lost 6 lbs since our last visit.  Hypertension This is a chronic problem. The current episode started more than 1 year ago. The problem has been resolved since onset. The problem is controlled. Associated symptoms include anxiety. Pertinent negatives include no malaise/fatigue, peripheral edema or shortness of breath. Risk factors for coronary artery disease include dyslipidemia and obesity.  Back Pain This is a chronic problem. The current episode started more than 1 year ago. The problem occurs intermittently. The problem has been waxing and waning since onset. The pain is present in the lumbar spine. The quality of the pain is described as aching. The pain is at a severity of 0/10. The patient is experiencing no pain.  Arthritis Presents for follow-up visit. She complains of pain and stiffness. The symptoms have been stable. Her pain is at a severity of 2/10.  Insomnia Primary symptoms: difficulty falling asleep, frequent awakening, no malaise/fatigue.   The current episode started more than one year. The onset quality is gradual. The problem occurs intermittently. PMH includes: depression.   Constipation This is a chronic problem. The current episode started more than 1 year ago. Associated symptoms include back pain. The treatment provided moderate relief.  Anxiety Presents for follow-up visit. Symptoms include depressed mood, excessive worry, insomnia, nervous/anxious behavior and restlessness. Patient reports no shortness of breath. Symptoms occur most days. The severity of symptoms is moderate.     Depression        This is a chronic problem.  The current episode started more than 1 year ago.   The onset quality is gradual.   The problem occurs intermittently.  Associated symptoms include insomnia, irritable, restlessness and sad.  Associated symptoms include no helplessness and no hopelessness.  Past medical history includes anxiety.   Urinary Frequency  This is a chronic problem. The current episode started more than 1 year ago. The problem occurs intermittently. Associated symptoms include frequency. Treatments tried: myrbetriq.     Review of Systems  Constitutional:  Negative for malaise/fatigue.  Respiratory:  Negative for shortness of breath.   Gastrointestinal:  Positive for constipation.  Genitourinary:  Positive for frequency.  Musculoskeletal:  Positive for arthritis, back pain and stiffness.  Psychiatric/Behavioral:  Positive for depression. The patient is nervous/anxious and has insomnia.   All other systems reviewed and are negative.     Objective:   Physical Exam Vitals reviewed.  Constitutional:      General: She is irritable. She is not in acute distress.    Appearance: She is well-developed.  HENT:     Head: Normocephalic and atraumatic.     Right Ear: Tympanic membrane normal.     Left Ear: Tympanic membrane normal.  Eyes:     Pupils: Pupils are equal, round, and reactive to light.  Neck:     Thyroid: No thyromegaly.  Cardiovascular:     Rate and Rhythm: Normal rate and regular rhythm.     Heart sounds: Normal heart sounds. No murmur heard. Pulmonary:  Effort: Pulmonary effort is normal. No respiratory distress.     Breath sounds: Normal breath sounds. No wheezing.  Abdominal:     General: Bowel sounds are normal. There is no distension.     Palpations: Abdomen is soft.     Tenderness: There is no abdominal tenderness.  Musculoskeletal:        General: No tenderness. Normal range of motion.     Cervical back: Normal range of motion and neck  supple.  Skin:    General: Skin is warm and dry.  Neurological:     Mental Status: She is alert and oriented to person, place, and time.     Cranial Nerves: No cranial nerve deficit.     Deep Tendon Reflexes: Reflexes are normal and symmetric.  Psychiatric:        Behavior: Behavior normal.        Thought Content: Thought content normal.        Judgment: Judgment normal.       BP 124/80   Pulse 78   Temp 97.8 F (36.6 C) (Temporal)   Ht _0  (1.448 m)   Wt 112 lb 9.6 oz (51.1 kg)   BMI 24.37 kg/m   Assessment & Plan:  JANYTH RIERA comes in today with chief complaint of Medical Management of Chronic Issues   Diagnosis and orders addressed:  1. HTN (hypertension), benign - CMP14+EGFR  2. Primary osteoarthritis involving multiple joints - CMP14+EGFR  3. Current mild episode of major depressive disorder without prior episode (HCC) - CMP14+EGFR  4. Insomnia, unspecified type - CMP14+EGFR  5. Constipation, unspecified constipation type - CMP14+EGFR  6. GAD (generalized anxiety disorder) - CMP14+EGFR  7. Chronic right-sided low back pain with right-sided sciatica - CMP14+EGFR  8. OAB (overactive bladder) Will increase Myrbetriq to 50 mg from 25 mg - CMP14+EGFR - mirabegron ER (MYRBETRIQ) 50 MG TB24 tablet; Take 1 tablet (50 mg total) by mouth daily.  Dispense: 90 tablet; Refill: 2  9. Post-menopause - CMP14+EGFR - DG [facility] DEXA   Labs pending Health Maintenance reviewed Diet and exercise encouraged  Follow up plan: 6 months    Evelina Dun, FNP

## 2021-03-06 NOTE — Patient Instructions (Signed)

## 2021-05-06 ENCOUNTER — Ambulatory Visit (INDEPENDENT_AMBULATORY_CARE_PROVIDER_SITE_OTHER): Payer: Medicare Other

## 2021-05-06 VITALS — Ht <= 58 in | Wt 112.0 lb

## 2021-05-06 DIAGNOSIS — Z1231 Encounter for screening mammogram for malignant neoplasm of breast: Secondary | ICD-10-CM | POA: Diagnosis not present

## 2021-05-06 DIAGNOSIS — Z0000 Encounter for general adult medical examination without abnormal findings: Secondary | ICD-10-CM

## 2021-05-06 NOTE — Patient Instructions (Signed)
Tracy Jensen , Thank you for taking time to come for your Medicare Wellness Visit. I appreciate your ongoing commitment to your health goals. Please review the following plan we discussed and let me know if I can assist you in the future.   Screening recommendations/referrals: Colonoscopy: Done 12/20/2013 - Repeat in 10 years  Mammogram: Done 11/21/2019 - ordered repeat today - Repeat annually  Bone Density: Done 03/06/2021 - Repeat every 2 years  Recommended yearly ophthalmology/optometry visit for glaucoma screening and checkup Recommended yearly dental visit for hygiene and checkup  Vaccinations: Influenza vaccine: Done 03/06/2021 - Repeat annually Pneumococcal vaccine: Prevnar-13 done 03/20/2020; due for Pneumovax-23 Tdap vaccine: Done 03/14/2013 - Repeat in 10 years Shingles vaccine: Done 09/18/2016 & 12/30/2016 Covid-19: Done 09/05/2019 & 10/03/2019  Advanced directives: Advance directive discussed with you today. Even though you declined this today, please call our office should you change your mind, and we can give you the proper paperwork for you to fill out.   Conditions/risks identified: Aim for 30 minutes of exercise or brisk walking each day, drink 6-8 glasses of water and eat lots of fruits and vegetables.   Next appointment: Follow up in one year for your annual wellness visit    Preventive Care 65 Years and Older, Female Preventive care refers to lifestyle choices and visits with your health care provider that can promote health and wellness. What does preventive care include? A yearly physical exam. This is also called an annual well check. Dental exams once or twice a year. Routine eye exams. Ask your health care provider how often you should have your eyes checked. Personal lifestyle choices, including: Daily care of your teeth and gums. Regular physical activity. Eating a healthy diet. Avoiding tobacco and drug use. Limiting alcohol use. Practicing safe sex. Taking low-dose  aspirin every day. Taking vitamin and mineral supplements as recommended by your health care provider. What happens during an annual well check? The services and screenings done by your health care provider during your annual well check will depend on your age, overall health, lifestyle risk factors, and family history of disease. Counseling  Your health care provider may ask you questions about your: Alcohol use. Tobacco use. Drug use. Emotional well-being. Home and relationship well-being. Sexual activity. Eating habits. History of falls. Memory and ability to understand (cognition). Work and work Astronomer. Reproductive health. Screening  You may have the following tests or measurements: Height, weight, and BMI. Blood pressure. Lipid and cholesterol levels. These may be checked every 5 years, or more frequently if you are over 13 years old. Skin check. Lung cancer screening. You may have this screening every year starting at age 24 if you have a 30-pack-year history of smoking and currently smoke or have quit within the past 15 years. Fecal occult blood test (FOBT) of the stool. You may have this test every year starting at age 79. Flexible sigmoidoscopy or colonoscopy. You may have a sigmoidoscopy every 5 years or a colonoscopy every 10 years starting at age 35. Hepatitis C blood test. Hepatitis B blood test. Sexually transmitted disease (STD) testing. Diabetes screening. This is done by checking your blood sugar (glucose) after you have not eaten for a while (fasting). You may have this done every 1-3 years. Bone density scan. This is done to screen for osteoporosis. You may have this done starting at age 70. Mammogram. This may be done every 1-2 years. Talk to your health care provider about how often you should have regular mammograms.  Talk with your health care provider about your test results, treatment options, and if necessary, the need for more tests. Vaccines  Your  health care provider may recommend certain vaccines, such as: Influenza vaccine. This is recommended every year. Tetanus, diphtheria, and acellular pertussis (Tdap, Td) vaccine. You may need a Td booster every 10 years. Zoster vaccine. You may need this after age 31. Pneumococcal 13-valent conjugate (PCV13) vaccine. One dose is recommended after age 66. Pneumococcal polysaccharide (PPSV23) vaccine. One dose is recommended after age 7. Talk to your health care provider about which screenings and vaccines you need and how often you need them. This information is not intended to replace advice given to you by your health care provider. Make sure you discuss any questions you have with your health care provider. Document Released: 06/29/2015 Document Revised: 02/20/2016 Document Reviewed: 04/03/2015 Elsevier Interactive Patient Education  2017 [place] Prevention in the Home Falls can cause injuries. They can happen to people of all ages. There are many things you can do to make your home safe and to help prevent falls. What can I do on the outside of my home? Regularly fix the edges of walkways and driveways and fix any cracks. Remove anything that might make you trip as you walk through a door, such as a raised step or threshold. Trim any bushes or trees on the path to your home. Use bright outdoor lighting. Clear any walking paths of anything that might make someone trip, such as rocks or tools. Regularly check to see if handrails are loose or broken. Make sure that both sides of any steps have handrails. Any raised decks and porches should have guardrails on the edges. Have any leaves, snow, or ice cleared regularly. Use sand or salt on walking paths during winter. Clean up any spills in your garage right away. This includes oil or grease spills. What can I do in the bathroom? Use night lights. Install grab bars by the toilet and in the tub and shower. Do not use towel bars as  grab bars. Use non-skid mats or decals in the tub or shower. If you need to sit down in the shower, use a plastic, non-slip stool. Keep the floor dry. Clean up any water that spills on the floor as soon as it happens. Remove soap buildup in the tub or shower regularly. Attach bath mats securely with double-sided non-slip rug tape. Do not have throw rugs and other things on the floor that can make you trip. What can I do in the bedroom? Use night lights. Make sure that you have a light by your bed that is easy to reach. Do not use any sheets or blankets that are too big for your bed. They should not hang down onto the floor. Have a firm chair that has side arms. You can use this for support while you get dressed. Do not have throw rugs and other things on the floor that can make you trip. What can I do in the kitchen? Clean up any spills right away. Avoid walking on wet floors. Keep items that you use a lot in easy-to-reach places. If you need to reach something above you, use a strong step stool that has a grab bar. Keep electrical cords out of the way. Do not use floor polish or wax that makes floors slippery. If you must use wax, use non-skid floor wax. Do not have throw rugs and other things on the floor that can make  you trip. What can I do with my stairs? Do not leave any items on the stairs. Make sure that there are handrails on both sides of the stairs and use them. Fix handrails that are broken or loose. Make sure that handrails are as long as the stairways. Check any carpeting to make sure that it is firmly attached to the stairs. Fix any carpet that is loose or worn. Avoid having throw rugs at the top or bottom of the stairs. If you do have throw rugs, attach them to the floor with carpet tape. Make sure that you have a light switch at the top of the stairs and the bottom of the stairs. If you do not have them, ask someone to add them for you. What else can I do to help prevent  falls? Wear shoes that: Do not have high heels. Have rubber bottoms. Are comfortable and fit you well. Are closed at the toe. Do not wear sandals. If you use a stepladder: Make sure that it is fully opened. Do not climb a closed stepladder. Make sure that both sides of the stepladder are locked into place. Ask someone to hold it for you, if possible. Clearly mark and make sure that you can see: Any grab bars or handrails. First and last steps. Where the edge of each step is. Use tools that help you move around (mobility aids) if they are needed. These include: Canes. Walkers. Scooters. Crutches. Turn on the lights when you go into a dark area. Replace any light bulbs as soon as they burn out. Set up your furniture so you have a clear path. Avoid moving your furniture around. If any of your floors are uneven, fix them. If there are any pets around you, be aware of where they are. Review your medicines with your doctor. Some medicines can make you feel dizzy. This can increase your chance of falling. Ask your doctor what other things that you can do to help prevent falls. This information is not intended to replace advice given to you by your health care provider. Make sure you discuss any questions you have with your health care provider. Document Released: 03/29/2009 Document Revised: 11/08/2015 Document Reviewed: 07/07/2014 Elsevier Interactive Patient Education  2017 [employer].

## 2021-05-06 NOTE — Progress Notes (Signed)
Subjective:   Tracy Jensen is a 65 y.o. female who presents for an Initial Medicare Annual Wellness Visit.  Virtual Visit via Telephone Note  I connected with  Tracy Jensen on 05/06/21 at  4:15 PM EST by telephone and verified that I am speaking with the correct person using two identifiers.  Location: Patient: Home Provider: [facility] Persons participating in the virtual visit: patient/Nurse Health Advisor   I discussed the limitations, risks, security and privacy concerns of performing an evaluation and management service by telephone and the availability of in person appointments. The patient expressed understanding and agreed to proceed.  Interactive audio and video telecommunications were attempted between this nurse and patient, however failed, due to patient having technical difficulties OR patient did not have access to video capability.  We continued and completed visit with audio only.  Some vital signs may be absent or patient reported.   Shaquera Ansley E Denisa Enterline, LPN   Review of Systems     Cardiac Risk Factors include: advanced age (>19mn, >>62women);hypertension;Other (see comment), Risk factor comments: hx of CVA     Objective:    Today's Vitals   05/06/21 1615  Weight: 112 lb (50.8 kg)  Height: '4\' 9"'  (1.448 m)   Body mass index is 24.24 kg/m.  Advanced Directives 05/06/2021 11/29/2018 08/16/2018 07/11/2018 07/10/2018 01/01/2018  Does Patient Have a Medical Advance Directive? No No No No No No  Would patient like information on creating a medical advance directive? No - Patient declined - - Yes (Inpatient - patient defers creating a medical advance directive at this time) No - Patient declined -    Current Medications (verified) Outpatient Encounter Medications as of 05/06/2021  Medication Sig   alendronate (FOSAMAX) 70 MG tablet Take 1 tablet (70 mg total) by mouth every 7 (seven) days. Take with a full glass of water on an empty stomach.   Ascorbic Acid (VITAMIN C) 100 MG  tablet Take 100 mg by mouth daily.   aspirin EC 81 MG tablet Take 81 mg by mouth daily.   aspirin-acetaminophen-caffeine (EXCEDRIN MIGRAINE) 250-250-65 MG tablet Take by mouth.   busPIRone (BUSPAR) 15 MG tablet Take 1 tablet (15 mg total) by mouth 3 (three) times daily.   Calcium Carb-Cholecalciferol (CALCIUM 1000 + D PO) Take by mouth.   cholecalciferol (VITAMIN D3) 25 MCG (1000 UNIT) tablet Take 1,000 Units by mouth daily.   cyclobenzaprine (FLEXERIL) 10 MG tablet Take 1 tablet (10 mg total) by mouth 3 (three) times daily.   diclofenac (VOLTAREN) 75 MG EC tablet Take 1 tablet (75 mg total) by mouth 2 (two) times daily.   fluticasone (FLONASE) 50 MCG/ACT nasal spray Place 2 sprays into both nostrils daily.   lisinopril (ZESTRIL) 20 MG tablet Take 1 tablet (20 mg total) by mouth daily.   mirabegron ER (MYRBETRIQ) 50 MG TB24 tablet Take 1 tablet (50 mg total) by mouth daily.   polyethylene glycol (MIRALAX / GLYCOLAX) packet Take 17 g by mouth daily as needed for mild constipation or moderate constipation.   traZODone (DESYREL) 50 MG tablet Take 3 tablets (150 mg total) by mouth at bedtime.   zinc gluconate 50 MG tablet Take 50 mg by mouth daily.   No facility-administered encounter medications on file as of 05/06/2021.    Allergies (verified) Azithromycin   History: Past Medical History:  Diagnosis Date   Hypertension    Migraines    Sciatica    TIA (transient ischemic attack)    History  reviewed. No pertinent surgical history. History reviewed. No pertinent family history. Social History   Socioeconomic History   Marital status: Married    Spouse name: Not on file   Number of children: Not on file   Years of education: Not on file   Highest education level: Not on file  Occupational History   Occupation: retired  Tobacco Use   Smoking status: Never   Smokeless tobacco: Never  Vaping Use   Vaping Use: Never used  Substance and Sexual Activity   Alcohol use: Yes     Comment: occ   Drug use: No   Sexual activity: Not on file  Other Topics Concern   Not on file  Social History Narrative   Not on file   Social Determinants of Health   Financial Resource Strain: Low Risk    Difficulty of Paying Living Expenses: Not hard at all  Food Insecurity: No Food Insecurity   Worried About Charity fundraiser in the Last Year: Never true   [place] in the Last Year: Never true  Transportation Needs: No Transportation Needs   Lack of Transportation (Medical): No   Lack of Transportation (Non-Medical): No  Physical Activity: Insufficiently Active   Days of Exercise per Week: 7 days   Minutes of Exercise per Session: 10 min  Stress: No Stress Concern Present   Feeling of Stress : Only a little  Social Connections: Engineer, building services of Communication with Friends and Family: More than three times a week   Frequency of Social Gatherings with Friends and Family: More than three times a week   Attends Religious Services: More than 4 times per year   Active Member of [employer] or Organizations: Yes   Attends Music therapist: More than 4 times per year   Marital Status: Married    Tobacco Counseling Counseling given: Not Answered   Clinical Intake:  Pre-visit preparation completed: Yes  Pain : No/denies pain     BMI - recorded: 24.24 Nutritional Status: BMI of 19-24  Normal Nutritional Risks: None Diabetes: No  How often do you need to have someone help you when you read instructions, pamphlets, or other written materials from your doctor or pharmacy?: 1 - Never  Diabetic? no  Interpreter Needed?: No  Information entered by :: Tanea Moga, LPN   Activities of Daily Living In your present state of health, do you have any difficulty performing the following activities: 05/06/2021  Hearing? N  Vision? N  Difficulty concentrating or making decisions? N  Walking or climbing stairs? N  Dressing or bathing? N   Doing errands, shopping? N  Preparing Food and eating ? N  Using the Toilet? N  In the past six months, have you accidently leaked urine? N  Do you have problems with loss of bowel control? N  Managing your Medications? N  Managing your Finances? N  Housekeeping or managing your Housekeeping? N  Some recent data might be hidden    Patient Care Team: Sharion Balloon, FNP as PCP - General (Family Medicine)  Indicate any recent Medical Services you may have received from other than [facility] providers in the past year (date may be approximate).     Assessment:   This is a routine wellness examination for [employer].  Hearing/Vision screen Hearing Screening - Comments:: Denies hearing difficulties  Vision Screening - Comments:: Wears rx glasses for reading only prn - up to date with annual eye exams with  [facility] in [place]  Dietary issues and exercise activities discussed: Current Exercise Habits: Home exercise routine, Type of exercise: walking;Other - see comments (squats), Time (Minutes): 10, Frequency (Times/Week): 7, Weekly Exercise (Minutes/Week): 70, Intensity: Moderate, Exercise limited by: orthopedic condition(s)   Goals Addressed             This Visit's Progress    Exercise 150 min/wk Moderate Activity         Depression Screen PHQ 2/9 Scores 05/06/2021 03/06/2021 12/12/2020 12/12/2020 07/05/2020 10/03/2019 08/05/2018  PHQ - 2 Score 0 0 0 0 4 0 0  PHQ- 9 Score - 0 0 0 9 1 -    Fall Risk Fall Risk  05/06/2021 03/06/2021 12/12/2020 12/12/2020 10/03/2019  Falls in the past year? 0 0 0 0 0  Number falls in past yr: 0 - - - -  Injury with Fall? 0 - - - -  Risk for fall due to : Orthopedic patient - - - -  Follow up Falls prevention discussed - - - -    FALL [place]:  Any stairs in or around the home? No  If so, are there any without handrails? No  Home free of loose throw rugs in walkways, pet beds, electrical cords, etc? Yes  Adequate  lighting in your home to reduce risk of falls? Yes   ASSISTIVE DEVICES UTILIZED TO PREVENT FALLS:  Life alert? No  Use of a cane, walker or w/c? No  Grab bars in the bathroom? Yes  Shower chair or bench in shower? No  Elevated toilet seat or a handicapped toilet? Yes   TIMED UP AND GO:  Was the test performed? No . Telephonic visit  Cognitive Function:     6CIT Screen 05/06/2021  What Year? 0 points  What month? 0 points  What time? 0 points  Count back from 20 0 points  Months in reverse 0 points  Repeat phrase 0 points  Total Score 0    Immunizations Immunization History  Administered Date(s) Administered   Fluad Quad(high Dose 65+) 03/06/2021   Influenza, High Dose Seasonal PF 03/08/2018   Influenza,inj,Quad PF,6+ Mos 02/17/2019   Influenza-Unspecified 02/11/2017, 02/17/2019   MMR 10/30/2017   Moderna Sars-Covid-2 Vaccination 09/05/2019, 10/03/2019   Pneumococcal Conjugate-13 03/20/2020   Tdap 03/14/2013   Zoster Recombinat (Shingrix) 09/18/2016, 12/30/2016    TDAP status: Up to date  Flu Vaccine status: Up to date  Pneumococcal vaccine status: Due, Education has been provided regarding the importance of this vaccine. Advised may receive this vaccine at local pharmacy or Health Dept. Aware to provide a copy of the vaccination record if obtained from local pharmacy or Health Dept. Verbalized acceptance and understanding.  Covid-19 vaccine status: Completed vaccines  Qualifies for Shingles Vaccine? Yes   Zostavax completed Yes   Shingrix Completed?: Yes  Screening Tests Health Maintenance  Topic Date Due   COVID-19 Vaccine (3 - Booster for Moderna series) 11/28/2019   Pneumonia Vaccine 62+ Years old (2 - PPSV23 if available, else PCV20) 03/20/2021   MAMMOGRAM  11/08/2021   TETANUS/TDAP  03/15/2023   COLONOSCOPY (Pts 45-67yr Insurance coverage will need to be confirmed)  12/21/2023   INFLUENZA VACCINE  Completed   DEXA SCAN  Completed   Hepatitis C  Screening  Completed   Zoster Vaccines- Shingrix  Completed   HPV VACCINES  Aged Out    Health Maintenance  Health Maintenance Due  Topic Date Due   COVID-19 Vaccine (  3 - Booster for Moderna series) 11/28/2019   Pneumonia Vaccine 55+ Years old (2 - PPSV23 if available, else PCV20) 03/20/2021    Colorectal cancer screening: Type of screening: Colonoscopy. Completed 12/20/2013. Repeat every 10 years  Mammogram status: Ordered 05/06/21. Pt provided with contact info and advised to call to schedule appt.   Bone Density status: Completed 03/06/2021. Results reflect: Bone density results: OSTEOPOROSIS. Repeat every 2 years.  Lung Cancer Screening: (Low Dose CT Chest recommended if Age 34-80 years, 30 pack-year currently smoking OR have quit w/in 15years.) does not qualify.   Additional Screening:  Hepatitis C Screening: does qualify; Completed 10/03/2019  Vision Screening: Recommended annual ophthalmology exams for early detection of glaucoma and other disorders of the eye. Is the patient up to date with their annual eye exam?  Yes  Who is the provider or what is the name of the office in which the patient attends annual eye exams? [place] If pt is not established with a provider, would they like to be referred to a provider to establish care? No .   Dental Screening: Recommended annual dental exams for proper oral hygiene  Community Resource Referral / Chronic Care Management: CRR required this visit?  No   CCM required this visit?  No      Plan:     I have personally reviewed and noted the following in the patient's chart:   Medical and social history Use of alcohol, tobacco or illicit drugs  Current medications and supplements including opioid prescriptions. Patient is not currently taking opioid prescriptions. Functional ability and status Nutritional status Physical activity Advanced directives List of other physicians Hospitalizations, surgeries, and  ER visits in previous 12 months Vitals Screenings to include cognitive, depression, and falls Referrals and appointments  In addition, I have reviewed and discussed with patient certain preventive protocols, quality metrics, and best practice recommendations. A written personalized care plan for preventive services as well as general preventive health recommendations were provided to patient.     Sandrea Hammond, LPN   [id number]   Nurse Notes: none

## 2021-05-08 ENCOUNTER — Other Ambulatory Visit: Payer: Self-pay | Admitting: Family

## 2021-05-08 DIAGNOSIS — F411 Generalized anxiety disorder: Secondary | ICD-10-CM

## 2021-06-20 ENCOUNTER — Other Ambulatory Visit: Payer: Self-pay | Admitting: Family

## 2021-07-15 ENCOUNTER — Ambulatory Visit
Admission: RE | Admit: 2021-07-15 | Discharge: 2021-07-15 | Disposition: A | Discharge status: Home / Self Care | Payer: Medicare Other | Source: Ambulatory Visit | Attending: Family | Admitting: Family

## 2021-07-15 DIAGNOSIS — Z1231 Encounter for screening mammogram for malignant neoplasm of breast: Secondary | ICD-10-CM | POA: Diagnosis not present

## 2021-07-23 DIAGNOSIS — H10013 Acute follicular conjunctivitis, bilateral: Secondary | ICD-10-CM | POA: Diagnosis not present

## 2021-08-05 ENCOUNTER — Other Ambulatory Visit: Payer: Self-pay | Admitting: Family

## 2021-08-05 DIAGNOSIS — I10 Essential (primary) hypertension: Secondary | ICD-10-CM

## 2021-08-05 DIAGNOSIS — G8929 Other chronic pain: Secondary | ICD-10-CM

## 2021-08-05 DIAGNOSIS — M25511 Pain in right shoulder: Secondary | ICD-10-CM

## 2021-08-05 DIAGNOSIS — M159 Polyosteoarthritis, unspecified: Secondary | ICD-10-CM

## 2021-08-15 ENCOUNTER — Other Ambulatory Visit: Payer: Self-pay

## 2021-08-15 MED ORDER — ALENDRONATE SODIUM 70 MG PO TABS: 70.0000 mg | ORAL_TABLET | ORAL | 1 refills | Status: DC

## 2021-09-03 ENCOUNTER — Encounter: Payer: Self-pay | Admitting: Family

## 2021-09-03 ENCOUNTER — Ambulatory Visit (INDEPENDENT_AMBULATORY_CARE_PROVIDER_SITE_OTHER): Payer: Medicare Other | Admitting: Family

## 2021-09-03 VITALS — BP 115/76 | HR 84 | Temp 97.5°F | Ht <= 58 in | Wt 108.8 lb

## 2021-09-03 DIAGNOSIS — M5441 Lumbago with sciatica, right side: Secondary | ICD-10-CM | POA: Diagnosis not present

## 2021-09-03 DIAGNOSIS — G4700 Insomnia, unspecified: Secondary | ICD-10-CM

## 2021-09-03 DIAGNOSIS — N3281 Overactive bladder: Secondary | ICD-10-CM

## 2021-09-03 DIAGNOSIS — F411 Generalized anxiety disorder: Secondary | ICD-10-CM

## 2021-09-03 DIAGNOSIS — G8929 Other chronic pain: Secondary | ICD-10-CM | POA: Diagnosis not present

## 2021-09-03 DIAGNOSIS — M159 Polyosteoarthritis, unspecified: Secondary | ICD-10-CM

## 2021-09-03 DIAGNOSIS — I10 Essential (primary) hypertension: Secondary | ICD-10-CM

## 2021-09-03 DIAGNOSIS — K5900 Constipation, unspecified: Secondary | ICD-10-CM | POA: Diagnosis not present

## 2021-09-03 DIAGNOSIS — F320 Major depressive disorder, single episode, mild: Secondary | ICD-10-CM

## 2021-09-03 DIAGNOSIS — M25511 Pain in right shoulder: Secondary | ICD-10-CM

## 2021-09-03 MED ORDER — BUSPIRONE HCL 15 MG PO TABS: 15.0000 mg | ORAL_TABLET | Freq: Three times a day (TID) | ORAL | 3 refills | Status: DC

## 2021-09-03 MED ORDER — CYCLOBENZAPRINE HCL 10 MG PO TABS: 10.0000 mg | ORAL_TABLET | Freq: Three times a day (TID) | ORAL | 3 refills | Status: DC

## 2021-09-03 MED ORDER — DICLOFENAC SODIUM 75 MG PO TBEC: 75.0000 mg | DELAYED_RELEASE_TABLET | Freq: Two times a day (BID) | ORAL | 0 refills | Status: DC

## 2021-09-03 MED ORDER — LISINOPRIL 20 MG PO TABS: 20.0000 mg | ORAL_TABLET | Freq: Every day | ORAL | 3 refills | Status: DC

## 2021-09-03 MED ORDER — TRAZODONE HCL 50 MG PO TABS: 150.0000 mg | ORAL_TABLET | Freq: Every day | ORAL | 5 refills | Status: DC

## 2021-09-03 MED ORDER — MIRABEGRON ER 50 MG PO TB24: 50.0000 mg | ORAL_TABLET | Freq: Every day | ORAL | 3 refills | Status: DC

## 2021-09-03 NOTE — Patient Instructions (Signed)
Urinary Frequency, Adult ?Urinary frequency means urinating more often than usual. You may urinate every 1-2 hours even though you drink a normal amount of fluid and do not have a bladder infection or condition. Although you urinate more often than normal, the total amount of urine produced in a day is normal. ?With urinary frequency, you may have an urgent need to urinate often. The stress and anxiety of needing to find a bathroom quickly can make this urge worse. This condition may go away on its own, or you may need treatment at home. Home treatment may include bladder training, exercises, taking medicines, or making changes to your diet. ?Follow these instructions at home: ?Bladder health ?Your health care provider will tell you what to do to improve bladder health. You may be told to: ?Keep a bladder diary. Keep track of: ?What you eat and drink. ?How often you urinate. ?How much you urinate. ?Follow a bladder training program. This may include: ?Learning to delay going to the bathroom. ?Double urinating, also called voiding. This helps if you are not completely emptying your bladder. ?Scheduled voiding. ?Do Kegel exercises. Kegel exercises strengthen the muscles that help control urination, which may help the condition. ? ?Eating and drinking ?Follow instructions from your health care provider about eating or drinking restrictions. You may be told to: ?Avoid caffeine. ?Drink fewer fluids, especially alcohol. ?Avoid drinking in the evening. ?Avoid foods or drinks that may irritate the bladder. These include coffee, tea, soda, artificial sweeteners, citrus, tomato-based foods, and chocolate. ?Eat foods that help prevent or treat constipation. Constipation can make urinary frequency worse. You may need to take these actions to prevent or treat constipation: ?Drink enough fluid to keep your urine pale yellow. ?Take over-the-counter or prescription medicines. ?Eat foods that are high in fiber, such as beans, whole  grains, and fresh fruits and vegetables. ?Limit foods that are high in fat and processed sugars, such as fried or sweet foods. ?General instructions ?Take over-the-counter and prescription medicines only as told by your health care provider. ?Keep all follow-up visits. This is important. ?Contact a health care provider if: ?You start urinating more often. ?You feel pain or irritation when you urinate. ?You notice blood in your urine. ?Your urine looks cloudy. ?You develop a fever. ?You begin vomiting. ?Get help right away if: ?You are unable to urinate. ?Summary ?Urinary frequency means urinating more often than usual. With urinary frequency, you may urinate every 1-2 hours even though you drink a normal amount of fluid and do not have a bladder infection or other bladder condition. ?Your health care provider may recommend that you keep a bladder diary, follow a bladder training program, or make dietary changes. ?If told by your health care provider, do Kegel exercises to strengthen the muscles that help control urination. ?Take over-the-counter and prescription medicines only as told by your health care provider. ?Contact a health care provider if your symptoms do not improve or get worse. ?This information is not intended to replace advice given to you by your health care provider. Make sure you discuss any questions you have with your health care provider. ?Document Revised: 01/06/2020 Document Reviewed: 01/06/2020 ?Elsevier Patient Education ? 2022 Elsevier Inc[employer] ? ?

## 2021-09-03 NOTE — Progress Notes (Signed)
? ?Subjective:  ? ? Patient ID: Tracy Jensen, female    DOB: 04-15-1956, 66 y.o.   MRN: [id number] ? ?Chief Complaint  ?Patient presents with  ? Medical Management of Chronic Issues  ? ?Pt calls to the office today for chronic follow up. ?  ?She has osteoporosis and takes Fosamax weekly. Her last dexa scan was 03/06/21.  ? ?   ?Hypertension ?This is a chronic problem. The current episode started more than 1 year ago. The problem has been resolved since onset. The problem is controlled. Associated symptoms include anxiety. Pertinent negatives include no malaise/fatigue, peripheral edema or shortness of breath. Risk factors for coronary artery disease include dyslipidemia and sedentary lifestyle. The current treatment provides moderate improvement. There is no history of heart failure.  ?Back Pain ?This is a chronic problem. The current episode started more than 1 year ago. The problem occurs intermittently. The problem has been waxing and waning since onset. The pain is present in the thoracic spine. The quality of the pain is described as aching. The pain is at a severity of 3/10. The pain is moderate. The symptoms are aggravated by twisting. Risk factors include menopause, poor posture and sedentary lifestyle. She has tried bed rest for the symptoms. The treatment provided mild relief.  ?Arthritis ?Presents for follow-up visit. She complains of pain and stiffness. Affected locations include the left knee, right knee, left MCP and right MCP. Her pain is at a severity of 3/10.  ?Anxiety ?Presents for follow-up visit. Symptoms include excessive worry, insomnia, irritability and nervous/anxious behavior. Patient reports no shortness of breath. Symptoms occur occasionally. The severity of symptoms is moderate.  ? ? ?Depression ?       This is a chronic problem.  The current episode started more than 1 year ago.   The onset quality is gradual.   The problem occurs intermittently.  Associated symptoms include insomnia.   Associated symptoms include no helplessness, no hopelessness, not irritable, no myalgias and not sad.  Past medical history includes anxiety.   ?Constipation ?This is a chronic problem. The current episode started more than 1 year ago. The problem has been resolved since onset. Her stool frequency is 4 to 5 times per week. Associated symptoms include back pain. She has tried laxatives for the symptoms. The treatment provided moderate relief.  ?Insomnia ?Primary symptoms: difficulty falling asleep, frequent awakening, no malaise/fatigue.   ?The current episode started more than one year. The onset quality is gradual. The problem occurs intermittently. PMH includes: depression.   ?Urinary Frequency  ?This is a chronic problem. The current episode started more than 1 year ago. The problem occurs intermittently. The problem has been waxing and waning. The patient is experiencing no pain. Associated symptoms include frequency.  ? ? ? ?Review of Systems  ?Constitutional:  Positive for irritability. Negative for malaise/fatigue.  ?Respiratory:  Negative for shortness of breath.   ?Gastrointestinal:  Positive for constipation.  ?Genitourinary:  Positive for frequency.  ?Musculoskeletal:  Positive for arthritis, back pain and stiffness. Negative for myalgias.  ?Psychiatric/Behavioral:  Positive for depression. The patient is nervous/anxious and has insomnia.   ?All other systems reviewed and are negative. ? ?   ?Objective:  ? Physical Exam ?Vitals reviewed.  ?Constitutional:   ?   General: She is not irritable.She is not in acute distress. ?   Appearance: She is well-developed.  ?HENT:  ?   Head: Normocephalic and atraumatic.  ?   Right Ear: Tympanic membrane normal.  ?  Left Ear: Tympanic membrane normal.  ?Eyes:  ?   Pupils: Pupils are equal, round, and reactive to light.  ?Neck:  ?   Thyroid: No thyromegaly.  ?Cardiovascular:  ?   Rate and Rhythm: Normal rate and regular rhythm.  ?   Heart sounds: Normal heart sounds. No  murmur heard. ?Pulmonary:  ?   Effort: Pulmonary effort is normal. No respiratory distress.  ?   Breath sounds: Normal breath sounds. No wheezing.  ?Abdominal:  ?   General: Bowel sounds are normal. There is no distension.  ?   Palpations: Abdomen is soft.  ?   Tenderness: There is no abdominal tenderness.  ?Musculoskeletal:     ?   General: No tenderness. Normal range of motion.  ?   Cervical back: Normal range of motion and neck supple.  ?Skin: ?   General: Skin is warm and dry.  ?Neurological:  ?   Mental Status: She is alert and oriented to person, place, and time.  ?   Cranial Nerves: No cranial nerve deficit.  ?   Deep Tendon Reflexes: Reflexes are normal and symmetric.  ?Psychiatric:     ?   Behavior: Behavior normal.     ?   Thought Content: Thought content normal.     ?   Judgment: Judgment normal.  ? ? ? ? ?BP 115/76   Pulse 84   Temp (!) 97.5 ?F (36.4 ?C) (Temporal)   Ht _0  (1.448 m)   Wt 108 lb 12.8 oz (49.4 kg)   BMI 23.54 kg/m?  ? ?   ?Assessment & Plan:  ?Tracy Jensen comes in today with chief complaint of Medical Management of Chronic Issues ? ? ?Diagnosis and orders addressed: ? ?1. Chronic right shoulder pain ?- diclofenac (VOLTAREN) 75 MG EC tablet; Take 1 tablet (75 mg total) by mouth 2 (two) times daily. (NEEDS TO BE SEEN BEFORE NEXT REFILL)  Dispense: 60 tablet; Refill: 0 ?- CMP14+EGFR ? ?2. Primary osteoarthritis involving multiple joints ?- diclofenac (VOLTAREN) 75 MG EC tablet; Take 1 tablet (75 mg total) by mouth 2 (two) times daily. (NEEDS TO BE SEEN BEFORE NEXT REFILL)  Dispense: 60 tablet; Refill: 0 ?- CMP14+EGFR ? ?3. GAD (generalized anxiety disorder) ?- busPIRone (BUSPAR) 15 MG tablet; Take 1 tablet (15 mg total) by mouth 3 (three) times daily.  Dispense: 270 tablet; Refill: 3 ?- CMP14+EGFR ? ?4. HTN (hypertension), benign ?- lisinopril (ZESTRIL) 20 MG tablet; Take 1 tablet (20 mg total) by mouth daily. (NEEDS TO BE SEEN BEFORE NEXT REFILL)  Dispense: 90 tablet; Refill: 3 ?-  CMP14+EGFR ? ?5. OAB (overactive bladder) ?- mirabegron ER (MYRBETRIQ) 50 MG TB24 tablet; Take 1 tablet (50 mg total) by mouth daily.  Dispense: 90 tablet; Refill: 3 ?- CMP14+EGFR ? ?6. Constipation, unspecified constipation type ?- CMP14+EGFR ? ?7. Current mild episode of major depressive disorder without prior episode ([place]) ?- CMP14+EGFR ? ?8. Insomnia, unspecified type ?- traZODone (DESYREL) 50 MG tablet; Take 3 tablets (150 mg total) by mouth at bedtime.  Dispense: 90 tablet; Refill: 5 ?- CMP14+EGFR ? ?9. Chronic right-sided low back pain with right-sided sciatica ?- CMP14+EGFR ? ? ?Labs pending ?Health Maintenance reviewed ?Diet and exercise encouraged ? ?Follow up plan: ?6  months  ? ? ?Evelina Dun, FNP ? ? ? ?

## 2021-09-04 LAB — CMP14+EGFR
ALT: 16 IU/L (ref 0–32)
AST: 21 IU/L (ref 0–40)
Albumin/Globulin Ratio: 1.9 (ref 1.2–2.2)
Albumin: 4.4 g/dL (ref 3.8–4.8)
Alkaline Phosphatase: 67 IU/L (ref 44–121)
BUN/Creatinine Ratio: 22 (ref 12–28)
BUN: 19 mg/dL (ref 8–27)
Bilirubin Total: <0.2 mg/dL (ref 0.0–1.2)
CO2: 27 mmol/L (ref 20–29)
Calcium: 9.7 mg/dL (ref 8.7–10.3)
Chloride: 102 mmol/L (ref 96–106)
Creatinine, Ser: 0.87 mg/dL (ref 0.57–1.00)
Globulin, Total: 2.3 g/dL (ref 1.5–4.5)
Glucose: 87 mg/dL (ref 70–99)
Potassium: 4.6 mmol/L (ref 3.5–5.2)
Sodium: 144 mmol/L (ref 134–144)
Total Protein: 6.7 g/dL (ref 6.0–8.5)
eGFR: 73 mL/min/{1.73_m2} (ref >59)

## 2021-10-04 DIAGNOSIS — M25552 Pain in left hip: Secondary | ICD-10-CM | POA: Diagnosis not present

## 2021-10-11 ENCOUNTER — Encounter: Payer: Self-pay | Admitting: Family

## 2021-10-11 ENCOUNTER — Ambulatory Visit (INDEPENDENT_AMBULATORY_CARE_PROVIDER_SITE_OTHER): Payer: Medicare Other | Admitting: Family

## 2021-10-11 VITALS — BP 110/74 | HR 83 | Temp 97.7°F | Ht <= 58 in | Wt 111.0 lb

## 2021-10-11 DIAGNOSIS — K922 Gastrointestinal hemorrhage, unspecified: Secondary | ICD-10-CM

## 2021-10-11 DIAGNOSIS — K921 Melena: Secondary | ICD-10-CM

## 2021-10-11 NOTE — Patient Instructions (Signed)
Gastrointestinal Bleeding Gastrointestinal (GI) bleeding is bleeding somewhere along the digestive tract, between the mouth and the anus. The digestive tract includes the mouth, esophagus, stomach, small intestine, large intestine, and anus. The large intestine is often called the colon. GI bleeding can be caused by various problems. The severity of these problems can be mild, serious, or life-threatening. If you have GI bleeding, you may find blood in your stools (feces), you may have black stools, or you may vomit blood. You may need to stay in the hospital if there is a lot of bleeding. What are the causes? This condition may be caused by: Inflammation, irritation, or swelling of the esophagus (esophagitis). The esophagus is part of the body that moves food from your mouth to your stomach. Swollen veins in the rectum (hemorrhoids). Tears in the anus (anal fissures). The tears are often caused by passing hard stool. Pouches that form on the colon and may bleed (diverticulosis). Inflammation in areas with diverticulosis. This is called diverticulitis.This can cause pain, fever, and bloody stools. Growths (polyps) or cancer. Colon cancer often starts out as precancerous polyps. Gastritis and ulcers. These may cause bleeding in the upper GI tract, near the stomach. What increases the risk? You are more likely to develop this condition if: You have an infection in your stomach from a type of bacteria called Helicobacter pylori. You take certain medicines, such as: NSAIDs. Aspirin. Selective serotonin reuptake inhibitors (SSRIs). Steroids. Antiplatelet or anticoagulant medicines. You smoke. You drink alcohol. What are the signs or symptoms? Common symptoms of this condition include: Bright red blood in your vomit, or vomit that looks like coffee grounds. Bloody, black, or tarry stools. Bleeding from the lower GI tract will usually cause red or maroon blood in the stools. Bleeding from the  upper GI tract may cause black, tarry stools that are often stronger smelling than usual. In certain cases, if the bleeding is fast enough, the stools may be red. Pain or cramping in the abdomen. How is this diagnosed? This condition may be diagnosed based on: Your medical history and a physical exam. Various tests, such as: Blood tests. Stool tests. X-rays and other imaging tests. Esophagogastroduodenoscopy (EGD). In this test, a flexible, lighted tube is used to look at your esophagus, stomach, and small intestine. Colonoscopy. In this test, a flexible, lighted tube is used to look at your colon. How is this treated? Treatment for this condition depends on the cause of the bleeding. For example: For bleeding from the esophagus, stomach, small intestine, or colon, the health care provider may do a procedure to stop bleeding during your EGD or colonoscopy. Inflammation or infection of the colon can be treated with medicines. Certain rectal problems can be treated with creams, suppositories, or warm baths. Medicines may be given to reduce acid in your stomach. Surgery is sometimes done. Blood transfusions are sometimes needed if a lot of blood has been lost. If there is a lot of bleeding, you will need to stay in the hospital for observation. If bleeding is mild, you may be allowed to go home. Follow these instructions at home:  Take over-the-counter and prescription medicines only as told by your health care provider. Eat foods that are high in fiber, such as beans, whole grains, and fresh fruits and vegetables. This will help to keep your stools soft. Eating 1-3 prunes each day works well for many people. Drink enough fluid to keep your urine pale yellow. Keep all follow-up visits. This is important. Contact a   health care provider if: Your symptoms do not improve with treatment. Get help right away if: Your bleeding does not stop. You feel light-headed or you faint. You feel  weak. You have severe cramps in your back or abdomen. You pass large blood clots in your stool. Your symptoms are getting worse. You have chest pain or fast heartbeats. These symptoms may be an emergency. Get help right away. Call 911. Do not wait to see if the symptoms will go away. Do not drive yourself to the hospital. Summary Gastrointestinal (GI) bleeding is bleeding somewhere along the digestive tract, between the mouth and anus. GI bleeding can be caused by various problems. Treatment for this condition depends on the cause of the bleeding. Take over-the-counter and prescription medicines only as told by your health care provider. Get help right away if your bleeding increases, your symptoms are getting worse, or you have new symptoms. Keep all follow-up visits. This is important. This information is not intended to replace advice given to you by your health care provider. Make sure you discuss any questions you have with your health care provider. Document Revised: 01/04/2021 Document Reviewed: 01/04/2021 Elsevier Patient Education  2023 Elsevier Inc.  

## 2021-10-11 NOTE — Progress Notes (Signed)
? ?Subjective:  ? ? Patient ID: Tracy Jensen, female    DOB: March 30, 1956, 66 y.o.   MRN: [id number] ? ?Chief Complaint  ?Patient presents with  ? Blood In Stools  ?  Patient states that she has seen blood in her stool since Monday.   ? ?PT presents to the office today with complaints of dark red blood in stools since Monday. States she had greasy food on Monday. Reports Monday she did have several BM's on Monday evening after eating the greasy food. Denies any abdominal pain, fever, nausea, vomiting, or active hemorrhoids. States she does have a hemorrhoid, but it is not "flared up".  ?Rectal Bleeding  ?The current episode started 5 to 7 days ago. The problem occurs occasionally. Associated symptoms include hemorrhoids. Pertinent negatives include no fever, no abdominal pain, no diarrhea, no hematemesis, no nausea, no rectal pain, no vomiting, no hematuria, no vaginal bleeding and no vaginal discharge.  ? ? ? ?Review of Systems  ?Constitutional:  Negative for fever.  ?Gastrointestinal:  Positive for hematochezia and hemorrhoids. Negative for abdominal pain, diarrhea, hematemesis, nausea, rectal pain and vomiting.  ?Genitourinary:  Negative for hematuria, vaginal bleeding and vaginal discharge.  ?All other systems reviewed and are negative. ? ?   ?Objective:  ? Physical Exam ?Vitals reviewed.  ?Constitutional:   ?   General: She is not in acute distress. ?   Appearance: She is well-developed.  ?HENT:  ?   Head: Normocephalic and atraumatic.  ?Eyes:  ?   Pupils: Pupils are equal, round, and reactive to light.  ?Neck:  ?   Thyroid: No thyromegaly.  ?Cardiovascular:  ?   Rate and Rhythm: Normal rate and regular rhythm.  ?   Heart sounds: Normal heart sounds. No murmur heard. ?Pulmonary:  ?   Effort: Pulmonary effort is normal. No respiratory distress.  ?   Breath sounds: Normal breath sounds. No wheezing.  ?Abdominal:  ?   General: Bowel sounds are normal. There is no distension.  ?   Palpations: Abdomen is soft.  ?    Tenderness: There is no abdominal tenderness.  ?Musculoskeletal:     ?   General: No tenderness. Normal range of motion.  ?   Cervical back: Normal range of motion and neck supple.  ?Skin: ?   General: Skin is warm and dry.  ?Neurological:  ?   Mental Status: She is alert and oriented to person, place, and time.  ?   Cranial Nerves: No cranial nerve deficit.  ?   Deep Tendon Reflexes: Reflexes are normal and symmetric.  ?Psychiatric:     ?   Behavior: Behavior normal.     ?   Thought Content: Thought content normal.     ?   Judgment: Judgment normal.  ? ? ? ?BP 110/74   Pulse 83   Temp 97.7 ?F (36.5 ?C) (Temporal)   Ht 4\' 9"  (1.448 m)   Wt 111 lb (50.3 kg)   BMI 24.02 kg/m?  ? ? ?   ?Assessment & Plan:  ?Tracy Jensen comes in today with chief complaint of Blood In Stools (Patient states that she has seen blood in her stool since Monday. ) ? ? ?Diagnosis and orders addressed: ? ?1. Blood in stool ?- CBC with Differential/Platelet ?- Fecal occult blood, imunochemical; Future ?- Ambulatory referral to Gastroenterology ? ?2. Gastrointestinal hemorrhage, unspecified gastrointestinal hemorrhage type ?- Ambulatory referral to Gastroenterology ? ? ?Labs pending ?FOBT pending  ?Referral to GI pending  ?  Avoid red food  ?Force fluid ?Avoid straining  ? ?Jannifer Rodney, FNP ? ? ? ?

## 2021-10-14 ENCOUNTER — Other Ambulatory Visit: Payer: Self-pay | Admitting: Family

## 2021-10-14 ENCOUNTER — Other Ambulatory Visit: Payer: Medicare Other

## 2021-10-14 ENCOUNTER — Telehealth: Payer: Self-pay

## 2021-10-14 DIAGNOSIS — K921 Melena: Secondary | ICD-10-CM

## 2021-10-14 DIAGNOSIS — K922 Gastrointestinal hemorrhage, unspecified: Secondary | ICD-10-CM

## 2021-10-14 LAB — HEMOGLOBIN, FINGERSTICK: Hemoglobin: 8.2 g/dL — ABNORMAL LOW (ref 11.1–15.9)

## 2021-10-14 LAB — CBC WITH DIFFERENTIAL/PLATELET
Basophils Absolute: 0.1 10*3/uL (ref 0.0–0.2)
Basos: 1 %
EOS (ABSOLUTE): 0.3 10*3/uL (ref 0.0–0.4)
Eos: 3 %
Hematocrit: 23.5 % — ABNORMAL LOW (ref 34.0–46.6)
Hemoglobin: 7.9 g/dL — CL (ref 11.1–15.9)
Immature Grans (Abs): 0.1 10*3/uL (ref 0.0–0.1)
Immature Granulocytes: 1 %
Lymphocytes Absolute: 2.8 10*3/uL (ref 0.7–3.1)
Lymphs: 26 %
MCH: 30.9 pg (ref 26.6–33.0)
MCHC: 33.6 g/dL (ref 31.5–35.7)
MCV: 92 fL (ref 79–97)
Monocytes Absolute: 0.8 10*3/uL (ref 0.1–0.9)
Monocytes: 8 %
Neutrophils Absolute: 6.6 10*3/uL (ref 1.4–7.0)
Neutrophils: 61 %
Platelets: 372 10*3/uL (ref 150–450)
RBC: 2.56 x10E6/uL — CL (ref 3.77–5.28)
RDW: 11.4 % — ABNORMAL LOW (ref 11.7–15.4)
WBC: 10.7 10*3/uL (ref 3.4–10.8)

## 2021-10-14 NOTE — Telephone Encounter (Signed)
Tracy Jensen called from [facility] and informed of critical Hgb. Pt has been made aware of labs. She came to lab today for repeat. Tracy Jensen is aware of all.  ?

## 2021-10-15 LAB — CBC WITH DIFFERENTIAL/PLATELET
Basophils Absolute: 0.1 10*3/uL (ref 0.0–0.2)
Basos: 1 %
EOS (ABSOLUTE): 0.4 10*3/uL (ref 0.0–0.4)
Eos: 3 %
Hematocrit: 25.5 % — ABNORMAL LOW (ref 34.0–46.6)
Hemoglobin: 8.4 g/dL — CL (ref 11.1–15.9)
Immature Grans (Abs): 0 10*3/uL (ref 0.0–0.1)
Immature Granulocytes: 0 %
Lymphocytes Absolute: 1.8 10*3/uL (ref 0.7–3.1)
Lymphs: 16 %
MCH: 30.5 pg (ref 26.6–33.0)
MCHC: 32.9 g/dL (ref 31.5–35.7)
MCV: 93 fL (ref 79–97)
Monocytes Absolute: 0.7 10*3/uL (ref 0.1–0.9)
Monocytes: 6 %
Neutrophils Absolute: 8.4 10*3/uL — ABNORMAL HIGH (ref 1.4–7.0)
Neutrophils: 74 %
Platelets: 425 10*3/uL (ref 150–450)
RBC: 2.75 x10E6/uL — ABNORMAL LOW (ref 3.77–5.28)
RDW: 11.2 % — ABNORMAL LOW (ref 11.7–15.4)
WBC: 11.4 10*3/uL — ABNORMAL HIGH (ref 3.4–10.8)

## 2021-10-15 LAB — FECAL OCCULT BLOOD, IMMUNOCHEMICAL: Fecal Occult Bld: POSITIVE — AB

## 2021-10-16 ENCOUNTER — Encounter: Payer: Self-pay | Admitting: Nurse Practitioner

## 2021-10-17 ENCOUNTER — Encounter: Payer: Self-pay | Admitting: Family

## 2021-10-17 ENCOUNTER — Ambulatory Visit (INDEPENDENT_AMBULATORY_CARE_PROVIDER_SITE_OTHER): Payer: Medicare Other | Admitting: Family

## 2021-10-17 VITALS — BP 109/70 | HR 80 | Temp 97.2°F | Resp 16 | Ht <= 58 in | Wt 108.0 lb

## 2021-10-17 DIAGNOSIS — D500 Iron deficiency anemia secondary to blood loss (chronic): Secondary | ICD-10-CM

## 2021-10-17 DIAGNOSIS — K921 Melena: Secondary | ICD-10-CM

## 2021-10-17 LAB — HEMOGLOBIN, FINGERSTICK: Hemoglobin: 7.6 g/dL — ABNORMAL LOW (ref 11.1–15.9)

## 2021-10-17 NOTE — Progress Notes (Signed)
? ?  Subjective:  ? ? Patient ID: Tracy Jensen, female    DOB: 22-Feb-1956, 66 y.o.   MRN: [id number] ? ?Chief Complaint  ?Patient presents with  ? Follow-up  ?  Low Hemoglobin  ? ? ?HPI ?Pt presents to the office today to recheck hgb. She was seen last week and found to have a hgb of 7.9. Positive FOBT. We placed a referral to GI. We recheck this and it went to 8.4.  ? ?Today her Hgb is 7.6. She reports the bleeding has slowed down and her BM this morning her stool was "normal". She states her fatigue is slightly improved, but still does not feel great. She was able to house chores yesterday, but afterwards felt SOB.  ? ? ?Review of Systems  ?Constitutional:  Positive for fatigue.  ?All other systems reviewed and are negative. ? ?   ?Objective:  ? Physical Exam ?Vitals reviewed.  ?Constitutional:   ?   General: She is not in acute distress. ?   Appearance: She is well-developed.  ?HENT:  ?   Head: Normocephalic and atraumatic.  ?Eyes:  ?   Pupils: Pupils are equal, round, and reactive to light.  ?Neck:  ?   Thyroid: No thyromegaly.  ?Cardiovascular:  ?   Rate and Rhythm: Normal rate and regular rhythm.  ?   Heart sounds: Normal heart sounds. No murmur heard. ?Pulmonary:  ?   Effort: Pulmonary effort is normal. No respiratory distress.  ?   Breath sounds: Normal breath sounds. No wheezing.  ?Abdominal:  ?   General: Bowel sounds are normal. There is no distension.  ?   Palpations: Abdomen is soft.  ?   Tenderness: There is no abdominal tenderness.  ?Musculoskeletal:     ?   General: No tenderness. Normal range of motion.  ?   Cervical back: Normal range of motion and neck supple.  ?Skin: ?   General: Skin is warm and dry.  ?Neurological:  ?   Mental Status: She is alert and oriented to person, place, and time.  ?   Cranial Nerves: No cranial nerve deficit.  ?   Deep Tendon Reflexes: Reflexes are normal and symmetric.  ?Psychiatric:     ?   Behavior: Behavior normal.     ?   Thought Content: Thought content normal.     ?    Judgment: Judgment normal.  ? ? ? ?BP 109/70   Pulse 80   Temp (!) 97.2 ?F (36.2 ?C)   Resp 16   Ht 4\' 9"  (1.448 m)   Wt 108 lb (49 kg)   SpO2 100%   BMI 23.37 kg/m?  ? ? ?   ?Assessment & Plan:  ?Tracy Jensen comes in today with chief complaint of Follow-up (Low Hemoglobin) ? ? ?Diagnosis and orders addressed: ? ?1. Gastrointestinal hemorrhage with melena ?- CBC with Differential/Platelet ?- Hemoglobin, fingerstick ? ?2. Iron deficiency anemia due to chronic blood loss ? ? ?Stop aspirin, diclofenac, and NSAIDs ?Iron rich diet discussed ?Keep follow up with GI  ?Follow on Monday to recheck hgb ? ?Tuesday, FNP ? ? ?

## 2021-10-17 NOTE — Patient Instructions (Addendum)
Iron-Rich Diet  Iron is a mineral that helps your body produce hemoglobin. Hemoglobin is a protein in red blood cells that carries oxygen to your body's tissues. Eating too little iron may cause you to feel weak and tired, and it can increase your risk of infection. Iron is naturally found in many foods, and many foods have iron added to them (are iron-fortified). You may need to follow an iron-rich diet if you do not have enough iron in your body due to certain medical conditions. The amount of iron that you need each day depends on your age, your sex, and any medical conditions you have. Follow instructions from your health care provider or a dietitian about how much iron you should eat each day. What are tips for following this plan? Reading food labels Check food labels to see how many milligrams (mg) of iron are in each serving. Cooking Cook foods in pots and pans that are made from iron. Take these steps to make it easier for your body to absorb iron from certain foods: Soak beans overnight before cooking. Soak whole grains overnight and drain them before using. Ferment flours before baking, such as by using yeast in bread dough. Meal planning When you eat foods that contain iron, you should eat them with foods that are high in vitamin C. These include oranges, peppers, tomatoes, potatoes, and mangoes. Vitamin C helps your body absorb iron. Certain foods and drinks prevent your body from absorbing iron properly. Avoid eating these foods in the same meal as iron-rich foods or with iron supplements. These foods include: Coffee, black tea, and red wine. Milk, dairy products, and foods that are high in calcium. Beans and soybeans. Whole grains. General information Take iron supplements only as told by your health care provider. An overdose of iron can be life-threatening. If you were prescribed iron supplements, take them with orange juice or a vitamin C supplement. When you eat  iron-fortified foods or take an iron supplement, you should also eat foods that naturally contain iron, such as meat, poultry, and fish. Eating naturally iron-rich foods helps your body absorb the iron that is added to other foods or contained in a supplement. Iron from animal sources is better absorbed than iron from plant sources. What foods should I eat? Fruits Prunes. Raisins. Eat fruits high in vitamin C, such as oranges, grapefruits, and strawberries, with iron-rich foods. Vegetables Spinach (cooked). Green peas. Broccoli. Fermented vegetables. Eat vegetables high in vitamin C, such as leafy greens, potatoes, bell peppers, and tomatoes, with iron-rich foods. Grains Iron-fortified breakfast cereal. Iron-fortified whole-wheat bread. Enriched rice. Sprouted grains. Meats and other proteins Beef liver. Beef. Turkey. Chicken. Oysters. Shrimp. Tuna. Sardines. Chickpeas. Nuts. Tofu. Pumpkin seeds. Beverages Tomato juice. Fresh orange juice. Prune juice. Hibiscus tea. Iron-fortified instant breakfast shakes. Sweets and desserts Blackstrap molasses. Seasonings and condiments Tahini. Fermented soy sauce. Other foods Wheat germ. The items listed above may not be a complete list of recommended foods and beverages. Contact a dietitian for more information. What foods should I limit? These are foods that should be limited while eating iron-rich foods as they can reduce the absorption of iron in your body. Grains Whole grains. Bran cereal. Bran flour. Meats and other proteins Soybeans. Products made from soy protein. Black beans. Lentils. Mung beans. Split peas. Dairy Milk. Cream. Cheese. Yogurt. Cottage cheese. Beverages Coffee. Black tea. Red wine. Sweets and desserts Cocoa. Chocolate. Ice cream. Seasonings and condiments Basil. Oregano. Large amounts of parsley. The items listed   above may not be a complete list of foods and beverages you should limit. Contact a dietitian for more  information. Summary Iron is a mineral that helps your body produce hemoglobin. Hemoglobin is a protein in red blood cells that carries oxygen to your body's tissues. Iron is naturally found in many foods, and many foods have iron added to them (are iron-fortified). When you eat foods that contain iron, you should eat them with foods that are high in vitamin C. Vitamin C helps your body absorb iron. Certain foods and drinks prevent your body from absorbing iron properly, such as whole grains and dairy products. You should avoid eating these foods in the same meal as iron-rich foods or with iron supplements. This information is not intended to replace advice given to you by your health care provider. Make sure you discuss any questions you have with your health care provider. Document Revised: 05/14/2020 Document Reviewed: 05/14/2020 Elsevier Patient Education  2023 Elsevier Inc.  

## 2021-10-18 LAB — CBC WITH DIFFERENTIAL/PLATELET
Basophils Absolute: 0 10*3/uL (ref 0.0–0.2)
Basos: 1 %
EOS (ABSOLUTE): 0.3 10*3/uL (ref 0.0–0.4)
Eos: 3 %
Hematocrit: 23.9 % — ABNORMAL LOW (ref 34.0–46.6)
Hemoglobin: 7.5 g/dL — CL (ref 11.1–15.9)
Immature Grans (Abs): 0 10*3/uL (ref 0.0–0.1)
Immature Granulocytes: 0 %
Lymphocytes Absolute: 1.4 10*3/uL (ref 0.7–3.1)
Lymphs: 17 %
MCH: 29.6 pg (ref 26.6–33.0)
MCHC: 31.4 g/dL — ABNORMAL LOW (ref 31.5–35.7)
MCV: 95 fL (ref 79–97)
Monocytes Absolute: 0.7 10*3/uL (ref 0.1–0.9)
Monocytes: 8 %
Neutrophils Absolute: 5.9 10*3/uL (ref 1.4–7.0)
Neutrophils: 71 %
Platelets: 436 10*3/uL (ref 150–450)
RBC: 2.53 x10E6/uL — CL (ref 3.77–5.28)
RDW: 11.6 % — ABNORMAL LOW (ref 11.7–15.4)
WBC: 8.3 10*3/uL (ref 3.4–10.8)

## 2021-10-21 ENCOUNTER — Encounter: Payer: Self-pay | Admitting: Family

## 2021-10-21 ENCOUNTER — Ambulatory Visit (INDEPENDENT_AMBULATORY_CARE_PROVIDER_SITE_OTHER): Payer: Medicare Other | Admitting: Family

## 2021-10-21 ENCOUNTER — Other Ambulatory Visit: Payer: Medicare Other

## 2021-10-21 DIAGNOSIS — R5383 Other fatigue: Secondary | ICD-10-CM

## 2021-10-21 DIAGNOSIS — R0602 Shortness of breath: Secondary | ICD-10-CM

## 2021-10-21 DIAGNOSIS — K922 Gastrointestinal hemorrhage, unspecified: Secondary | ICD-10-CM

## 2021-10-21 DIAGNOSIS — K921 Melena: Secondary | ICD-10-CM | POA: Diagnosis not present

## 2021-10-21 LAB — HEMOGLOBIN, FINGERSTICK: Hemoglobin: 7.5 g/dL — ABNORMAL LOW (ref 11.1–15.9)

## 2021-10-21 NOTE — Patient Instructions (Signed)
Gastrointestinal Bleeding Gastrointestinal (GI) bleeding is bleeding somewhere along the digestive tract, between the mouth and the anus. The digestive tract includes the mouth, esophagus, stomach, small intestine, large intestine, and anus. The large intestine is often called the colon. GI bleeding can be caused by various problems. The severity of these problems can be mild, serious, or life-threatening. If you have GI bleeding, you may find blood in your stools (feces), you may have black stools, or you may vomit blood. You may need to stay in the hospital if there is a lot of bleeding. What are the causes? This condition may be caused by: Inflammation, irritation, or swelling of the esophagus (esophagitis). The esophagus is part of the body that moves food from your mouth to your stomach. Swollen veins in the rectum (hemorrhoids). Tears in the anus (anal fissures). The tears are often caused by passing hard stool. Pouches that form on the colon and may bleed (diverticulosis). Inflammation in areas with diverticulosis. This is called diverticulitis.This can cause pain, fever, and bloody stools. Growths (polyps) or cancer. Colon cancer often starts out as precancerous polyps. Gastritis and ulcers. These may cause bleeding in the upper GI tract, near the stomach. What increases the risk? You are more likely to develop this condition if: You have an infection in your stomach from a type of bacteria called Helicobacter pylori. You take certain medicines, such as: NSAIDs. Aspirin. Selective serotonin reuptake inhibitors (SSRIs). Steroids. Antiplatelet or anticoagulant medicines. You smoke. You drink alcohol. What are the signs or symptoms? Common symptoms of this condition include: Bright red blood in your vomit, or vomit that looks like coffee grounds. Bloody, black, or tarry stools. Bleeding from the lower GI tract will usually cause red or maroon blood in the stools. Bleeding from the  upper GI tract may cause black, tarry stools that are often stronger smelling than usual. In certain cases, if the bleeding is fast enough, the stools may be red. Pain or cramping in the abdomen. How is this diagnosed? This condition may be diagnosed based on: Your medical history and a physical exam. Various tests, such as: Blood tests. Stool tests. X-rays and other imaging tests. Esophagogastroduodenoscopy (EGD). In this test, a flexible, lighted tube is used to look at your esophagus, stomach, and small intestine. Colonoscopy. In this test, a flexible, lighted tube is used to look at your colon. How is this treated? Treatment for this condition depends on the cause of the bleeding. For example: For bleeding from the esophagus, stomach, small intestine, or colon, the health care provider may do a procedure to stop bleeding during your EGD or colonoscopy. Inflammation or infection of the colon can be treated with medicines. Certain rectal problems can be treated with creams, suppositories, or warm baths. Medicines may be given to reduce acid in your stomach. Surgery is sometimes done. Blood transfusions are sometimes needed if a lot of blood has been lost. If there is a lot of bleeding, you will need to stay in the hospital for observation. If bleeding is mild, you may be allowed to go home. Follow these instructions at home:  Take over-the-counter and prescription medicines only as told by your health care provider. Eat foods that are high in fiber, such as beans, whole grains, and fresh fruits and vegetables. This will help to keep your stools soft. Eating 1-3 prunes each day works well for many people. Drink enough fluid to keep your urine pale yellow. Keep all follow-up visits. This is important. Contact a   health care provider if: Your symptoms do not improve with treatment. Get help right away if: Your bleeding does not stop. You feel light-headed or you faint. You feel  weak. You have severe cramps in your back or abdomen. You pass large blood clots in your stool. Your symptoms are getting worse. You have chest pain or fast heartbeats. These symptoms may be an emergency. Get help right away. Call 911. Do not wait to see if the symptoms will go away. Do not drive yourself to the hospital. Summary Gastrointestinal (GI) bleeding is bleeding somewhere along the digestive tract, between the mouth and anus. GI bleeding can be caused by various problems. Treatment for this condition depends on the cause of the bleeding. Take over-the-counter and prescription medicines only as told by your health care provider. Get help right away if your bleeding increases, your symptoms are getting worse, or you have new symptoms. Keep all follow-up visits. This is important. This information is not intended to replace advice given to you by your health care provider. Make sure you discuss any questions you have with your health care provider. Document Revised: 01/04/2021 Document Reviewed: 01/04/2021 Elsevier Patient Education  2023 Elsevier Inc.  

## 2021-10-21 NOTE — Addendum Note (Signed)
Addended by: Julious Payer D on: 10/21/2021 08:59 AM ? ? Modules accepted: Orders ? ?

## 2021-10-21 NOTE — Addendum Note (Signed)
Addended by: Julious Payer D on: 10/21/2021 08:34 AM ? ? Modules accepted: Orders ? ?

## 2021-10-21 NOTE — Progress Notes (Signed)
? ?Virtual Visit  Note ?Due to COVID-19 pandemic this visit was conducted virtually. This visit type was conducted due to national recommendations for restrictions regarding the COVID-19 Pandemic (e.g. social distancing, sheltering in place) in an effort to limit this patient's exposure and mitigate transmission in our community. All issues noted in this document were discussed and addressed.  A physical exam was not performed with this format. ? ?I connected with Tracy Jensen on 10/21/21 at 1:54 pm  by telephone and verified that I am speaking with the correct person using two identifiers. Tracy Jensen is currently located at home and no one is currently with her during visit. The provider, Jannifer Rodney, FNP is located in their office at time of visit. ? ?I discussed the limitations, risks, security and privacy concerns of performing an evaluation and management service by telephone and the availability of in person appointments. I also discussed with the patient that there may be a patient responsible charge related to this service. The patient expressed understanding and agreed to proceed. ? ?Ms. Cage,you are scheduled for a virtual visit with your provider today.   ? ?Just as we do with appointments in the office, we must obtain your consent to participate.  Your consent will be active for this visit and any virtual visit you may have with one of our providers in the next 365 days.   ? ?If you have a MyChart account, I can also send a copy of this consent to you electronically.  All virtual visits are billed to your insurance company just like a traditional visit in the office.  As this is a virtual visit, video technology does not allow for your provider to perform a traditional examination.  This may limit your provider's ability to fully assess your condition.  If your provider identifies any concerns that need to be evaluated in person or the need to arrange testing such as labs, EKG, etc, we will make  arrangements to do so.   ? ?Although advances in technology are sophisticated, we cannot ensure that it will always work on either your end or our end.  If the connection with a video visit is poor, we may have to switch to a telephone visit.  With either a video or telephone visit, we are not always able to ensure that we have a secure connection.   I need to obtain your verbal consent now.   Are you willing to proceed with your visit today?  ? ?Tracy Jensen has provided verbal consent on 10/21/2021 for a virtual visit (video or telephone). ? ? ?Jannifer Rodney, FNP ?10/21/2021  1:46 PM ? ? ? ?History and Present Illness: ? ?HPI ?Pt presents to the office today to recheck hgb. She was seen last week and found to have a hgb of 7.9. Positive FOBT. We placed a referral to GI and has an appt 10/31/21. We stopped her diclofenac and aspirin.  ?  ?Today her Hgb is 7.5. She reports the bleeding has slowed down and has not seen any blood in her stools recently. She took some laxatives this morning. She states she feels fatigued. She was able to house chores yesterday, but afterwards felt SOB.  ?  ?She is complaining of LUQ mild aching.  ? ? ?Review of Systems  ?Constitutional:  Positive for malaise/fatigue.  ?Respiratory:  Positive for shortness of breath.   ?Gastrointestinal:  Positive for blood in stool.  ? ? ?Observations/Objective: ?No SOB or distress noted  ? ?  Assessment and Plan ?1. Gastrointestinal hemorrhage, unspecified gastrointestinal hemorrhage type ? ?2. Other fatigue ? ?3. SOB (shortness of breath) ? ?Hgb stable ?Will come back Thursday to recheck  ?Call GI and get on cancellation list ?Avoid all NSAID's  ?High iron diet ?Go to ED if symptoms worsen or do not improve  ? ?  ?I discussed the assessment and treatment plan with the patient. The patient was provided an opportunity to ask questions and all were answered. The patient agreed with the plan and demonstrated an understanding of the instructions. ?  ?The  patient was advised to call back or seek an in-person evaluation if the symptoms worsen or if the condition fails to improve as anticipated. ? ?The above assessment and management plan was discussed with the patient. The patient verbalized understanding of and has agreed to the management plan. Patient is aware to call the clinic if symptoms persist or worsen. Patient is aware when to return to the clinic for a follow-up visit. Patient educated on when it is appropriate to go to the emergency department.  ? ?Time call ended:  2:07 pm  ? ?I provided 13 minutes of  non face-to-face time during this encounter. ? ? ? ?Jannifer Rodney, FNP ? ? ?

## 2021-10-22 LAB — CBC WITH DIFFERENTIAL/PLATELET
Basophils Absolute: 0 10*3/uL (ref 0.0–0.2)
Basos: 1 %
EOS (ABSOLUTE): 0.3 10*3/uL (ref 0.0–0.4)
Eos: 5 %
Hematocrit: 23.3 % — ABNORMAL LOW (ref 34.0–46.6)
Hemoglobin: 7.3 g/dL — CL (ref 11.1–15.9)
Immature Grans (Abs): 0 10*3/uL (ref 0.0–0.1)
Immature Granulocytes: 0 %
Lymphocytes Absolute: 1.4 10*3/uL (ref 0.7–3.1)
Lymphs: 30 %
MCH: 29.3 pg (ref 26.6–33.0)
MCHC: 31.3 g/dL — ABNORMAL LOW (ref 31.5–35.7)
MCV: 94 fL (ref 79–97)
Monocytes Absolute: 0.5 10*3/uL (ref 0.1–0.9)
Monocytes: 10 %
Neutrophils Absolute: 2.5 10*3/uL (ref 1.4–7.0)
Neutrophils: 54 %
Platelets: 445 10*3/uL (ref 150–450)
RBC: 2.49 x10E6/uL — CL (ref 3.77–5.28)
RDW: 11.9 % (ref 11.7–15.4)
WBC: 4.7 10*3/uL (ref 3.4–10.8)

## 2021-10-24 ENCOUNTER — Encounter: Payer: Self-pay | Admitting: Family

## 2021-10-24 ENCOUNTER — Ambulatory Visit (INDEPENDENT_AMBULATORY_CARE_PROVIDER_SITE_OTHER): Payer: Medicare Other | Admitting: Family

## 2021-10-24 ENCOUNTER — Other Ambulatory Visit: Payer: Medicare Other

## 2021-10-24 DIAGNOSIS — D500 Iron deficiency anemia secondary to blood loss (chronic): Secondary | ICD-10-CM

## 2021-10-24 DIAGNOSIS — R5383 Other fatigue: Secondary | ICD-10-CM | POA: Diagnosis not present

## 2021-10-24 DIAGNOSIS — D649 Anemia, unspecified: Secondary | ICD-10-CM

## 2021-10-24 DIAGNOSIS — K922 Gastrointestinal hemorrhage, unspecified: Secondary | ICD-10-CM

## 2021-10-24 LAB — HEMOGLOBIN, FINGERSTICK: Hemoglobin: 8 g/dL — ABNORMAL LOW (ref 11.1–15.9)

## 2021-10-24 NOTE — Patient Instructions (Signed)
Gastrointestinal Bleeding Gastrointestinal (GI) bleeding is bleeding somewhere along the digestive tract, between the mouth and the anus. The digestive tract includes the mouth, esophagus, stomach, small intestine, large intestine, and anus. The large intestine is often called the colon. GI bleeding can be caused by various problems. The severity of these problems can be mild, serious, or life-threatening. If you have GI bleeding, you may find blood in your stools (feces), you may have black stools, or you may vomit blood. You may need to stay in the hospital if there is a lot of bleeding. What are the causes? This condition may be caused by: Inflammation, irritation, or swelling of the esophagus (esophagitis). The esophagus is part of the body that moves food from your mouth to your stomach. Swollen veins in the rectum (hemorrhoids). Tears in the anus (anal fissures). The tears are often caused by passing hard stool. Pouches that form on the colon and may bleed (diverticulosis). Inflammation in areas with diverticulosis. This is called diverticulitis.This can cause pain, fever, and bloody stools. Growths (polyps) or cancer. Colon cancer often starts out as precancerous polyps. Gastritis and ulcers. These may cause bleeding in the upper GI tract, near the stomach. What increases the risk? You are more likely to develop this condition if: You have an infection in your stomach from a type of bacteria called Helicobacter pylori. You take certain medicines, such as: NSAIDs. Aspirin. Selective serotonin reuptake inhibitors (SSRIs). Steroids. Antiplatelet or anticoagulant medicines. You smoke. You drink alcohol. What are the signs or symptoms? Common symptoms of this condition include: Bright red blood in your vomit, or vomit that looks like coffee grounds. Bloody, black, or tarry stools. Bleeding from the lower GI tract will usually cause red or maroon blood in the stools. Bleeding from the  upper GI tract may cause black, tarry stools that are often stronger smelling than usual. In certain cases, if the bleeding is fast enough, the stools may be red. Pain or cramping in the abdomen. How is this diagnosed? This condition may be diagnosed based on: Your medical history and a physical exam. Various tests, such as: Blood tests. Stool tests. X-rays and other imaging tests. Esophagogastroduodenoscopy (EGD). In this test, a flexible, lighted tube is used to look at your esophagus, stomach, and small intestine. Colonoscopy. In this test, a flexible, lighted tube is used to look at your colon. How is this treated? Treatment for this condition depends on the cause of the bleeding. For example: For bleeding from the esophagus, stomach, small intestine, or colon, the health care provider may do a procedure to stop bleeding during your EGD or colonoscopy. Inflammation or infection of the colon can be treated with medicines. Certain rectal problems can be treated with creams, suppositories, or warm baths. Medicines may be given to reduce acid in your stomach. Surgery is sometimes done. Blood transfusions are sometimes needed if a lot of blood has been lost. If there is a lot of bleeding, you will need to stay in the hospital for observation. If bleeding is mild, you may be allowed to go home. Follow these instructions at home:  Take over-the-counter and prescription medicines only as told by your health care provider. Eat foods that are high in fiber, such as beans, whole grains, and fresh fruits and vegetables. This will help to keep your stools soft. Eating 1-3 prunes each day works well for many people. Drink enough fluid to keep your urine pale yellow. Keep all follow-up visits. This is important. Contact a   health care provider if: Your symptoms do not improve with treatment. Get help right away if: Your bleeding does not stop. You feel light-headed or you faint. You feel  weak. You have severe cramps in your back or abdomen. You pass large blood clots in your stool. Your symptoms are getting worse. You have chest pain or fast heartbeats. These symptoms may be an emergency. Get help right away. Call 911. Do not wait to see if the symptoms will go away. Do not drive yourself to the hospital. Summary Gastrointestinal (GI) bleeding is bleeding somewhere along the digestive tract, between the mouth and anus. GI bleeding can be caused by various problems. Treatment for this condition depends on the cause of the bleeding. Take over-the-counter and prescription medicines only as told by your health care provider. Get help right away if your bleeding increases, your symptoms are getting worse, or you have new symptoms. Keep all follow-up visits. This is important. This information is not intended to replace advice given to you by your health care provider. Make sure you discuss any questions you have with your health care provider. Document Revised: 01/04/2021 Document Reviewed: 01/04/2021 Elsevier Patient Education  2023 Elsevier Inc.  

## 2021-10-24 NOTE — Progress Notes (Signed)
? ?  Virtual Visit  Note ?Due to COVID-19 pandemic this visit was conducted virtually. This visit type was conducted due to national recommendations for restrictions regarding the COVID-19 Pandemic (e.g. social distancing, sheltering in place) in an effort to limit this patient's exposure and mitigate transmission in our community. All issues noted in this document were discussed and addressed.  A physical exam was not performed with this format. ? ?I connected with Tracy Jensen on 10/24/21 at 8:46 AM  by telephone and verified that I am speaking with the correct person using two identifiers. Tracy Jensen is currently located at car and no one is currently with her during visit. The provider, Evelina Dun, FNP is located in their office at time of visit. ? ?I discussed the limitations, risks, security and privacy concerns of performing an evaluation and management service by telephone and the availability of in person appointments. I also discussed with the patient that there may be a patient responsible charge related to this service. The patient expressed understanding and agreed to proceed. ? ? ?History and Present Illness: ? ?HPI ?Pt presents to the office today to recheck hgb. She was seen on 10/11/21 with a hgb of  7.9.  Positive FOBT. We placed a referral to GI and has an appt 10/31/21. We stopped her diclofenac and aspirin. She has called the GI office for cancellation, but has not been able to get an earlier appointment.  ?  ?She came in today for Hgb and this is pending. She reports the bleeding has stopped and has not seen any blood in her stools recently. She took some laxatives this morning. She states she feels fatigued and cold.  She was able to house chores yesterday, but afterwards felt SOB. States she feels better today then she has the last week.  ?  ? ?Review of Systems  ?Constitutional:  Positive for malaise/fatigue.  ?Respiratory:  Positive for shortness of breath.   ?All other systems reviewed  and are negative. ? ? ?Observations/Objective: ?No SOB or distress noted  ? ?Assessment and Plan: ?1. Gastrointestinal hemorrhage, unspecified gastrointestinal hemorrhage type ? ?2. Anemia, unspecified type ? ?3. Other fatigue ? ?Hgb and CBC pending ?Follow up on Monday to recheck  ?Call GI office daily to check cancellations  ?If SOB or bleeding worsens go to ED ? ? ? ? ?  ?I discussed the assessment and treatment plan with the patient. The patient was provided an opportunity to ask questions and all were answered. The patient agreed with the plan and demonstrated an understanding of the instructions. ?  ?The patient was advised to call back or seek an in-person evaluation if the symptoms worsen or if the condition fails to improve as anticipated. ? ?The above assessment and management plan was discussed with the patient. The patient verbalized understanding of and has agreed to the management plan. Patient is aware to call the clinic if symptoms persist or worsen. Patient is aware when to return to the clinic for a follow-up visit. Patient educated on when it is appropriate to go to the emergency department.  ? ?Time call ended:  9:00 AM  ? ?I provided 14 minutes of  non face-to-face time during this encounter. ? ? ? ?Evelina Dun, FNP ? ? ?

## 2021-10-25 ENCOUNTER — Telehealth: Payer: Self-pay | Admitting: Emergency Medicine

## 2021-10-25 LAB — CBC WITH DIFFERENTIAL/PLATELET
Basophils Absolute: 0 10*3/uL (ref 0.0–0.2)
Basos: 1 %
EOS (ABSOLUTE): 0.1 10*3/uL (ref 0.0–0.4)
Eos: 3 %
Hematocrit: 24.3 % — ABNORMAL LOW (ref 34.0–46.6)
Hemoglobin: 7.7 g/dL — CL (ref 11.1–15.9)
Immature Grans (Abs): 0 10*3/uL (ref 0.0–0.1)
Immature Granulocytes: 0 %
Lymphocytes Absolute: 1.3 10*3/uL (ref 0.7–3.1)
Lymphs: 30 %
MCH: 28.7 pg (ref 26.6–33.0)
MCHC: 31.7 g/dL (ref 31.5–35.7)
MCV: 91 fL (ref 79–97)
Monocytes Absolute: 0.4 10*3/uL (ref 0.1–0.9)
Monocytes: 11 %
Neutrophils Absolute: 2.3 10*3/uL (ref 1.4–7.0)
Neutrophils: 55 %
Platelets: 450 10*3/uL (ref 150–450)
RBC: 2.68 x10E6/uL — CL (ref 3.77–5.28)
RDW: 12.3 % (ref 11.7–15.4)
WBC: 4.2 10*3/uL (ref 3.4–10.8)

## 2021-10-25 NOTE — Telephone Encounter (Signed)
[name] but looks like it stable, I do not see when Tracy Jensen wants her to come back and recheck but probably Monday Tuesday or Wednesday of next week come back and recheck a CBC. ?

## 2021-10-25 NOTE — Telephone Encounter (Signed)
[facility]- Hgb 7.7  ? ?Please advise.  ?

## 2021-10-25 NOTE — Telephone Encounter (Signed)
Patient notified. Coming back in Monday to have rechecked ?

## 2021-10-28 ENCOUNTER — Encounter: Payer: Self-pay | Admitting: Family

## 2021-10-28 ENCOUNTER — Ambulatory Visit (INDEPENDENT_AMBULATORY_CARE_PROVIDER_SITE_OTHER): Payer: Medicare Other | Admitting: Family

## 2021-10-28 ENCOUNTER — Other Ambulatory Visit: Payer: Medicare Other

## 2021-10-28 DIAGNOSIS — R5383 Other fatigue: Secondary | ICD-10-CM | POA: Diagnosis not present

## 2021-10-28 DIAGNOSIS — D649 Anemia, unspecified: Secondary | ICD-10-CM

## 2021-10-28 DIAGNOSIS — R0602 Shortness of breath: Secondary | ICD-10-CM

## 2021-10-28 DIAGNOSIS — K922 Gastrointestinal hemorrhage, unspecified: Secondary | ICD-10-CM | POA: Diagnosis not present

## 2021-10-28 LAB — HEMOGLOBIN, FINGERSTICK: Hemoglobin: 8.2 g/dL — ABNORMAL LOW (ref 11.1–15.9)

## 2021-10-28 NOTE — Progress Notes (Signed)
? ?  Virtual Visit  Note ?Due to COVID-19 pandemic this visit was conducted virtually. This visit type was conducted due to national recommendations for restrictions regarding the COVID-19 Pandemic (e.g. social distancing, sheltering in place) in an effort to limit this patient's exposure and mitigate transmission in our community. All issues noted in this document were discussed and addressed.  A physical exam was not performed with this format. ? ?I connected with [name] on 10/28/21 at 1:30 pm by telephone and verified that I am speaking with the correct person using two identifiers. BROGAN ENGLAND is currently located at home and no one is currently with her during visit. The provider, Jannifer Rodney, FNP is located in their office at time of visit. ? ?I discussed the limitations, risks, security and privacy concerns of performing an evaluation and management service by telephone and the availability of in person appointments. I also discussed with the patient that there may be a patient responsible charge related to this service. The patient expressed understanding and agreed to proceed. ? ? ?History and Present Illness: ? ?HPI ?Pt presents to the office today to recheck hgb. She was seen on 10/11/21 with a hgb of  7.9.  Positive FOBT. We placed a referral to GI and has an appt 10/31/21. We stopped her diclofenac and aspirin. She has called the GI office for cancellation, but has not been able to get an earlier appointment.  ?  ?She came in today for Hgb and it was 8.2.  She reports the bleeding has stopped and has not seen any blood in her stools recently. She took some laxatives daily.  She states she feels fatigued and cold.  She was able to house chores, but afterwards feels SOB. ?  ? ?Review of Systems  ?Constitutional:  Positive for malaise/fatigue.  ?Respiratory:  Positive for shortness of breath.   ?All other systems reviewed and are negative. ? ? ?Observations/Objective: ?No SOB or distress noted   ? ?Assessment and Plan: ?1. Gastrointestinal hemorrhage, unspecified gastrointestinal hemorrhage type ? ?2. SOB (shortness of breath) ? ?3. Other fatigue ? ?CBC pending  ?Hgb slightly increased from last week! ?Keep GI appt Thursday ?Continue iron rich diet ?Follow up with me as needed  ? ?  ?I discussed the assessment and treatment plan with the patient. The patient was provided an opportunity to ask questions and all were answered. The patient agreed with the plan and demonstrated an understanding of the instructions. ?  ?The patient was advised to call back or seek an in-person evaluation if the symptoms worsen or if the condition fails to improve as anticipated. ? ?The above assessment and management plan was discussed with the patient. The patient verbalized understanding of and has agreed to the management plan. Patient is aware to call the clinic if symptoms persist or worsen. Patient is aware when to return to the clinic for a follow-up visit. Patient educated on when it is appropriate to go to the emergency department.  ? ?Time call ended:  1:32 pm ? ?I provided 12 minutes of  non face-to-face time during this encounter. ? ? ? ?Jannifer Rodney, FNP ? ? ?

## 2021-10-29 LAB — CBC WITH DIFFERENTIAL/PLATELET
Basophils Absolute: 0 10*3/uL (ref 0.0–0.2)
Basos: 1 %
EOS (ABSOLUTE): 0.1 10*3/uL (ref 0.0–0.4)
Eos: 3 %
Hematocrit: 24.4 % — ABNORMAL LOW (ref 34.0–46.6)
Hemoglobin: 7.5 g/dL — CL (ref 11.1–15.9)
Immature Grans (Abs): 0 10*3/uL (ref 0.0–0.1)
Immature Granulocytes: 0 %
Lymphocytes Absolute: 1.4 10*3/uL (ref 0.7–3.1)
Lymphs: 27 %
MCH: 27.7 pg (ref 26.6–33.0)
MCHC: 30.7 g/dL — ABNORMAL LOW (ref 31.5–35.7)
MCV: 90 fL (ref 79–97)
Monocytes Absolute: 0.5 10*3/uL (ref 0.1–0.9)
Monocytes: 9 %
Neutrophils Absolute: 3.1 10*3/uL (ref 1.4–7.0)
Neutrophils: 60 %
Platelets: 466 10*3/uL — ABNORMAL HIGH (ref 150–450)
RBC: 2.71 x10E6/uL — CL (ref 3.77–5.28)
RDW: 12.5 % (ref 11.7–15.4)
WBC: 5.1 10*3/uL (ref 3.4–10.8)

## 2021-10-31 ENCOUNTER — Ambulatory Visit: Payer: Medicare Other | Admitting: Nurse Practitioner

## 2021-10-31 ENCOUNTER — Encounter: Payer: Self-pay | Admitting: Nurse Practitioner

## 2021-10-31 ENCOUNTER — Other Ambulatory Visit: Payer: Medicare Other

## 2021-10-31 ENCOUNTER — Encounter: Payer: Self-pay | Admitting: Gastroenterology

## 2021-10-31 VITALS — BP 100/60 | HR 84 | Ht <= 58 in | Wt 109.5 lb

## 2021-10-31 DIAGNOSIS — D649 Anemia, unspecified: Secondary | ICD-10-CM | POA: Diagnosis not present

## 2021-10-31 DIAGNOSIS — K921 Melena: Secondary | ICD-10-CM | POA: Diagnosis not present

## 2021-10-31 MED ORDER — PANTOPRAZOLE SODIUM 40 MG PO TBEC: 40.0000 mg | DELAYED_RELEASE_TABLET | Freq: Every day | ORAL | 3 refills | Status: DC

## 2021-10-31 NOTE — Addendum Note (Signed)
Addended by: Sumner Boast on: 10/31/2021 11:09 AM   Modules accepted: Orders

## 2021-10-31 NOTE — Progress Notes (Signed)
Assessment   Patient profile:  Tracy Jensen is a 66 y.o. female  new to practice, referred by PCP for GI bleed . Past medical history significant for anxiety, arthritis, stroke, hypertension, osteoporosis, headaches, possible SBO in 2020.  See PMH below for any additional history  [place] anemia / recent melena in setting of daily NSAIDS / Diclofenac. Rule out PUD.  Hgb 12.4 >> 7.9.  Diclofenac stopped, no melena in nearly 3 weeks now. Hgb has remained stable at 8.2.   Osteoporosis, on Fosamax  History of CVA, on baby aspirin   Plan   Pantoprazole 40 mg q day ( thinks Omeprazole gave headache in past).  Start today.  No diclofenac or Excedrin for now. No other NSAIDS except okay to take the daily baby asa ( hx of CVA. Obtain iron studies  .Schedule for EGD. Dr. Orvan FalconerBeavers has opening in am. The risks and benefits of EGD with possible biopsies were discussed with the patient who agrees to proceed.    History of Present Illness   Chief complaint:  recent black stool and anemia  Tracy Jensen was having black stool the last week of April. Stool sample was + for blood. She hasn't had any nausea / vomiting but  had an episode of LUQ pain on 5/7 after eating raisins. Hgb in 7 range down from 12.4 in early April.  She gets a little SOB when exerting herself but otherwise feels okay. She was on baby asa and diclofenac until recently. She also takes Excedrin a couple of time a week. PCP stopped Diclofenac on 5/1. She is still on baby asa daily.    In years 2020-2021 her hgb was in 10 range but then in April 2022 hgb was normal at 12.4.  With the melena her hgb fell to 7.9 on 10/11/21. She has felt okay so and not gone to ED or required blood transfusion. Multiple repeat CBCs over last several days have shown hgb to be stable. At last check on 5/15 it was 8.2. BMs have been back to normal for almost 3 weeks.     Previous Labs / Imaging::    Latest Ref Rng & Units 10/28/2021    9:20 AM 10/24/2021     8:40 AM 10/21/2021    8:36 AM  CBC  WBC 3.4 - 10.8 x10E3/uL 5.1   4.2   4.7    Hemoglobin 11.1 - 15.9 g/dL 7.5   7.7   7.3    Hematocrit 34.0 - 46.6 % 24.4   24.3   23.3    Platelets 150 - 450 x10E3/uL 466   450   445      Lab Results  Component Value Date   LIPASE 57 (H) 07/10/2018      Latest Ref Rng & Units 09/03/2021   10:19 AM 03/06/2021   10:17 AM 09/21/2020    9:49 AM  CMP  Glucose 70 - 99 mg/dL 87   86   96    BUN 8 - 27 mg/dL 19   17   24     Creatinine 0.57 - 1.00 mg/dL 1.610.87   0.960.79   0.451.02    Sodium 134 - 144 mmol/L 144   143   141    Potassium 3.5 - 5.2 mmol/L 4.6   4.3   4.7    Chloride 96 - 106 mmol/L 102   103   102    CO2 20 - 29 mmol/L 27   25  23    Calcium 8.7 - 10.3 mg/dL 9.7   9.5   9.7    Total Protein 6.0 - 8.5 g/dL 6.7   6.7   6.3    Total Bilirubin 0.0 - 1.2 mg/dL <8.2   0.2   0.2    Alkaline Phos 44 - 121 IU/L 67   75   61    AST 0 - 40 IU/L 21   18   12     ALT 0 - 32 IU/L 16   14   15        Previous GI Evaluations   Endoscopies: Screening colonoscopy July 2015 [facility] - Dr. August 2015 -- exam was complete, excellent prep.  Findings included scattered diverticula, melanosis coli retroflexed views of the rectum without abnormalities.   Past Medical History:  Diagnosis Date   Anemia    Anxiety    Arthritis    GI bleed    Hypertension    Insomnia    Migraines    Sciatica    TIA (transient ischemic attack)    Past Surgical History:  Procedure Laterality Date   CAROTID ARTERY ANGIOPLASTY  09/2000   CATARACT EXTRACTION, BILATERAL Bilateral    CESAREAN SECTION     1991, 1984   COLONOSCOPY     ROTATOR CUFF REPAIR Right 2020   Family History  Problem Relation Age of Onset   Diabetes Mother    Asthma Mother    Hypertension Mother    Hypertension Father    Heart disease Father    Social History   Tobacco Use   Smoking status: Never   Smokeless tobacco: Never  Vaping Use   Vaping Use: Never used  Substance Use  Topics   Alcohol use: Yes    Comment: occ   Drug use: No   Current Outpatient Medications  Medication Sig Dispense Refill   alendronate (FOSAMAX) 70 MG tablet Take 1 tablet (70 mg total) by mouth every 7 (seven) days. Take with a full glass of water on an empty stomach. 12 tablet 1   Ascorbic Acid (VITAMIN C) 100 MG tablet Take 100 mg by mouth daily.     aspirin-acetaminophen-caffeine (EXCEDRIN MIGRAINE) 250-250-65 MG tablet Take by mouth.     busPIRone (BUSPAR) 15 MG tablet Take 1 tablet (15 mg total) by mouth 3 (three) times daily. 270 tablet 3   Calcium Carb-Cholecalciferol (CALCIUM 1000 + D PO) Take by mouth.     cholecalciferol (VITAMIN D3) 25 MCG (1000 UNIT) tablet Take 1,000 Units by mouth daily.     cyclobenzaprine (FLEXERIL) 10 MG tablet Take 1 tablet (10 mg total) by mouth 3 (three) times daily. 90 tablet 3   fluticasone (FLONASE) 50 MCG/ACT nasal spray Place 2 sprays into both nostrils daily. 16 g 6   lisinopril (ZESTRIL) 20 MG tablet Take 1 tablet (20 mg total) by mouth daily. (NEEDS TO BE SEEN BEFORE NEXT REFILL) 90 tablet 3   mirabegron ER (MYRBETRIQ) 50 MG TB24 tablet Take 1 tablet (50 mg total) by mouth daily. 90 tablet 3   polyethylene glycol (MIRALAX / GLYCOLAX) packet Take 17 g by mouth daily as needed for mild constipation or moderate constipation. 14 each 0   traZODone (DESYREL) 50 MG tablet Take 3 tablets (150 mg total) by mouth at bedtime. 90 tablet 5   zinc gluconate 50 MG tablet Take 50 mg by mouth daily.     No current facility-administered medications for this visit.   Allergies  Allergen  Reactions   Azithromycin Hives    Hives after Zpack     Review of Systems: Positive for vision changes.  All other systems reviewed and negative except where noted in HPI.   Physical Exam   Wt Readings from Last 3 Encounters:  10/31/21 109 lb 8 oz (49.7 kg)  10/17/21 108 lb (49 kg)  10/11/21 111 lb (50.3 kg)    BP 100/60   Pulse 84   Ht 4' 8.5" (1.435 m) Comment:  height measured without shoes  Wt 109 lb 8 oz (49.7 kg)   BMI 24.12 kg/m  Constitutional:  Generally well appearing female in no acute distress. Psychiatric: Pleasant. Normal mood and affect. Behavior is normal. EENT: Pupils normal.  Conjunctivae are normal. No scleral icterus. Neck supple.  Cardiovascular: Normal rate, regular rhythm. No edema Pulmonary/chest: Effort normal and breath sounds normal. No wheezing, rales or rhonchi. Abdominal: Soft, nondistended, nontender. Bowel sounds active throughout. There are no masses palpable. No hepatomegaly. Neurological: Alert and oriented to person place and time. Skin: Skin is warm and dry. No rashes noted.  Willette Cluster, NP  10/31/2021, 10:02 AM  Cc:  Referring Provider Junie Spencer, FNP

## 2021-10-31 NOTE — Patient Instructions (Signed)
You have been scheduled for an endoscopy. Please follow written instructions given to you at your visit today. If you use inhalers (even only as needed), please bring them with you on the day of your procedure.  We have sent the following medications to your pharmacy for you to pick up at your convenience: Pantoprazole to replace the omeprazole  Your provider has requested that you go to the basement level for lab work before leaving today. Press "B" on the elevator. The lab is located at the first door on the left as you exit the elevator.  Due to recent changes in healthcare laws, you may see the results of your imaging and laboratory studies on MyChart before your provider has had a chance to review them.  We understand that in some cases there may be results that are confusing or concerning to you. Not all laboratory results come back in the same time frame and the provider may be waiting for multiple results in order to interpret others.  Please give [place] 48 hours in order for your provider to thoroughly review all the results before contacting the office for clarification of your results.   I appreciate the opportunity to care for you. Tye Savoy, NP-C

## 2021-11-01 ENCOUNTER — Encounter: Payer: Self-pay | Admitting: Gastroenterology

## 2021-11-01 ENCOUNTER — Ambulatory Visit (AMBULATORY_SURGERY_CENTER): Payer: Medicare Other | Admitting: Gastroenterology

## 2021-11-01 ENCOUNTER — Other Ambulatory Visit: Payer: Self-pay

## 2021-11-01 VITALS — BP 100/63 | HR 78 | Temp 97.3°F | Resp 13 | Ht <= 58 in | Wt 109.0 lb

## 2021-11-01 DIAGNOSIS — K921 Melena: Secondary | ICD-10-CM | POA: Diagnosis not present

## 2021-11-01 DIAGNOSIS — K449 Diaphragmatic hernia without obstruction or gangrene: Secondary | ICD-10-CM | POA: Diagnosis not present

## 2021-11-01 DIAGNOSIS — K2981 Duodenitis with bleeding: Secondary | ICD-10-CM

## 2021-11-01 DIAGNOSIS — K259 Gastric ulcer, unspecified as acute or chronic, without hemorrhage or perforation: Secondary | ICD-10-CM

## 2021-11-01 DIAGNOSIS — D649 Anemia, unspecified: Secondary | ICD-10-CM

## 2021-11-01 DIAGNOSIS — K2980 Duodenitis without bleeding: Secondary | ICD-10-CM

## 2021-11-01 HISTORY — PX: UPPER GASTROINTESTINAL ENDOSCOPY: SHX188

## 2021-11-01 LAB — IRON,TIBC AND FERRITIN PANEL
%SAT: 3 % (calc) — ABNORMAL LOW (ref 16–45)
Ferritin: 4 ng/mL — ABNORMAL LOW (ref 16–288)
Iron: 11 ug/dL — ABNORMAL LOW (ref 45–160)
TIBC: 437 mcg/dL (calc) (ref 250–450)

## 2021-11-01 MED ORDER — SODIUM CHLORIDE 0.9 % IV SOLN: 500.0000 mL | Freq: Once | INTRAVENOUS | Status: DC

## 2021-11-01 MED ORDER — PANTOPRAZOLE SODIUM 40 MG PO TBEC: 40.0000 mg | DELAYED_RELEASE_TABLET | Freq: Two times a day (BID) | ORAL | 3 refills | Status: DC

## 2021-11-01 NOTE — Progress Notes (Signed)
Reviewed and agree with management plans. ? ?Rafael Salway L. Teondre Jarosz, MD, MPH  ?

## 2021-11-01 NOTE — Progress Notes (Signed)
Pt in recovery with monitors in place, VSS. Report given to receiving RN. Bite guard was placed with pt awake to ensure comfort. No dental or soft tissue damage noted. 

## 2021-11-01 NOTE — Op Note (Addendum)
[name] Procedure Date: 11/01/2021 8:39 AM MRN: [id number] Endoscopist: Thornton Park MD, MD Age: 66 Referring MD:  Date of Birth: 05/06/1956 Gender: Female Account #: [id number] Procedure:                Upper GI endoscopy Indications:              Melena, iron deficiency anemia Medicines:                Monitored Anesthesia Care Procedure:                Pre-Anesthesia Assessment:                           - Prior to the procedure, a History and Physical                            was performed, and patient medications and                            allergies were reviewed. The patient's tolerance of                            previous anesthesia was also reviewed. The risks                            and benefits of the procedure and the sedation                            options and risks were discussed with the patient.                            All questions were answered, and informed consent                            was obtained. Prior Anticoagulants: The patient has                            taken no previous anticoagulant or antiplatelet                            agents. ASA Grade Assessment: III - A patient with                            severe systemic disease. After reviewing the risks                            and benefits, the patient was deemed in                            satisfactory condition to undergo the procedure.                           After obtaining informed consent, the endoscope was  passed under direct vision. Throughout the                            procedure, the patient's blood pressure, pulse, and                            oxygen saturations were monitored continuously. The                            Endoscope was introduced through the mouth, and                            advanced to the third part of duodenum. The upper                            GI endoscopy was  accomplished without difficulty.                            The patient tolerated the procedure well. Scope In: Scope Out: Findings:                 One benign-appearing stricture was found 30 cm from                            the incisors. This stricture measured less than one                            cm (in length). The stricture was traversed without                            any resistance.                           A medium-sized hiatal hernia was present. No                            Cameron's lesions.                           Three non-bleeding superficial gastric ulcers with                            no stigmata of bleeding were found in the gastric                            fundus and in the gastric body. The largest lesion                            was a linear 5 mm ulcer. Biopsies were taken from                            the ulcers, antrum, body, and fundus with a cold  forceps for histology. Estimated blood loss was                            minimal.                           Localized moderately erythematous mucosa with                            active bleeding and with stigmata of bleeding was                            found in the duodenal bulb. No ulcer seen but the                            mucosa through the sweep was very friable. Biopsies                            were taken with a cold forceps for histology.                            Estimated blood loss was minimal. Complications:            No immediate complications. Estimated Blood Loss:     Estimated blood loss was minimal. Impression:               - Benign-appearing esophageal stricture.                           - Medium-sized hiatal hernia without Cameron's                            lesions.                           - Small, superficial non-bleeding gastric ulcers                            with no stigmata of bleeding in the body and                             fundus. Biopsied.                           - Erythematous duodenopathy. Biopsied. Recommendation:           - Patient has a contact number available for                            emergencies. The signs and symptoms of potential                            delayed complications were discussed with the                            patient. Return to normal activities tomorrow.  Written discharge instructions were provided to the                            patient.                           - Resume previous diet.                           - Continue present medications. Increase                            pantoprazole to 40 mg BID.                           - Repeat CBC in 1 week.                           - Start Slow Fe - take one every day. This may make                            the stools dark.                           - Await pathology results.                           - No aspirin, ibuprofen, naproxen, diclofenac or                            other non-steroidal anti-inflammatory drugs. Please                            talk to your doctor about alternatives.                           - Repeat EGD in 8 weeks. Consider using side                            viewing scope at that time to fully assess the                            duodenal sweep.                           - Low threshold to pursue colonoscopy +/-                            cross-sectional imaging as these endoscopic                            findings are overall mild compared to the degree of                            anemia. Tressia DanasKimberly Ty Buntrock MD, MD 11/01/2021 9:14:39 AM This report has been signed electronically.

## 2021-11-01 NOTE — Progress Notes (Signed)
Called to room to assist during endoscopic procedure.  Patient ID and intended procedure confirmed with present staff. Received instructions for my participation in the procedure from the performing physician.  

## 2021-11-01 NOTE — Patient Instructions (Signed)
Increase Pantoprazole to 40 mg twice a day  Repeat CBC in one week   Star slow Fe -take one daily ( over the counter)  NO ASPIRIN IBUPROFEN,NAPROXEN ,DICLOFENAC, OR OTHER NON STEROIDAL ANTI INFLAMMATORY MEDICATIONS   REPEAT UPPER ENDOSCOPY IN 8 WEEKS - DR BEAVERS OFFICE IS SETTING THIS UP     YOU HAD AN ENDOSCOPIC PROCEDURE TODAY AT [place]:   Refer to the procedure report that was given to you for any specific questions about what was found during the examination.  If the procedure report does not answer your questions, please call your gastroenterologist to clarify.  If you requested that your care partner not be given the details of your procedure findings, then the procedure report has been included in a sealed envelope for you to review at your convenience later.  YOU SHOULD EXPECT: Some feelings of bloating in the abdomen. Passage of more gas than usual.  Walking can help get rid of the air that was put into your GI tract during the procedure and reduce the bloating. If you had a lower endoscopy (such as a colonoscopy or flexible sigmoidoscopy) you may notice spotting of blood in your stool or on the toilet paper. If you underwent a bowel prep for your procedure, you may not have a normal bowel movement for a few days.  Please Note:  You might notice some irritation and congestion in your nose or some drainage.  This is from the oxygen used during your procedure.  There is no need for concern and it should clear up in a day or so.  SYMPTOMS TO REPORT IMMEDIATELY:  Following lower endoscopy (colonoscopy or flexible sigmoidoscopy):  Excessive amounts of blood in the stool  Significant tenderness or worsening of abdominal pains  Swelling of the abdomen that is new, acute  Fever of 100F or higher   For urgent or emergent issues, a gastroenterologist can be reached at any hour by calling [phone]. Do not use MyChart messaging for urgent concerns.     DIET:  We do recommend a small meal at first, but then you may proceed to your regular diet.  Drink plenty of fluids but you should avoid alcoholic beverages for 24 hours.  ACTIVITY:  You should plan to take it easy for the rest of today and you should NOT DRIVE or use heavy machinery until tomorrow (because of the sedation medicines used during the test).    FOLLOW UP: Our staff will call the number listed on your records 48-72 hours following your procedure to check on you and address any questions or concerns that you may have regarding the information given to you following your procedure. If we do not reach you, we will leave a message.  We will attempt to reach you two times.  During this call, we will ask if you have developed any symptoms of COVID 19. If you develop any symptoms (ie: fever, flu-like symptoms, shortness of breath, cough etc.) before then, please call [phone].  If you test positive for Covid 19 in the 2 weeks post procedure, please call and report this information to [place].    If any biopsies were taken you will be contacted by phone or by letter within the next 1-3 weeks.  Please call us at [phone] if you have not heard about the biopsies in 3 weeks.    SIGNATURES/CONFIDENTIALITY: You and/or your care partner have signed paperwork which will be entered into your electronic medical  record.  These signatures attest to the fact that that the information above on your After Visit Summary has been reviewed and is understood.  Full responsibility of the confidentiality of this discharge information lies with you and/or your care-partner.

## 2021-11-01 NOTE — Progress Notes (Signed)
D BEAVERS INSTRUCTED A GODDARD THAT OFFICE ,AMYEE WILL ORDER REPEAT ENDOSCOPY FOR 8 WEEKS ,CBC FOR 1 WEEK

## 2021-11-01 NOTE — Progress Notes (Signed)
Vitals-CW ?

## 2021-11-01 NOTE — Progress Notes (Addendum)
Indication for upper endoscopy: Melena, normocytic anemia, frequent NSAIDs  Please see Tracy Jensen office note from 10/31/2021 for complete details.  There is been no change or history of physical exam since that time.  The patient remains an appropriate candidate for monitored anesthesia care in the endoscopy unit today.

## 2021-11-04 ENCOUNTER — Telehealth: Payer: Self-pay | Admitting: *Deleted

## 2021-11-04 ENCOUNTER — Telehealth: Payer: Self-pay

## 2021-11-04 NOTE — Telephone Encounter (Signed)
  Follow up Call-     11/01/2021    7:45 AM  Call back number  Post procedure Call Back phone  # [phone]  Permission to leave phone message No     Patient questions:  Do you have a fever, pain , or abdominal swelling? No. Pain Score  0 *  Have you tolerated food without any problems? Yes.    Have you been able to return to your normal activities? Yes.    Do you have any questions about your discharge instructions: Diet   No. Medications  No. Follow up visit  No.  Do you have questions or concerns about your Care? No.  Actions: * If pain score is 4 or above: No action needed, pain <4.  Pt states going to her Dr office for CBC at the end of the week

## 2021-11-04 NOTE — Telephone Encounter (Signed)
Attempted to call patient for their post-procedure follow-up call. No answer. Left voicemail.   

## 2021-11-07 ENCOUNTER — Other Ambulatory Visit: Payer: Self-pay

## 2021-11-07 ENCOUNTER — Other Ambulatory Visit: Payer: Medicare Other

## 2021-11-07 DIAGNOSIS — D649 Anemia, unspecified: Secondary | ICD-10-CM | POA: Diagnosis not present

## 2021-11-07 DIAGNOSIS — K921 Melena: Secondary | ICD-10-CM

## 2021-11-08 ENCOUNTER — Other Ambulatory Visit: Payer: Self-pay

## 2021-11-08 ENCOUNTER — Encounter: Payer: Self-pay | Admitting: *Deleted

## 2021-11-08 DIAGNOSIS — K921 Melena: Secondary | ICD-10-CM

## 2021-11-08 DIAGNOSIS — D649 Anemia, unspecified: Secondary | ICD-10-CM

## 2021-11-08 LAB — CBC
Hematocrit: 24.2 % — ABNORMAL LOW (ref 34.0–46.6)
Hemoglobin: 7.7 g/dL — CL (ref 11.1–15.9)
MCH: 27.7 pg (ref 26.6–33.0)
MCHC: 31.8 g/dL (ref 31.5–35.7)
MCV: 87 fL (ref 79–97)
Platelets: 395 10*3/uL (ref 150–450)
RBC: 2.78 x10E6/uL — ABNORMAL LOW (ref 3.77–5.28)
RDW: 13.1 % (ref 11.7–15.4)
WBC: 4.4 10*3/uL (ref 3.4–10.8)

## 2021-11-08 MED ORDER — IRON (FERROUS SULFATE) 325 (65 FE) MG PO TABS: 325.0000 mg | ORAL_TABLET | Freq: Every day | ORAL | Status: DC

## 2021-11-12 ENCOUNTER — Telehealth: Payer: Self-pay | Admitting: Hematology and Oncology

## 2021-11-12 NOTE — Telephone Encounter (Signed)
Scheduled appt per 5/26 referral. Pt is aware of appt date and time. Pt is aware to arrive 15 mins prior to appt time and to bring and updated insurance card. Pt is aware of appt location.   

## 2021-11-14 ENCOUNTER — Encounter: Payer: Self-pay | Admitting: Nurse Practitioner

## 2021-11-14 ENCOUNTER — Ambulatory Visit: Payer: Medicare Other | Admitting: Nurse Practitioner

## 2021-11-14 VITALS — BP 102/70 | HR 80 | Ht <= 58 in | Wt 108.0 lb

## 2021-11-14 DIAGNOSIS — D500 Iron deficiency anemia secondary to blood loss (chronic): Secondary | ICD-10-CM

## 2021-11-14 DIAGNOSIS — K279 Peptic ulcer, site unspecified, unspecified as acute or chronic, without hemorrhage or perforation: Secondary | ICD-10-CM | POA: Diagnosis not present

## 2021-11-14 DIAGNOSIS — K449 Diaphragmatic hernia without obstruction or gangrene: Secondary | ICD-10-CM

## 2021-11-14 NOTE — Patient Instructions (Signed)
We will be in contact with an endoscopy appointment in the near future.  Continue twice daily pantoprazole until the time of your endoscopy.  You may continue Aspirin 81 mg daily, but please refrain from NSAID use.  Some examples of NSAID's are as follows: Aspirin (Bufferin, Bayer, and Excedrin) Ibuprofen (Advil, Motrin, Nuprin) Ketoprofen (Actron, Orudis) Naproxen (Aleve) Daypro  Indocin  Lodine  Naprosyn  Relafen  Vimovo Voltaren  If you are age 67 or older, your body mass index should be between 23-30. Your Body mass index is 22.57 kg/m. If this is out of the aforementioned range listed, please consider follow up with your Primary Care Provider.  If you are age 63 or younger, your body mass index should be between 19-25. Your Body mass index is 22.57 kg/m. If this is out of the aformentioned range listed, please consider follow up with your Primary Care Provider.   ________________________________________________________  The [facility] providers would like to encourage you to use [facility] to communicate with providers for non-urgent requests or questions.  Due to long hold times on the telephone, sending your provider a message by [facility] may be a faster and more efficient way to get a response.  Please allow 48 business hours for a response.  Please remember that this is for non-urgent requests.  _______________________________________________________  Due to recent changes in healthcare laws, you may see the results of your imaging and laboratory studies on MyChart before your provider has had a chance to review them.  We understand that in some cases there may be results that are confusing or concerning to you. Not all laboratory results come back in the same time frame and the provider may be waiting for multiple results in order to interpret others.  Please give [place] 48 hours in order for your provider to thoroughly review all the results before contacting the office for  clarification of your results.

## 2021-11-14 NOTE — Progress Notes (Signed)
Assessment   Patient Profile:  Tracy Jensen is a 66 y.o. female known to Dr. Tarri Glenn with a past medical history of anxiety, arthritis, stroke, hypertension, osteoporosis, headaches, hiatal hernia, PUD, possible SBO in 2020.  Additional medical history as listed in [place] .  PUD / melena / IDA in setting of NSAID use.  EGD >> small, superficial non-bleeding gastric ulcers with no stigmata of bleeding. Negative H.pylori. Duodenal biopsies negative for celiac. Melena resolved. Started oral iron on 5/19. No longer taking Diclofenac and now having hip pain. Wants to take Excedrin as needed.     Plan   Needs repeat EGD in 7-8 weeks. Dr. Tarri Glenn wants it to be done with a side viewing scope.She will talk with one of our other Endoscopists about getting this done. We will get back to patient with date / time of procedure    Continue BID Pantoprazole until time of next EGD. After EGD if ulcers healed then may decrease to once daily.  If follow up EGD is negative and she doesn't respond to iron then may need colonoscopy to look for another source of IDA Okay to take daily baby asa for history of CVA. No other NSAIDS.  Will hold off on repeating CBC today as she has been on iron for less than two weeks and hgb on 5/25 was stable at 7.7. Furthermore she is no longer having melena and sees Hematology next week. Continue oral iron.   HPI   Chief Complaint : follow up on GI bleed  [facility] established care here on 10/31/2021 for evaluation of new anemia and recent melena in the setting of diclofenac.  Her hemoglobin had fallen from 12.4 to 7.9 .   Diclofenac had already been stopped prior to her visit here and she has not had any melena in the 3 weeks prior .  EGD done 11/01/21 with findings of a medium size hiatal hernia without Cameron's lesions, small nonbleeding gastric ulcers without stigmata of bleeding.  See full report below. Path showed chronic duodenitis, no celiac disease. Gastric biopsies  unremarkable, negative for H.pylori.  Iron studies were compatible with IDA and she was started on oral iron.   Patient is here for follow up. No further dark stools, not even on iron. No longer taking Diclofenac.  Still takes Excedrin sometimes. Needs to take a baby asa for history of strokes She feels okay other than tired . Taking BID PPI.   Previous GI Evaluation  11/01/21 EGD - Benign-appearing esophageal stricture. - Medium-sized hiatal hernia without Cameron's lesions. - Small, superficial non-bleeding gastric ulcers with no stigmata of bleeding in the body and fundus. Biopsied. - Erythematous duodenopathy. Biopsied Surgical [P], duodenal biopsies CHRONIC DUODENITIS WITH SUPERFICIAL MUCOSAL EROSIONS. THERE ARE NO DIAGNOSTIC FEATURES OF CELIAC DISEASE. NEGATIVE FOR DYSPLASIA AND MALIGNANCY. 2. Surgical [P], fundus, gastric antrum and gastric body FRAGMENTS OF GASTRIC MUCOSA WITH NO SIGNIFICANT MICROSCOPIC ABNORMALITIES. H. PYLORI, INTESTINAL METAPLASIA, ATROPHY AND DYSPLASIA ARE NOT IDENTIFIED.  Labs:     Latest Ref Rng & Units 11/07/2021    9:50 AM 10/28/2021    9:20 AM 10/24/2021    8:40 AM  CBC  WBC 3.4 - 10.8 x10E3/uL 4.4   5.1   4.2    Hemoglobin 11.1 - 15.9 g/dL 7.7   7.5   7.7    Hematocrit 34.0 - 46.6 % 24.2   24.4   24.3    Platelets 150 - 450 x10E3/uL 395   466   450  Latest Ref Rng & Units 09/03/2021   10:19 AM 03/06/2021   10:17 AM 09/21/2020    9:49 AM  Hepatic Function  Total Protein 6.0 - 8.5 g/dL 6.7   6.7   6.3    Albumin 3.8 - 4.8 g/dL 4.4   4.9   4.5    AST 0 - 40 IU/L _0 ALT 0 - 32 IU/L _1 Alk Phosphatase 44 - 121 IU/L 67   75   61    Total Bilirubin 0.0 - 1.2 mg/dL <0.2   0.2   0.2       Past Medical History:  Diagnosis Date   Anemia    Anxiety    Arthritis    Esophageal stricture    Gastric ulcer    GI bleed    Hiatal hernia    Hypertension    Insomnia    Migraines    Sciatica    TIA (transient ischemic  attack)     Past Surgical History:  Procedure Laterality Date   CAROTID ARTERY ANGIOPLASTY  09/2000   CATARACT EXTRACTION, BILATERAL Bilateral    CESAREAN SECTION     1991, 1984   COLONOSCOPY     ROTATOR CUFF REPAIR Right 2020    Current Medications, Allergies, Family History and Social History were reviewed in [employer] record.     Current Outpatient Medications  Medication Sig Dispense Refill   alendronate (FOSAMAX) 70 MG tablet Take 1 tablet (70 mg total) by mouth every 7 (seven) days. Take with a full glass of water on an empty stomach. 12 tablet 1   Ascorbic Acid (VITAMIN C) 100 MG tablet Take 100 mg by mouth daily.     aspirin EC 81 MG tablet Take 81 mg by mouth at bedtime. Swallow whole.     aspirin-acetaminophen-caffeine (EXCEDRIN MIGRAINE) 250-250-65 MG tablet Take by mouth.     busPIRone (BUSPAR) 15 MG tablet Take 1 tablet (15 mg total) by mouth 3 (three) times daily. 270 tablet 3   Calcium Carb-Cholecalciferol (CALCIUM 1000 + D PO) Take by mouth.     cholecalciferol (VITAMIN D3) 25 MCG (1000 UNIT) tablet Take 1,000 Units by mouth daily.     cyclobenzaprine (FLEXERIL) 10 MG tablet Take 1 tablet (10 mg total) by mouth 3 (three) times daily. 90 tablet 3   fluticasone (FLONASE) 50 MCG/ACT nasal spray Place 2 sprays into both nostrils daily. 16 g 6   Iron, Ferrous Sulfate, 325 (65 Fe) MG TABS Take 325 mg by mouth daily.     lisinopril (ZESTRIL) 20 MG tablet Take 1 tablet (20 mg total) by mouth daily. (NEEDS TO BE SEEN BEFORE NEXT REFILL) 90 tablet 3   mirabegron ER (MYRBETRIQ) 50 MG TB24 tablet Take 1 tablet (50 mg total) by mouth daily. 90 tablet 3   pantoprazole (PROTONIX) 40 MG tablet Take 1 tablet (40 mg total) by mouth 2 (two) times daily. 120 tablet 3   polyethylene glycol (MIRALAX / GLYCOLAX) packet Take 17 g by mouth daily as needed for mild constipation or moderate constipation. 14 each 0   traZODone (DESYREL) 50 MG tablet Take 3 tablets (150 mg  total) by mouth at bedtime. 90 tablet 5   zinc gluconate 50 MG tablet Take 50 mg by mouth daily.     No current facility-administered medications for this visit.    Review of Systems: No chest  pain. No shortness of breath. No urinary complaints.    Physical Exam  Wt Readings from Last 3 Encounters:  11/14/21 108 lb (49 kg)  11/01/21 109 lb (49.4 kg)  10/31/21 109 lb 8 oz (49.7 kg)    BP 102/70   Pulse 80   Ht _0  (1.473 m)   Wt 108 lb (49 kg)   BMI 22.57 kg/m  Constitutional:  Generally well appearing female in no acute distress. Psychiatric: Pleasant. Normal mood and affect. Behavior is normal. EENT: Pupils normal.  Conjunctivae are normal. No scleral icterus. Neck supple.  Cardiovascular: Normal rate, regular rhythm. No edema Pulmonary/chest: Effort normal and breath sounds normal. No wheezing, rales or rhonchi. Abdominal: Soft, nondistended, nontender. Bowel sounds active throughout. There are no masses palpable. No hepatomegaly. Neurological: Alert and oriented to person place and time. Skin: Skin is warm and dry. No rashes noted.  Tye Savoy, NP  11/14/2021, 1:40 PM

## 2021-11-15 ENCOUNTER — Encounter: Payer: Self-pay | Admitting: Nurse Practitioner

## 2021-11-15 ENCOUNTER — Ambulatory Visit (INDEPENDENT_AMBULATORY_CARE_PROVIDER_SITE_OTHER): Payer: Medicare Other | Admitting: Nurse Practitioner

## 2021-11-15 VITALS — BP 121/83 | HR 82 | Temp 98.8°F | Ht <= 58 in | Wt 108.4 lb

## 2021-11-15 DIAGNOSIS — M25551 Pain in right hip: Secondary | ICD-10-CM

## 2021-11-15 DIAGNOSIS — M25552 Pain in left hip: Secondary | ICD-10-CM | POA: Diagnosis not present

## 2021-11-15 DIAGNOSIS — G44209 Tension-type headache, unspecified, not intractable: Secondary | ICD-10-CM | POA: Diagnosis not present

## 2021-11-15 MED ORDER — METHYLPREDNISOLONE ACETATE 40 MG/ML IJ SUSP
40.0000 mg | Freq: Once | INTRAMUSCULAR | Status: AC
  Administered 2021-11-15: 40 mg via INTRAMUSCULAR

## 2021-11-15 MED ORDER — BUTALBITAL-APAP-CAFFEINE 50-325-40 MG PO TABS: 1.0000 | ORAL_TABLET | Freq: Four times a day (QID) | ORAL | 0 refills | Status: DC | PRN

## 2021-11-15 NOTE — Patient Instructions (Signed)
Tension Headache, Adult A tension headache is a feeling of pain, pressure, or aching in the head. It is often felt over the front and sides of the head. Tension headaches can last from 30 minutes to several days. What are the causes? The cause of this condition is not known. Sometimes, tension headaches are brought on by stress, worry (anxiety), or depression. Other things that may set them off include: Alcohol. Too much caffeine or caffeine withdrawal. Colds, flu, or sinus infections. Dental problems. This can include clenching your teeth. Being tired. Holding your head and neck in the same position for a long time, such as while using a computer. Smoking. Arthritis in the neck. What are the signs or symptoms? Feeling pressure around the head. A dull ache in the head. Pain over the front and sides of the head. Feeling sore or tender in the muscles of the head, neck, and shoulders. How is this treated? This condition may be treated with lifestyle changes and with medicines that help relieve symptoms. Follow these instructions at home: Managing pain Take over-the-counter and prescription medicines only as told by your doctor. When you have a headache, lie down in a dark, quiet room. If told, put ice on your head and neck. To do this: Put ice in a plastic bag. Place a towel between your skin and the bag. Leave the ice on for 20 minutes, 2-3 times a day. Take off the ice if your skin turns bright red. This is very important. If you cannot feel pain, heat, or cold, you have a greater risk of damage to the area. If told, put heat on the back of your neck. Do this as often as told by your doctor. Use the heat source that your doctor recommends, such as a moist heat pack or a heating pad. Place a towel between your skin and the heat source. Leave the heat on for 20-30 minutes. Take off the heat if your skin turns bright red. This is very important. If you cannot feel pain, heat, or cold, you  have a greater risk of getting burned. Eating and drinking Eat meals on a regular schedule. If you drink alcohol: Limit how much you have to: 0-1 drink a day for women who are not pregnant. 0-2 drinks a day for men. Know how much alcohol is in your drink. In the [place] one drink equals one 12 oz bottle of beer (355 mL), one 5 oz glass of wine (148 mL), or one 1 oz glass of hard liquor (44 mL). Drink enough fluid to keep your pee (urine) pale yellow. Do not use a lot of caffeine, or stop using caffeine. Lifestyle Get 7-9 hours of sleep each night. Or get the amount of sleep that your doctor tells you to. At bedtime, keep computers, phones, and tablets out of your room. Find ways to lessen your stress. This may include: Exercise. Deep breathing. Yoga. Listening to music. Thinking positive thoughts. Sit up straight. Try to relax your muscles. Do not smoke or use any products that contain nicotine or tobacco. If you need help quitting, ask your doctor. General instructions  Avoid things that can bring on headaches. Keep a headache journal to see what may bring on headaches. For example, write down: What you eat and drink. How much sleep you get. Any change to your diet or medicines. Keep all follow-up visits. Contact a doctor if: Your headache does not get better. Your headache comes back. You have a headache, and sounds,   light, or smells bother you. You feel like you may vomit, or you vomit. Your stomach hurts. Get help right away if: You all of a sudden get a very bad headache with any of these things: A stiff neck. Feeling like you may vomit. Vomiting. Feeling mixed up (confused). Feeling weak in one part or one side of your body. Having trouble seeing or speaking, or both. Feeling short of breath. A rash. Feeling very sleepy. Pain in your eye or ear. Trouble walking or balancing. Feeling like you will faint, or you faint. Summary A tension headache is pain, pressure,  or aching in your head. Tension headaches can last from 30 minutes to several days. Lifestyle changes and medicines may help relieve pain. This information is not intended to replace advice given to you by your health care provider. Make sure you discuss any questions you have with your health care provider. Document Revised: 03/01/2020 Document Reviewed: 03/01/2020 Elsevier Patient Education  2023 Elsevier Inc.  

## 2021-11-15 NOTE — Progress Notes (Signed)
Acute Office Visit  Subjective:     Patient ID: Tracy Jensen, female    DOB: 11-18-1955, 66 y.o.   MRN: [id number]  Chief Complaint  Patient presents with   Migraine    Pt states this has been going on for a long time ,    Hip Pain    Both hips    Hip Pain  The incident occurred more than 1 week ago. The incident occurred at home. There was no injury mechanism. The pain is present in the left hip and right hip. The pain is at a severity of 8/10. The pain is severe. The pain has been Constant since onset. Pertinent negatives include no loss of motion, loss of sensation, numbness or tingling. She reports no foreign bodies present. The symptoms are aggravated by movement and palpation. She has tried elevation and ice for the symptoms. The treatment provided no relief.  Migraine  This is a recurrent problem. The current episode started more than 1 month ago. The problem occurs intermittently. The problem has been unchanged. The pain is located in the Temporal region. The pain does not radiate. The pain quality is similar to prior headaches. The quality of the pain is described as aching. The pain is at a severity of 6/10. The pain is moderate. Associated symptoms include photophobia. Pertinent negatives include no abdominal pain, abnormal behavior, blurred vision, dizziness, fever, numbness, tingling or weight loss. Nothing aggravates the symptoms. She has tried NSAIDs for the symptoms. The treatment provided no relief.   Review of Systems  Constitutional: Negative.  Negative for chills, fever and weight loss.  HENT: Negative.    Eyes:  Positive for photophobia. Negative for blurred vision.  Respiratory: Negative.    Cardiovascular: Negative.   Gastrointestinal:  Negative for abdominal pain.  Genitourinary: Negative.   Musculoskeletal:  Positive for joint pain.  Skin: Negative.  Negative for rash.  Neurological:  Negative for dizziness, tingling and numbness.  All other systems reviewed  and are negative.      Objective:    BP 121/83   Pulse 82   Temp 98.8 F (37.1 C)   Ht 4\' 8"  (1.422 m)   Wt 108 lb 6.4 oz (49.2 kg)   SpO2 100%   BMI 24.30 kg/m  BP Readings from Last 3 Encounters:  11/15/21 121/83  11/14/21 102/70  11/01/21 100/63   Wt Readings from Last 3 Encounters:  11/15/21 108 lb 6.4 oz (49.2 kg)  11/14/21 108 lb (49 kg)  11/01/21 109 lb (49.4 kg)      Physical Exam Vitals and nursing note reviewed.  Constitutional:      Appearance: Normal appearance.  HENT:     Head: Normocephalic.     Right Ear: External ear normal.     Left Ear: External ear normal.     Nose: Nose normal.     Mouth/Throat:     Mouth: Mucous membranes are moist.     Pharynx: Oropharynx is clear.  Eyes:     Conjunctiva/sclera: Conjunctivae normal.  Cardiovascular:     Rate and Rhythm: Normal rate and regular rhythm.     Pulses: Normal pulses.     Heart sounds: Normal heart sounds.  Pulmonary:     Effort: Pulmonary effort is normal.     Breath sounds: Normal breath sounds.  Abdominal:     General: Bowel sounds are normal.  Musculoskeletal:     Right upper leg: Tenderness present. No swelling.  Left upper leg: Tenderness present. No swelling.       Legs:     Comments: Bilateral hip pain   Skin:    General: Skin is dry.     Findings: No rash.  Neurological:     Mental Status: She is alert and oriented to person, place, and time.  Psychiatric:        Behavior: Behavior normal.    No results found for any visits on 11/15/21.      Assessment & Plan:   Bilateral hip pain not well controlled. Patient is unable to take any antiinflammatory due to GI complications. -Tylenol as needed -Fioricet for migraine -follow up with worsening unresolved symptoms  Problem List Items Addressed This Visit   None Visit Diagnoses     Tension headache    -  Primary   Relevant Medications   butalbital-acetaminophen-caffeine (FIORICET) 50-325-40 MG tablet   Bilateral  hip pain           Meds ordered this encounter  Medications   butalbital-acetaminophen-caffeine (FIORICET) 50-325-40 MG tablet    Sig: Take 1 tablet by mouth every 6 (six) hours as needed for headache.    Dispense:  14 tablet    Refill:  0    Order Specific Question:   Supervising Provider    Answer:   Claretta Fraise [phone]    Return if symptoms worsen or fail to improve.  Ivy Yomayra, NP

## 2021-11-15 NOTE — Addendum Note (Signed)
Addended by: Waynette Buttery on: 11/15/2021 02:49 PM   Modules accepted: Orders

## 2021-11-18 ENCOUNTER — Ambulatory Visit: Payer: Medicare Other

## 2021-11-18 NOTE — Progress Notes (Signed)
Reviewed with Drs. Mansouraty and Ardis Hughs. They recommend repeating with forward viewing gastroscope first. Please arrange follow-up EGD with me.

## 2021-11-20 ENCOUNTER — Telehealth: Payer: Self-pay

## 2021-11-20 NOTE — Telephone Encounter (Signed)
Pt scheduled for repeat EGD with Dr. Orvan Falconer on 7/27 at 10 am and previsit on 7/5 at 11 am. Pt verbalized understanding and had no other concerns at end of call.

## 2021-11-20 NOTE — Telephone Encounter (Signed)
-----   Message from Meredith Pel, NP sent at 11/18/2021  2:18 PM EDT ----- Verneita Griffes E was working on this, awaiting clarification about the EGD. Dr. Orvan Falconer has clarified she is fine with a regular EGD to be done by her in about 7-8 weeks to follow up on ulcers. Can you see if this is in process of being scheduled. If not will you please schedule. Patient is aware that she needs it. Thanks ----- Message ----- From: Tressia Danas, MD Sent: 11/18/2021   8:32 AM EDT To: Rachael Fee, MD, Meredith Pel, NP, #  My apologizes. I had not yet reached out to anyone. I was concerned that there might be something at the sweep that I was not seeing well in forward only view. We will plan standard repeat EGD and go from there. Thanks, everyone!   KLB ----- Message ----- From: Lemar Lofty., MD Sent: 11/13/2021   5:21 PM EDT To: Rachael Fee, MD, Meredith Pel, NP, #  PG, I had not heard about this patient either.  I agree with DJ, if the patient is doing well and there is no evidence or concern for progressive issues, probably just repeat upper endoscopy with EGD scope at normal time interval.  If things progress or worsen then we could be available if need be. Thanks. GM  ----- Message ----- From: Rachael Fee, MD Sent: 11/13/2021   3:40 PM EDT To: Meredith Pel, NP, #  No, I haven't heard about this.  I also am not sure why the patient needs a duodenoscope for the repeat EGD.  Can anyone else see why?  My outpatient availability is very limited, not sure I can help with this.   ----- Message ----- From: Meredith Pel, NP Sent: 11/13/2021   3:18 PM EDT To: Rachael Fee, MD, #  Hi,  Did Cala Bradford reach out to either one of you about doing a repeat EGD with side viewing scope on this patient in about 8 weeks?  I am seeing her for follow-up tomorrow and I could get it scheduled if I knew that either one of you are willing to do it.  Thanks

## 2021-11-21 ENCOUNTER — Inpatient Hospital Stay: Payer: Medicare Other | Attending: Hematology and Oncology | Admitting: Hematology and Oncology

## 2021-11-21 ENCOUNTER — Inpatient Hospital Stay: Payer: Medicare Other

## 2021-11-21 ENCOUNTER — Other Ambulatory Visit: Payer: Self-pay | Admitting: Lab

## 2021-11-21 ENCOUNTER — Other Ambulatory Visit: Payer: Self-pay

## 2021-11-21 DIAGNOSIS — M810 Age-related osteoporosis without current pathological fracture: Secondary | ICD-10-CM | POA: Diagnosis not present

## 2021-11-21 DIAGNOSIS — Z833 Family history of diabetes mellitus: Secondary | ICD-10-CM | POA: Diagnosis not present

## 2021-11-21 DIAGNOSIS — D500 Iron deficiency anemia secondary to blood loss (chronic): Secondary | ICD-10-CM | POA: Insufficient documentation

## 2021-11-21 DIAGNOSIS — D539 Nutritional anemia, unspecified: Secondary | ICD-10-CM

## 2021-11-21 DIAGNOSIS — Z79899 Other long term (current) drug therapy: Secondary | ICD-10-CM | POA: Insufficient documentation

## 2021-11-21 DIAGNOSIS — D509 Iron deficiency anemia, unspecified: Secondary | ICD-10-CM | POA: Diagnosis not present

## 2021-11-21 LAB — CBC WITH DIFFERENTIAL (CANCER CENTER ONLY)
Abs Immature Granulocytes: 0.02 10*3/uL (ref 0.00–0.07)
Basophils Absolute: 0 10*3/uL (ref 0.0–0.1)
Basophils Relative: 1 %
Eosinophils Absolute: 0.1 10*3/uL (ref 0.0–0.5)
Eosinophils Relative: 1 %
HCT: 33.7 % — ABNORMAL LOW (ref 36.0–46.0)
Hemoglobin: 10.5 g/dL — ABNORMAL LOW (ref 12.0–15.0)
Immature Granulocytes: 0 %
Lymphocytes Relative: 26 %
Lymphs Abs: 1.4 10*3/uL (ref 0.7–4.0)
MCH: 28.2 pg (ref 26.0–34.0)
MCHC: 31.2 g/dL (ref 30.0–36.0)
MCV: 90.6 fL (ref 80.0–100.0)
Monocytes Absolute: 0.4 10*3/uL (ref 0.1–1.0)
Monocytes Relative: 8 %
Neutro Abs: 3.4 10*3/uL (ref 1.7–7.7)
Neutrophils Relative %: 64 %
Platelet Count: 341 10*3/uL (ref 150–400)
RBC: 3.72 MIL/uL — ABNORMAL LOW (ref 3.87–5.11)
RDW: 17.7 % — ABNORMAL HIGH (ref 11.5–15.5)
WBC Count: 5.3 10*3/uL (ref 4.0–10.5)
nRBC: 0 % (ref 0.0–0.2)

## 2021-11-21 LAB — ABO/RH: ABO/RH(D): A POS

## 2021-11-21 LAB — RETICULOCYTES
Immature Retic Fract: 20 % — ABNORMAL HIGH (ref 2.3–15.9)
RBC.: 3.63 MIL/uL — ABNORMAL LOW (ref 3.87–5.11)
Retic Count, Absolute: 55.2 10*3/uL (ref 19.0–186.0)
Retic Ct Pct: 1.5 % (ref 0.4–3.1)

## 2021-11-21 LAB — IRON AND IRON BINDING CAPACITY (CC-WL,HP ONLY)
Iron: 124 ug/dL (ref 28–170)
Saturation Ratios: 27 % (ref 10.4–31.8)
TIBC: 463 ug/dL — ABNORMAL HIGH (ref 250–450)
UIBC: 339 ug/dL (ref 148–442)

## 2021-11-21 LAB — VITAMIN B12: Vitamin B-12: 2370 pg/mL — ABNORMAL HIGH (ref 180–914)

## 2021-11-21 NOTE — Progress Notes (Signed)
[facility] CONSULT NOTE  Patient Care Team: Sharion Balloon, FNP as PCP - General (Family Medicine) Harlen Labs, MD as Referring Physician (Optometry) Garald Balding, MD as Consulting Physician (Orthopedic Surgery)  ASSESSMENT & PLAN:  Iron deficiency anemia due to chronic blood loss Her iron deficiency anemia is resolving since discontinuation of prednisone, diclofenac as well as oral iron intake I recommend she continues taking oral iron supplement daily until she sees her primary care doctor in September I am positive by then, her anemia would have resolved We discussed the importance of avoiding NSAID in the future I recommend the patient to hold off taking aspirin for few days along with holding off taking Fosamax until her next EGD evaluation to speed up the wound healing in her stomach  Orders Placed This Encounter  Procedures   CBC with Differential ([facility] Only)    Standing Status:   Future    Number of Occurrences:   1    Standing Expiration Date:   11/22/2022   Iron and Iron Binding Capacity ([facility] only)    Standing Status:   Future    Number of Occurrences:   1    Standing Expiration Date:   11/22/2022   Ferritin    Standing Status:   Future    Number of Occurrences:   1    Standing Expiration Date:   11/21/2022   Vitamin B12    Standing Status:   Future    Number of Occurrences:   1    Standing Expiration Date:   11/22/2022   Reticulocytes    Standing Status:   Future    Number of Occurrences:   1    Standing Expiration Date:   11/22/2022   ABO/Rh    Standing Status:   Future    Number of Occurrences:   1    Standing Expiration Date:   11/22/2022    All questions were answered. The patient knows to call the clinic with any problems, questions or concerns.  The total time spent in the appointment was 55 minutes encounter with patients including review of chart and various tests results, discussions about plan of care and coordination of  care plan  Heath Lark, MD 6/9/202311:55 AM   CHIEF COMPLAINTS/PURPOSE OF CONSULTATION:  Anemia  HISTORY OF PRESENTING ILLNESS:  Tracy Jensen 66 y.o. female is here because of anemia  She was found to have abnormal CBC from recently when she presented with melena The patient had history of mini stroke in 2002 and has been taking antiplatelet agent long-term Last year, she was found to have osteoporosis and was prescribed Fosamax Due to recent hip pain, she was prescribed prednisone and she has been also taking diclofenac on a regular basis since the year of 2020 Over the years, she had brief episode of intermittent anemia but most recently was found to have severe iron deficiency anemia  She denies recent chest pain on exertion, shortness of breath on minimal exertion, pre-syncopal episodes, or palpitations. She had no prior history or diagnosis of cancer. Her age appropriate screening programs are up-to-date. She denies any pica and eats a variety of diet. She never donated blood or received blood transfusion The patient was prescribed oral iron supplements and she takes oral iron supplements since her recent procedure Her last colonoscopy was in 2015 On 11/01/2021, she underwent EGD evaluation - Benign-appearing esophageal stricture. - Medium-sized hiatal hernia without Cameron's lesions. - Small, superficial non-bleeding  gastric ulcers with no stigmata of bleeding in the body and fundus. Biopsied. - Erythematous duodenopathy. Biopsied.  She is currently taking pantoprazole.  She has stopped taking prednisone as well as diclofenac  MEDICAL HISTORY:  Past Medical History:  Diagnosis Date   Anemia    Anxiety    Arthritis    Esophageal stricture    Gastric ulcer    GI bleed    Hiatal hernia    Hypertension    Insomnia    Migraines    Sciatica    TIA (transient ischemic attack)     SURGICAL HISTORY: Past Surgical History:  Procedure Laterality Date   CAROTID ARTERY  ANGIOPLASTY  09/2000   CATARACT EXTRACTION, BILATERAL Bilateral    CESAREAN SECTION     1991, 1984   COLONOSCOPY     ROTATOR CUFF REPAIR Right 2020    SOCIAL HISTORY: Social History   Socioeconomic History   Marital status: Married    Spouse name: Not on file   Number of children: 2   Years of education: Not on file   Highest education level: Not on file  Occupational History   Occupation: retired  Tobacco Use   Smoking status: Never   Smokeless tobacco: Never  Vaping Use   Vaping Use: Never used  Substance and Sexual Activity   Alcohol use: Yes    Comment: occ   Drug use: No   Sexual activity: Not on file  Other Topics Concern   Not on file  Social History Narrative   Not on file   Social Determinants of Health   Financial Resource Strain: Low Risk  (05/06/2021)   Overall Financial Resource Strain (CARDIA)    Difficulty of Paying Living Expenses: Not hard at all  Food Insecurity: No Food Insecurity (05/06/2021)   Hunger Vital Sign    Worried About Running Out of Food in the Last Year: Never true    Ran Out of Food in the Last Year: Never true  Transportation Needs: No Transportation Needs (05/06/2021)   PRAPARE - Hydrologist (Medical): No    Lack of Transportation (Non-Medical): No  Physical Activity: Insufficiently Active (05/06/2021)   Exercise Vital Sign    Days of Exercise per Week: 7 days    Minutes of Exercise per Session: 10 min  Stress: No Stress Concern Present (05/06/2021)   [place]    Feeling of Stress : Only a little  Social Connections: Socially Integrated (05/06/2021)   Social Connection and Isolation Panel [NHANES]    Frequency of Communication with Friends and Family: More than three times a week    Frequency of Social Gatherings with Friends and Family: More than three times a week    Attends Religious Services: More than 4 times per year     Active Member of [employer] or Organizations: Yes    Attends Music therapist: More than 4 times per year    Marital Status: Married  Human resources officer Violence: Not At Risk (05/06/2021)   Humiliation, Afraid, Rape, and Kick questionnaire    Fear of Current or Ex-Partner: No    Emotionally Abused: No    Physically Abused: No    Sexually Abused: No    FAMILY HISTORY: Family History  Problem Relation Age of Onset   Diabetes Mother    Asthma Mother    Hypertension Mother    Hypertension Father    Heart disease Father  ALLERGIES:  is allergic to azithromycin and omeprazole.  MEDICATIONS:  Current Outpatient Medications  Medication Sig Dispense Refill   alendronate (FOSAMAX) 70 MG tablet Take 1 tablet (70 mg total) by mouth every 7 (seven) days. Take with a full glass of water on an empty stomach. 12 tablet 1   Ascorbic Acid (VITAMIN C) 100 MG tablet Take 100 mg by mouth daily.     aspirin EC 81 MG tablet Take 81 mg by mouth at bedtime. Swallow whole.     busPIRone (BUSPAR) 15 MG tablet Take 1 tablet (15 mg total) by mouth 3 (three) times daily. 270 tablet 3   butalbital-acetaminophen-caffeine (FIORICET) 50-325-40 MG tablet Take 1 tablet by mouth every 6 (six) hours as needed for headache. 14 tablet 0   Calcium Carb-Cholecalciferol (CALCIUM 1000 + D PO) Take by mouth.     cholecalciferol (VITAMIN D3) 25 MCG (1000 UNIT) tablet Take 1,000 Units by mouth daily.     cyclobenzaprine (FLEXERIL) 10 MG tablet Take 1 tablet (10 mg total) by mouth 3 (three) times daily. 90 tablet 3   fluticasone (FLONASE) 50 MCG/ACT nasal spray Place 2 sprays into both nostrils daily. 16 g 6   Iron, Ferrous Sulfate, 325 (65 Fe) MG TABS Take 325 mg by mouth daily.     lisinopril (ZESTRIL) 20 MG tablet Take 1 tablet (20 mg total) by mouth daily. (NEEDS TO BE SEEN BEFORE NEXT REFILL) 90 tablet 3   mirabegron ER (MYRBETRIQ) 50 MG TB24 tablet Take 1 tablet (50 mg total) by mouth daily. 90 tablet 3    pantoprazole (PROTONIX) 40 MG tablet Take 1 tablet (40 mg total) by mouth 2 (two) times daily. 120 tablet 3   polyethylene glycol (MIRALAX / GLYCOLAX) packet Take 17 g by mouth daily as needed for mild constipation or moderate constipation. 14 each 0   traZODone (DESYREL) 50 MG tablet Take 3 tablets (150 mg total) by mouth at bedtime. 90 tablet 5   zinc gluconate 50 MG tablet Take 50 mg by mouth daily.     No current facility-administered medications for this visit.    REVIEW OF SYSTEMS:   Constitutional: Denies fevers, chills or abnormal night sweats Eyes: Denies blurriness of vision, double vision or watery eyes Ears, nose, mouth, throat, and face: Denies mucositis or sore throat Respiratory: Denies cough, dyspnea or wheezes Cardiovascular: Denies palpitation, chest discomfort or lower extremity swelling Gastrointestinal:  Denies nausea, heartburn or change in bowel habits Skin: Denies abnormal skin rashes Lymphatics: Denies new lymphadenopathy or easy bruising Neurological:Denies numbness, tingling or new weaknesses Behavioral/Psych: Mood is stable, no new changes  All other systems were reviewed with the patient and are negative.  PHYSICAL EXAMINATION: ECOG PERFORMANCE STATUS: 0 - Asymptomatic  Vitals:   11/21/21 1346  BP: 139/85  Pulse: 87  Resp: 18  Temp: 97.8 F (36.6 C)  SpO2: 100%   Filed Weights   11/21/21 1346  Weight: 107 lb 9.6 oz (48.8 kg)    GENERAL:alert, no distress and comfortable SKIN: skin color, texture, turgor are normal, no rashes or significant lesions EYES: normal, conjunctiva are pink and non-injected, sclera clear OROPHARYNX:no exudate, no erythema and lips, buccal mucosa, and tongue normal  NECK: supple, thyroid normal size, non-tender, without nodularity LYMPH:  no palpable lymphadenopathy in the cervical, axillary or inguinal LUNGS: clear to auscultation and percussion with normal breathing effort HEART: regular rate & rhythm and no murmurs  and no lower extremity edema ABDOMEN:abdomen soft, non-tender and normal bowel sounds  Musculoskeletal:no cyanosis of digits and no clubbing  PSYCH: alert & oriented x 3 with fluent speech NEURO: no focal motor/sensory deficits

## 2021-11-22 ENCOUNTER — Encounter: Payer: Self-pay | Admitting: Hematology and Oncology

## 2021-11-22 LAB — FERRITIN: Ferritin: 32 ng/mL (ref 11–307)

## 2021-11-22 NOTE — Assessment & Plan Note (Signed)
Her iron deficiency anemia is resolving since discontinuation of prednisone, diclofenac as well as oral iron intake I recommend she continues taking oral iron supplement daily until she sees her primary care doctor in September I am positive by then, her anemia would have resolved We discussed the importance of avoiding NSAID in the future I recommend the patient to hold off taking aspirin for few days along with holding off taking Fosamax until her next EGD evaluation to speed up the wound healing in her stomach

## 2021-12-18 ENCOUNTER — Ambulatory Visit (AMBULATORY_SURGERY_CENTER): Payer: Self-pay | Admitting: *Deleted

## 2021-12-18 VITALS — Ht <= 58 in | Wt 108.0 lb

## 2021-12-18 DIAGNOSIS — K259 Gastric ulcer, unspecified as acute or chronic, without hemorrhage or perforation: Secondary | ICD-10-CM

## 2021-12-18 NOTE — Progress Notes (Signed)
Patient is here in-person for PV. Patient denies any allergies to eggs or soy. Patient denies any problems with anesthesia/sedation. Patient is not on any oxygen at home. Patient is not taking any diet/weight loss medications or blood thinners. Went over procedure prep instructions with the patient. Patient is aware of our care-partner policy. Patient denies any medical hx changes since last GI OV.

## 2022-01-03 ENCOUNTER — Encounter: Payer: Self-pay | Admitting: Gastroenterology

## 2022-01-09 ENCOUNTER — Other Ambulatory Visit: Payer: Self-pay | Admitting: *Deleted

## 2022-01-09 ENCOUNTER — Encounter: Payer: Self-pay | Admitting: Gastroenterology

## 2022-01-09 ENCOUNTER — Ambulatory Visit (AMBULATORY_SURGERY_CENTER): Payer: Medicare Other | Admitting: Gastroenterology

## 2022-01-09 VITALS — BP 126/74 | HR 72 | Temp 96.8°F | Resp 19 | Ht <= 58 in | Wt 108.0 lb

## 2022-01-09 DIAGNOSIS — K222 Esophageal obstruction: Secondary | ICD-10-CM

## 2022-01-09 DIAGNOSIS — K259 Gastric ulcer, unspecified as acute or chronic, without hemorrhage or perforation: Secondary | ICD-10-CM

## 2022-01-09 DIAGNOSIS — K449 Diaphragmatic hernia without obstruction or gangrene: Secondary | ICD-10-CM | POA: Diagnosis not present

## 2022-01-09 MED ORDER — PANTOPRAZOLE SODIUM 40 MG PO TBEC: 40.0000 mg | DELAYED_RELEASE_TABLET | Freq: Every day | ORAL | 1 refills | Status: DC

## 2022-01-09 MED ORDER — SODIUM CHLORIDE 0.9 % IV SOLN: 500.0000 mL | Freq: Once | INTRAVENOUS | Status: DC

## 2022-01-09 NOTE — Progress Notes (Signed)
Referring Provider: Junie Spencer, FNP Primary Care Physician:  Junie Spencer, FNP  Indication for Procedure:  Gastric ulcers   IMPRESSION:  Gastric ulcers with prior gastric biopsies negative for H pylori Appropriate candidate for monitored anesthesia care  PLAN: EGD in the [facility] today   HPI: Tracy Jensen is a 66 y.o. female presents for endoscopic surveillance of gastric ulcers.  EGD 11/01/21: - One benign-appearing stricture was found 30 cm from the incisors. This stricture measured less than one cm (in length). The stricture was traversed without any resistance. - A medium-sized hiatal hernia was present. No Cameron's lesions. - Three non-bleeding superficial gastric ulcers with no stigmata of bleeding were found in the gastric fundus and in the gastric body. The largest lesion was a linear 5 mm ulcer. Biopsies were taken from the ulcers, antrum, body, and fundus with a cold forceps for histology. Estimated blood loss was minimal. - Localized moderately erythematous mucosa with active bleeding and with stigmata of bleeding was found in the duodenal bulb. No ulcer seen but the mucosa through the sweep was very friable. Biopsies were taken with a cold forceps for histology. Estimated blood loss was minimal.  Pathology results from 11/01/21 EGD: 1. Surgical [P], duodenal biopsies CHRONIC DUODENITIS WITH SUPERFICIAL MUCOSAL EROSIONS. THERE ARE NO DIAGNOSTIC FEATURES OF CELIAC DISEASE. NEGATIVE FOR DYSPLASIA AND MALIGNANCY. 2. Surgical [P], fundus, gastric antrum and gastric body FRAGMENTS OF GASTRIC MUCOSA WITH NO SIGNIFICANT MICROSCOPIC ABNORMALITIES. H. PYLORI, INTESTINAL METAPLASIA, ATROPHY AND DYSPLASIA ARE NOT IDENTIFIED.    Past Medical History:  Diagnosis Date   Anemia    Anxiety    Arthritis    Esophageal stricture    Gastric ulcer    GI bleed    Hiatal hernia    Hypertension    Insomnia    Migraines    Sciatica    TIA (transient ischemic attack)      Past Surgical History:  Procedure Laterality Date   CAROTID ARTERY ANGIOPLASTY  09/2000   CATARACT EXTRACTION, BILATERAL Bilateral    CESAREAN SECTION     1991, 1984   COLONOSCOPY     ROTATOR CUFF REPAIR Right 2020   UPPER GASTROINTESTINAL ENDOSCOPY  11/01/2021   Dr.Zinia Innocent    Current Outpatient Medications  Medication Sig Dispense Refill   busPIRone (BUSPAR) 15 MG tablet Take 1 tablet (15 mg total) by mouth 3 (three) times daily. 270 tablet 3   Calcium Carb-Cholecalciferol (CALCIUM 1000 + D PO) Take by mouth.     cholecalciferol (VITAMIN D3) 25 MCG (1000 UNIT) tablet Take 1,000 Units by mouth daily.     Cyanocobalamin (VITAMIN B-12 PO) Take by mouth.     cyclobenzaprine (FLEXERIL) 10 MG tablet Take 1 tablet (10 mg total) by mouth 3 (three) times daily. 90 tablet 3   fluticasone (FLONASE) 50 MCG/ACT nasal spray Place 2 sprays into both nostrils daily. 16 g 6   Iron, Ferrous Sulfate, 325 (65 Fe) MG TABS Take 325 mg by mouth daily. (Patient taking differently: Take 325 mg by mouth daily. Take 30 min before lunch)     lisinopril (ZESTRIL) 20 MG tablet Take 1 tablet (20 mg total) by mouth daily. (NEEDS TO BE SEEN BEFORE NEXT REFILL) 90 tablet 3   mirabegron ER (MYRBETRIQ) 50 MG TB24 tablet Take 1 tablet (50 mg total) by mouth daily. 90 tablet 3   pantoprazole (PROTONIX) 40 MG tablet Take 1 tablet (40 mg total) by mouth 2 (two) times daily. 120 tablet 3  polyethylene glycol (MIRALAX / GLYCOLAX) packet Take 17 g by mouth daily as needed for mild constipation or moderate constipation. 14 each 0   traZODone (DESYREL) 50 MG tablet Take 3 tablets (150 mg total) by mouth at bedtime. 90 tablet 5   alendronate (FOSAMAX) 70 MG tablet Take 1 tablet (70 mg total) by mouth every 7 (seven) days. Take with a full glass of water on an empty stomach. (Patient not taking: Reported on 12/18/2021) 12 tablet 1   aspirin EC 81 MG tablet Take 81 mg by mouth at bedtime. Swallow whole. (Patient not taking:  Reported on 12/18/2021)     butalbital-acetaminophen-caffeine (FIORICET) 50-325-40 MG tablet Take 1 tablet by mouth every 6 (six) hours as needed for headache. 14 tablet 0   Current Facility-Administered Medications  Medication Dose Route Frequency Provider Last Rate Last Admin   0.9 %  sodium chloride infusion  500 mL Intravenous Once Tressia Danas, MD        Allergies as of 01/09/2022 - Review Complete 01/09/2022  Allergen Reaction Noted   Azithromycin Hives 04/02/2016   Omeprazole Other (See Comments) 10/31/2021    Family History  Problem Relation Age of Onset   Diabetes Mother    Asthma Mother    Hypertension Mother    Hypertension Father    Heart disease Father    Colon cancer Neg Hx    Esophageal cancer Neg Hx    Stomach cancer Neg Hx      Physical Exam: General:   Alert,  well-nourished, pleasant and cooperative in NAD Head:  Normocephalic and atraumatic. Eyes:  Sclera clear, no icterus.   Conjunctiva pink. Mouth:  No deformity or lesions.   Neck:  Supple; no masses or thyromegaly. Lungs:  Clear throughout to auscultation.   No wheezes. Heart:  Regular rate and rhythm; no murmurs. Abdomen:  Soft, non-tender, nondistended, normal bowel sounds, no rebound or guarding.  Msk:  Symmetrical. No boney deformities LAD: No inguinal or umbilical LAD Extremities:  No clubbing or edema. Neurologic:  Alert and  oriented x4;  grossly nonfocal Skin:  No obvious rash or bruise. Psych:  Alert and cooperative. Normal mood and affect.     Studies/Results: No results found.    Freddye Cardamone L. Orvan Falconer, MD, MPH 01/09/2022, 10:42 AM

## 2022-01-09 NOTE — Progress Notes (Signed)
AS-vitals  Pt's states no medical or surgical changes since previsit or office visit.

## 2022-01-09 NOTE — Op Note (Addendum)
[facility] Patient Name: Tracy Jensen Procedure Date: 01/09/2022 10:47 AM MRN: [id number] Endoscopist: Tressia Danas MD, MD Age: 66 Referring MD:  Date of Birth: [birth year] Gender: Female Account #: [id number] Procedure:                Upper GI endoscopy Indications:              Follow-up of acute gastric ulcer                           Multiple gastric body and fundus ulcers on EGD                            10/2021, biopsies negative for H pylori Medicines:                Monitored Anesthesia Care Procedure:                Pre-Anesthesia Assessment:                           - Prior to the procedure, a History and Physical                            was performed, and patient medications and                            allergies were reviewed. The patient's tolerance of                            previous anesthesia was also reviewed. The risks                            and benefits of the procedure and the sedation                            options and risks were discussed with the patient.                            All questions were answered, and informed consent                            was obtained. Prior Anticoagulants: The patient has                            taken no previous anticoagulant or antiplatelet                            agents. ASA Grade Assessment: III - A patient with                            severe systemic disease. After reviewing the risks                            and benefits, the patient was deemed in  satisfactory condition to undergo the procedure.                           After obtaining informed consent, the endoscope was                            passed under direct vision. Throughout the                            procedure, the patient's blood pressure, pulse, and                            oxygen saturations were monitored continuously. The                            GIF [id number] was  introduced through the                            mouth, and advanced to the third part of duodenum.                            The upper GI endoscopy was accomplished without                            difficulty. The patient tolerated the procedure                            well. Scope In: Scope Out: Findings:                 Widely patent Schatzki's ring located 30 cm from                            the incisors.                           Localized mild mucosal changes characterized by                            scarring (post ulcer) were found in the gastric                            fundus and in the gastric body.                           Localized mildly erythematous mucosa without active                            bleeding and with no stigmata of bleeding was found                            in the duodenal bulb.                           A medium-sized hiatal hernia was present. Complications:  No immediate complications. Estimated Blood Loss:     Estimated blood loss: none. Impression:               - Widely patent Schatzki's ring.                           - Healed gastric ulcers now with some residual                            scarring.                           - Erythematous duodenopathy.                           - Medium-sized hiatal hernia.                           - No specimens collected. Recommendation:           - Patient has a contact number available for                            emergencies. The signs and symptoms of potential                            delayed complications were discussed with the                            patient. Return to normal activities tomorrow.                            Written discharge instructions were provided to the                            patient.                           - Resume previous diet.                           - Continue present medications. Reduce pantoprazole                            to 40 mg  QAM. If asymptomatic after 2 weeks, reduce                            pantoprazole to 40 mg every other day for one                            additional week prior to discontinuation. Resume                            the medication if not tolerated.                           - No aspirin, ibuprofen, naproxen, or other  non-steroidal anti-inflammatory drugs. Thornton Park MD, MD 01/09/2022 11:09:59 AM This report has been signed electronically.

## 2022-01-09 NOTE — Patient Instructions (Signed)
REDUCE pantoprazole to 40 mg once every morning.  YOU HAD AN ENDOSCOPIC PROCEDURE TODAY AT THE [facility]:   Refer to the procedure report that was given to you for any specific questions about what was found during the examination.  If the procedure report does not answer your questions, please call your gastroenterologist to clarify.  If you requested that your care partner not be given the details of your procedure findings, then the procedure report has been included in a sealed envelope for you to review at your convenience later.  YOU SHOULD EXPECT: Some feelings of bloating in the abdomen. Passage of more gas than usual.  Walking can help get rid of the air that was put into your GI tract during the procedure and reduce the bloating. If you had a lower endoscopy (such as a colonoscopy or flexible sigmoidoscopy) you may notice spotting of blood in your stool or on the toilet paper. If you underwent a bowel prep for your procedure, you may not have a normal bowel movement for a few days.  Please Note:  You might notice some irritation and congestion in your nose or some drainage.  This is from the oxygen used during your procedure.  There is no need for concern and it should clear up in a day or so.  SYMPTOMS TO REPORT IMMEDIATELY:   Following upper endoscopy (EGD)  Vomiting of blood or coffee ground material  New chest pain or pain under the shoulder blades  Painful or persistently difficult swallowing  New shortness of breath  Fever of 100F or higher  Black, tarry-looking stools  For urgent or emergent issues, a gastroenterologist can be reached at any hour by calling [phone]. Do not use MyChart messaging for urgent concerns.    DIET:  We do recommend a small meal at first, but then you may proceed to your regular diet.  Drink plenty of fluids but you should avoid alcoholic beverages for 24 hours.  ACTIVITY:  You should plan to take it easy for the rest of today  and you should NOT DRIVE or use heavy machinery until tomorrow (because of the sedation medicines used during the test).    FOLLOW UP: Our staff will call the number listed on your records the next business day following your procedure.  We will call around 7:15- 8:00 am to check on you and address any questions or concerns that you may have regarding the information given to you following your procedure. If we do not reach you, we will leave a message.  If you develop any symptoms (ie: fever, flu-like symptoms, shortness of breath, cough etc.) before then, please call [phone].  If you test positive for Covid 19 in the 2 weeks post procedure, please call and report this information to [place].    If any biopsies were taken you will be contacted by phone or by letter within the next 1-3 weeks.  Please call us at [phone] if you have not heard about the biopsies in 3 weeks.    SIGNATURES/CONFIDENTIALITY: You and/or your care partner have signed paperwork which will be entered into your electronic medical record.  These signatures attest to the fact that that the information above on your After Visit Summary has been reviewed and is understood.  Full responsibility of the confidentiality of this discharge information lies with you and/or your care-partner.

## 2022-01-09 NOTE — Progress Notes (Signed)
PT taken to PACU. Monitors in place. VSS. Report given to RN. 

## 2022-01-10 ENCOUNTER — Telehealth: Payer: Self-pay

## 2022-01-10 NOTE — Telephone Encounter (Signed)
  Follow up Call-     01/09/2022    9:41 AM 01/09/2022    9:32 AM 11/01/2021    7:45 AM  Call back number  Post procedure Call Back phone  # [phone]  [phone]  Permission to leave phone message No Yes No  comments No messages     Follow-up call, no answer and did not give permission to leave a phone message.

## 2022-03-07 ENCOUNTER — Encounter: Payer: Self-pay | Admitting: Family

## 2022-03-07 ENCOUNTER — Ambulatory Visit (INDEPENDENT_AMBULATORY_CARE_PROVIDER_SITE_OTHER): Payer: Medicare Other | Admitting: Family

## 2022-03-07 ENCOUNTER — Other Ambulatory Visit: Payer: Self-pay | Admitting: Nurse Practitioner

## 2022-03-07 VITALS — BP 129/84 | HR 73 | Temp 97.6°F | Ht <= 58 in | Wt 113.8 lb

## 2022-03-07 DIAGNOSIS — F320 Major depressive disorder, single episode, mild: Secondary | ICD-10-CM

## 2022-03-07 DIAGNOSIS — K259 Gastric ulcer, unspecified as acute or chronic, without hemorrhage or perforation: Secondary | ICD-10-CM

## 2022-03-07 DIAGNOSIS — N3281 Overactive bladder: Secondary | ICD-10-CM

## 2022-03-07 DIAGNOSIS — G44209 Tension-type headache, unspecified, not intractable: Secondary | ICD-10-CM

## 2022-03-07 DIAGNOSIS — Z8673 Personal history of transient ischemic attack (TIA), and cerebral infarction without residual deficits: Secondary | ICD-10-CM | POA: Diagnosis not present

## 2022-03-07 DIAGNOSIS — M159 Polyosteoarthritis, unspecified: Secondary | ICD-10-CM | POA: Diagnosis not present

## 2022-03-07 DIAGNOSIS — F411 Generalized anxiety disorder: Secondary | ICD-10-CM

## 2022-03-07 DIAGNOSIS — D500 Iron deficiency anemia secondary to blood loss (chronic): Secondary | ICD-10-CM | POA: Diagnosis not present

## 2022-03-07 DIAGNOSIS — Z23 Encounter for immunization: Secondary | ICD-10-CM | POA: Diagnosis not present

## 2022-03-07 DIAGNOSIS — G8929 Other chronic pain: Secondary | ICD-10-CM | POA: Diagnosis not present

## 2022-03-07 DIAGNOSIS — I10 Essential (primary) hypertension: Secondary | ICD-10-CM

## 2022-03-07 DIAGNOSIS — M5441 Lumbago with sciatica, right side: Secondary | ICD-10-CM

## 2022-03-07 DIAGNOSIS — G4700 Insomnia, unspecified: Secondary | ICD-10-CM | POA: Diagnosis not present

## 2022-03-07 LAB — CMP14+EGFR
ALT: 15 IU/L (ref 0–32)
AST: 18 IU/L (ref 0–40)
Albumin/Globulin Ratio: 2.4 — ABNORMAL HIGH (ref 1.2–2.2)
Albumin: 4.7 g/dL (ref 3.9–4.9)
Alkaline Phosphatase: 46 IU/L (ref 44–121)
BUN/Creatinine Ratio: 30 — ABNORMAL HIGH (ref 12–28)
BUN: 24 mg/dL (ref 8–27)
Bilirubin Total: 0.3 mg/dL (ref 0.0–1.2)
CO2: 27 mmol/L (ref 20–29)
Calcium: 9.8 mg/dL (ref 8.7–10.3)
Chloride: 101 mmol/L (ref 96–106)
Creatinine, Ser: 0.81 mg/dL (ref 0.57–1.00)
Globulin, Total: 2 g/dL (ref 1.5–4.5)
Glucose: 85 mg/dL (ref 70–99)
Potassium: 4.4 mmol/L (ref 3.5–5.2)
Sodium: 141 mmol/L (ref 134–144)
Total Protein: 6.7 g/dL (ref 6.0–8.5)
eGFR: 80 mL/min/{1.73_m2} (ref >59)

## 2022-03-07 LAB — CBC WITH DIFFERENTIAL/PLATELET
Basophils Absolute: 0 10*3/uL (ref 0.0–0.2)
Basos: 1 %
EOS (ABSOLUTE): 0.1 10*3/uL (ref 0.0–0.4)
Eos: 3 %
Hematocrit: 37.4 % (ref 34.0–46.6)
Hemoglobin: 12.7 g/dL (ref 11.1–15.9)
Immature Grans (Abs): 0 10*3/uL (ref 0.0–0.1)
Immature Granulocytes: 0 %
Lymphocytes Absolute: 1.5 10*3/uL (ref 0.7–3.1)
Lymphs: 36 %
MCH: 30.2 pg (ref 26.6–33.0)
MCHC: 34 g/dL (ref 31.5–35.7)
MCV: 89 fL (ref 79–97)
Monocytes Absolute: 0.5 10*3/uL (ref 0.1–0.9)
Monocytes: 11 %
Neutrophils Absolute: 2 10*3/uL (ref 1.4–7.0)
Neutrophils: 49 %
Platelets: 240 10*3/uL (ref 150–450)
RBC: 4.2 x10E6/uL (ref 3.77–5.28)
RDW: 14.3 % (ref 11.7–15.4)
WBC: 4.1 10*3/uL (ref 3.4–10.8)

## 2022-03-07 MED ORDER — DICLOFENAC SODIUM 1 % EX GEL: 2.0000 g | Freq: Four times a day (QID) | CUTANEOUS | 2 refills | Status: DC

## 2022-03-07 MED ORDER — BUSPIRONE HCL 15 MG PO TABS: 15.0000 mg | ORAL_TABLET | Freq: Three times a day (TID) | ORAL | 3 refills | Status: DC

## 2022-03-07 MED ORDER — TRAZODONE HCL 50 MG PO TABS: 150.0000 mg | ORAL_TABLET | Freq: Every day | ORAL | 5 refills | Status: DC

## 2022-03-07 MED ORDER — PANTOPRAZOLE SODIUM 40 MG PO TBEC: 40.0000 mg | DELAYED_RELEASE_TABLET | Freq: Every day | ORAL | 1 refills | Status: DC

## 2022-03-07 MED ORDER — LISINOPRIL 20 MG PO TABS: 20.0000 mg | ORAL_TABLET | Freq: Every day | ORAL | 3 refills | Status: DC

## 2022-03-07 MED ORDER — MIRABEGRON ER 50 MG PO TB24: 50.0000 mg | ORAL_TABLET | Freq: Every day | ORAL | 3 refills | Status: DC

## 2022-03-07 NOTE — Patient Instructions (Signed)
Osteoarthritis  Osteoarthritis is a type of arthritis. It refers to joint pain or joint disease. Osteoarthritis affects tissue that covers the ends of bones in joints (cartilage). Cartilage acts as a cushion between the bones and helps them move smoothly. Osteoarthritis occurs when cartilage in the joints gets worn down. Osteoarthritis is sometimes called "wear and tear" arthritis. Osteoarthritis is the most common form of arthritis. It often occurs in older people. It is a condition that gets worse over time. The joints most often affected by this condition are in the fingers, toes, hips, knees, and spine, including the neck and lower back. What are the causes? This condition is caused by the wearing down of cartilage that covers the ends of bones. What increases the risk? The following factors may make you more likely to develop this condition: Being age 50 or older. Obesity. Overuse of joints. Past injury of a joint. Past surgery on a joint. Family history of osteoarthritis. What are the signs or symptoms? The main symptoms of this condition are pain, swelling, and stiffness in the joint. Other symptoms may include: An enlarged joint. More pain and further damage caused by small pieces of bone or cartilage that break off and float inside of the joint. Small deposits of bone (osteophytes) that grow on the edges of the joint. A grating or scraping feeling inside the joint when you move it. Popping or creaking sounds when you move. Difficulty walking or exercising. An inability to grip items, twist your hand(s), or control the movements of your hands and fingers. How is this diagnosed? This condition may be diagnosed based on: Your medical history. A physical exam. Your symptoms. X-rays of the affected joint(s). Blood tests to rule out other types of arthritis. How is this treated? There is no cure for this condition, but treatment can help control pain and improve joint function.  Treatment may include a combination of therapies, such as: Pain relief techniques, such as: Applying heat and cold to the joint. Massage. A form of talk therapy called cognitive behavioral therapy (CBT). This therapy helps you set goals and follow up on the changes that you make. Medicines for pain and inflammation. The medicines can be taken by mouth or applied to the skin. They include: NSAIDs, such as ibuprofen. Prescription medicines. Strong anti-inflammatory medicines (corticosteroids). Certain nutritional supplements. A prescribed exercise program. You may work with a physical therapist. Assistive devices, such as a brace, wrap, splint, specialized glove, or cane. A weight control plan. Surgery, such as: An osteotomy. This is done to reposition the bones and relieve pain or to remove loose pieces of bone and cartilage. Joint replacement surgery. You may need this surgery if you have advanced osteoarthritis. Follow these instructions at home: Activity Rest your affected joints as told by your health care provider. Exercise as told by your health care provider. He or she may recommend specific types of exercise, such as: Strengthening exercises. These are done to strengthen the muscles that support joints affected by arthritis. Aerobic activities. These are exercises, such as brisk walking or water aerobics, that increase your heart rate. Range-of-motion activities. These help your joints move more easily. Balance and agility exercises. Managing pain, stiffness, and swelling     If directed, apply heat to the affected area as often as told by your health care provider. Use the heat source that your health care provider recommends, such as a moist heat pack or a heating pad. If you have a removable assistive device, remove it   as told by your health care provider. Place a towel between your skin and the heat source. If your health care provider tells you to keep the assistive device  on while you apply heat, place a towel between the assistive device and the heat source. Leave the heat on for 20-30 minutes. Remove the heat if your skin turns bright red. This is especially important if you are unable to feel pain, heat, or cold. You may have a greater risk of getting burned. If directed, put ice on the affected area. To do this: If you have a removable assistive device, remove it as told by your health care provider. Put ice in a plastic bag. Place a towel between your skin and the bag. If your health care provider tells you to keep the assistive device on during icing, place a towel between the assistive device and the bag. Leave the ice on for 20 minutes, 2-3 times a day. Move your fingers or toes often to reduce stiffness and swelling. Raise (elevate) the injured area above the level of your heart while you are sitting or lying down. General instructions Take over-the-counter and prescription medicines only as told by your health care provider. Maintain a healthy weight. Follow instructions from your health care provider for weight control. Do not use any products that contain nicotine or tobacco, such as cigarettes, e-cigarettes, and chewing tobacco. If you need help quitting, ask your health care provider. Use assistive devices as told by your health care provider. Keep all follow-up visits as told by your health care provider. This is important. Where to find more information National Institute of Arthritis and Musculoskeletal and Skin Diseases: www.niams.nih.gov National Institute on Aging: www.nia.nih.gov American College of Rheumatology: www.rheumatology.org Contact a health care provider if: You have redness, swelling, or a feeling of warmth in a joint that gets worse. You have a fever along with joint or muscle aches. You develop a rash. You have trouble doing your normal activities. Get help right away if: You have pain that gets worse and is not relieved by  pain medicine. Summary Osteoarthritis is a type of arthritis that affects tissue covering the ends of bones in joints (cartilage). This condition is caused by the wearing down of cartilage that covers the ends of bones. The main symptom of this condition is pain, swelling, and stiffness in the joint. There is no cure for this condition, but treatment can help control pain and improve joint function. This information is not intended to replace advice given to you by your health care provider. Make sure you discuss any questions you have with your health care provider. Document Revised: 05/30/2019 Document Reviewed: 05/30/2019 Elsevier Patient Education  2023 [employer]  

## 2022-03-07 NOTE — Progress Notes (Signed)
Subjective:    Patient ID: Tracy Jensen, female    DOB: [birth year], 66 y.o.   MRN: [id number]  Chief Complaint  Patient presents with   Medical Management of Chronic Issues    Multiple concerns today and flu shot today. Fosamax not helping pain in both hips stopped diclofenac wants to try med with out nsaids. Found rub at [place] that's new well relive    Pt presents to the office today for chronic follow up.   She has her grandson every other week.  Hypertension This is a chronic problem. The current episode started more than 1 year ago. The problem has been resolved since onset. The problem is controlled. Associated symptoms include anxiety and malaise/fatigue. Pertinent negatives include no peripheral edema or shortness of breath. Risk factors for coronary artery disease include dyslipidemia, obesity and sedentary lifestyle. The current treatment provides moderate improvement. There is no history of heart failure.  Back Pain This is a chronic problem. The current episode started more than 1 year ago. The problem occurs intermittently. The problem has been waxing and waning since onset. The pain is present in the lumbar spine. The quality of the pain is described as aching. The pain is mild. The treatment provided mild relief.  Arthritis Presents for follow-up visit. She complains of pain and stiffness. The symptoms have been stable. Affected locations include the left hip and right hip. Her pain is at a severity of 7/10. Associated symptoms include fatigue.  Anemia Presents for follow-up visit. Symptoms include malaise/fatigue. There has been no anorexia. There is no history of heart failure.  Insomnia Primary symptoms: difficulty falling asleep, frequent awakening, malaise/fatigue.   The current episode started more than one year. The onset quality is gradual. The problem occurs intermittently. The symptoms are relieved by medication. PMH includes: depression.   Anxiety Presents for  follow-up visit. Symptoms include depressed mood, excessive worry, insomnia, irritability and nervous/anxious behavior. Patient reports no shortness of breath. Symptoms occur occasionally. The severity of symptoms is moderate.   Her past medical history is significant for anemia.  Depression        This is a chronic problem.  The current episode started more than 1 year ago.   The onset quality is gradual.   The problem occurs intermittently.  Associated symptoms include fatigue, insomnia, decreased interest and sad.  Associated symptoms include no helplessness and no hopelessness.  Past medical history includes anxiety.   Constipation This is a chronic problem. The current episode started more than 1 year ago. Her stool frequency is 1 time per day. Associated symptoms include back pain. Pertinent negatives include no anorexia. She has tried laxatives for the symptoms.  Urinary Frequency  This is a chronic problem. The current episode started more than 1 year ago. The problem occurs intermittently. The problem has been waxing and waning. The patient is experiencing no pain. Associated symptoms include frequency.      Review of Systems  Constitutional:  Positive for fatigue, irritability and malaise/fatigue.  Respiratory:  Negative for shortness of breath.   Gastrointestinal:  Positive for constipation. Negative for anorexia.  Genitourinary:  Positive for frequency.  Musculoskeletal:  Positive for arthritis, back pain and stiffness.  Psychiatric/Behavioral:  Positive for depression. The patient is nervous/anxious and has insomnia.   All other systems reviewed and are negative.      Objective:   Physical Exam Vitals reviewed.  Constitutional:      General: She is not in acute distress.  Appearance: She is well-developed.  HENT:     Head: Normocephalic and atraumatic.     Right Ear: Tympanic membrane normal.     Left Ear: Tympanic membrane normal.  Eyes:     Pupils: Pupils are  equal, round, and reactive to light.  Neck:     Thyroid: No thyromegaly.  Cardiovascular:     Rate and Rhythm: Normal rate and regular rhythm.     Heart sounds: Normal heart sounds. No murmur heard. Pulmonary:     Effort: Pulmonary effort is normal. No respiratory distress.     Breath sounds: Normal breath sounds. No wheezing.  Abdominal:     General: Bowel sounds are normal. There is no distension.     Palpations: Abdomen is soft.     Tenderness: There is no abdominal tenderness.  Musculoskeletal:        General: No tenderness. Normal range of motion.     Cervical back: Normal range of motion and neck supple.  Skin:    General: Skin is warm and dry.  Neurological:     Mental Status: She is alert and oriented to person, place, and time.     Cranial Nerves: No cranial nerve deficit.     Deep Tendon Reflexes: Reflexes are normal and symmetric.  Psychiatric:        Behavior: Behavior normal.        Thought Content: Thought content normal.        Judgment: Judgment normal.       BP 129/84   Pulse 73   Temp 97.6 F (36.4 C) (Temporal)   Ht 4' 10"  (1.473 m)   Wt 113 lb 12.8 oz (51.6 kg)   BMI 23.78 kg/m      Assessment & Plan:  Tracy Jensen comes in today with chief complaint of Medical Management of Chronic Issues (Multiple concerns today and flu shot today. Fosamax not helping pain in both hips stopped diclofenac wants to try med with out nsaids. Found rub at [place] that's new well relive )   Diagnosis and orders addressed:  1. GAD (generalized anxiety disorder) - busPIRone (BUSPAR) 15 MG tablet; Take 1 tablet (15 mg total) by mouth 3 (three) times daily.  Dispense: 270 tablet; Refill: 3 - CMP14+EGFR - CBC with Differential/Platelet  2. HTN (hypertension), benign - lisinopril (ZESTRIL) 20 MG tablet; Take 1 tablet (20 mg total) by mouth daily. (NEEDS TO BE SEEN BEFORE NEXT REFILL)  Dispense: 90 tablet; Refill: 3 - CMP14+EGFR - CBC with Differential/Platelet  3.  Insomnia, unspecified type - traZODone (DESYREL) 50 MG tablet; Take 3 tablets (150 mg total) by mouth at bedtime.  Dispense: 90 tablet; Refill: 5 - CMP14+EGFR - CBC with Differential/Platelet  4. Multiple gastric ulcers - pantoprazole (PROTONIX) 40 MG tablet; Take 1 tablet (40 mg total) by mouth daily.  Dispense: 90 tablet; Refill: 1 - CMP14+EGFR - CBC with Differential/Platelet  5. OAB (overactive bladder) - mirabegron ER (MYRBETRIQ) 50 MG TB24 tablet; Take 1 tablet (50 mg total) by mouth daily.  Dispense: 90 tablet; Refill: 3 - CMP14+EGFR - CBC with Differential/Platelet  6. Chronic right-sided low back pain with right-sided sciatica - CMP14+EGFR - CBC with Differential/Platelet - diclofenac Sodium (VOLTAREN) 1 % GEL; Apply 2 g topically 4 (four) times daily.  Dispense: 350 g; Refill: 2  7. Primary osteoarthritis involving multiple joints - CMP14+EGFR - CBC with Differential/Platelet - diclofenac Sodium (VOLTAREN) 1 % GEL; Apply 2 g topically 4 (four) times daily.  Dispense: 350  g; Refill: 2  8. Current mild episode of major depressive disorder without prior episode (HCC) - CMP14+EGFR - CBC with Differential/Platelet  9. History of CVA (cerebrovascular accident) - CMP14+EGFR - CBC with Differential/Platelet  10. Iron deficiency anemia due to chronic blood loss - CMP14+EGFR - CBC with Differential/Platelet  11. Need for immunization against influenza - Flu Vaccine QUAD High Dose(Fluad) - CMP14+EGFR - CBC with Differential/Platelet   Labs pending Health Maintenance reviewed Diet and exercise encouraged  Follow up plan: 6 months    Evelina Dun, FNP

## 2022-03-10 ENCOUNTER — Other Ambulatory Visit: Payer: Self-pay | Admitting: *Deleted

## 2022-03-10 MED ORDER — ALENDRONATE SODIUM 70 MG PO TABS: 70.0000 mg | ORAL_TABLET | ORAL | 1 refills | Status: DC

## 2022-03-22 ENCOUNTER — Other Ambulatory Visit: Payer: Self-pay | Admitting: Family

## 2022-03-24 NOTE — Telephone Encounter (Signed)
Last OV 03/07/22. Last RF 09/03/21 # 90 3 RF. Next OV 09/04/22

## 2022-03-31 DIAGNOSIS — M545 Low back pain, unspecified: Secondary | ICD-10-CM | POA: Diagnosis not present

## 2022-03-31 DIAGNOSIS — M25552 Pain in left hip: Secondary | ICD-10-CM | POA: Diagnosis not present

## 2022-05-15 IMAGING — MG MM DIGITAL SCREENING BILAT W/ TOMO AND CAD
8 series · 9 of 24 positions shown · non-contrast
Comparison: None.

CLINICAL DATA: Screening.

EXAM:
DIGITAL SCREENING BILATERAL MAMMOGRAM WITH TOMOSYNTHESIS AND CAD
TECHNIQUE: Bilateral screening digital craniocaudal and mediolateral oblique
mammograms were obtained. Bilateral screening digital breast
tomosynthesis was performed. The images were evaluated with
computer-aided detection.

[R CC synth-2D]
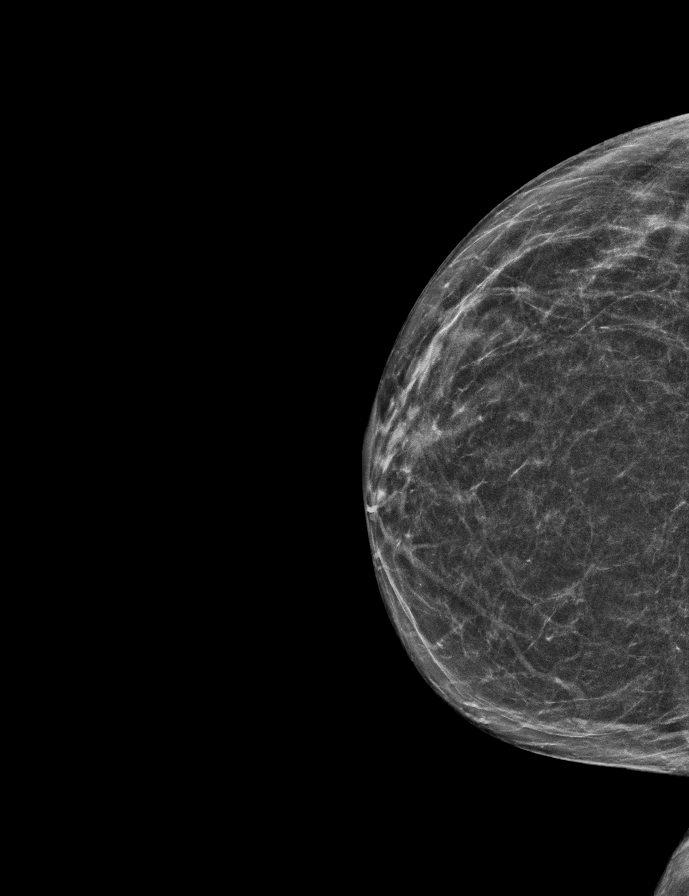

[L CC synth-2D]
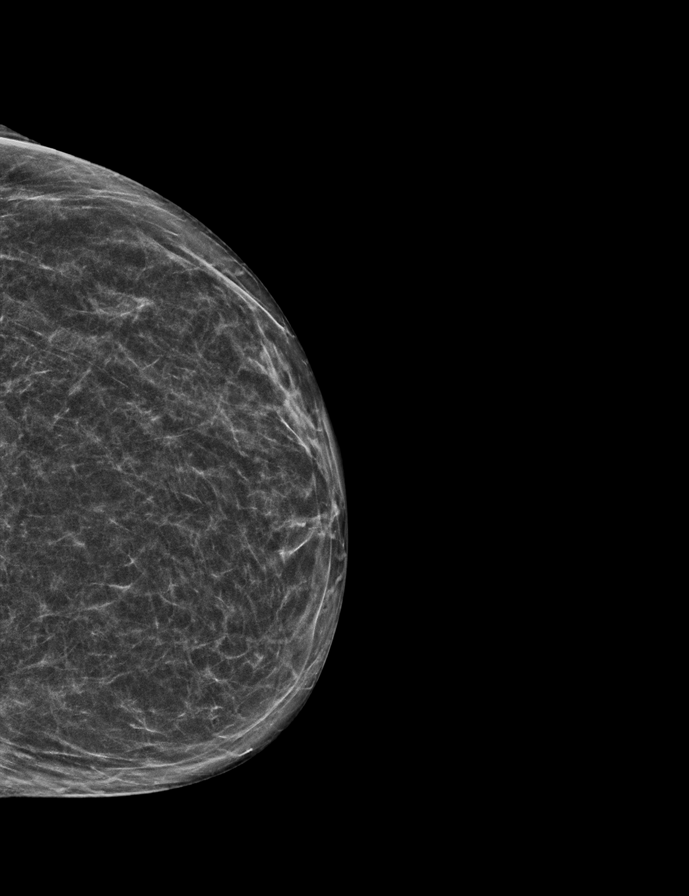

[L MLO synth-2D]
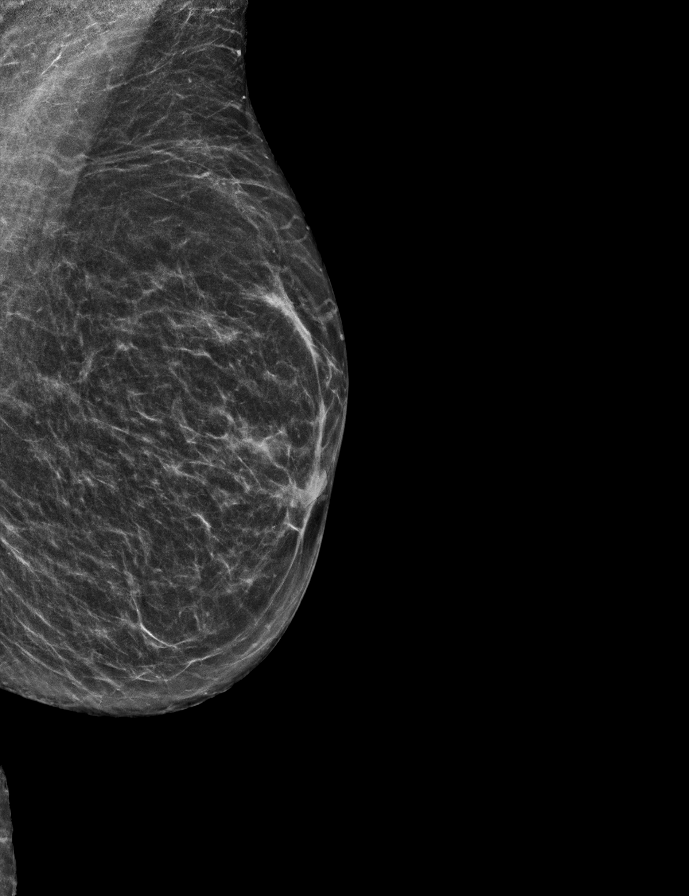

[R MLO synth-2D]
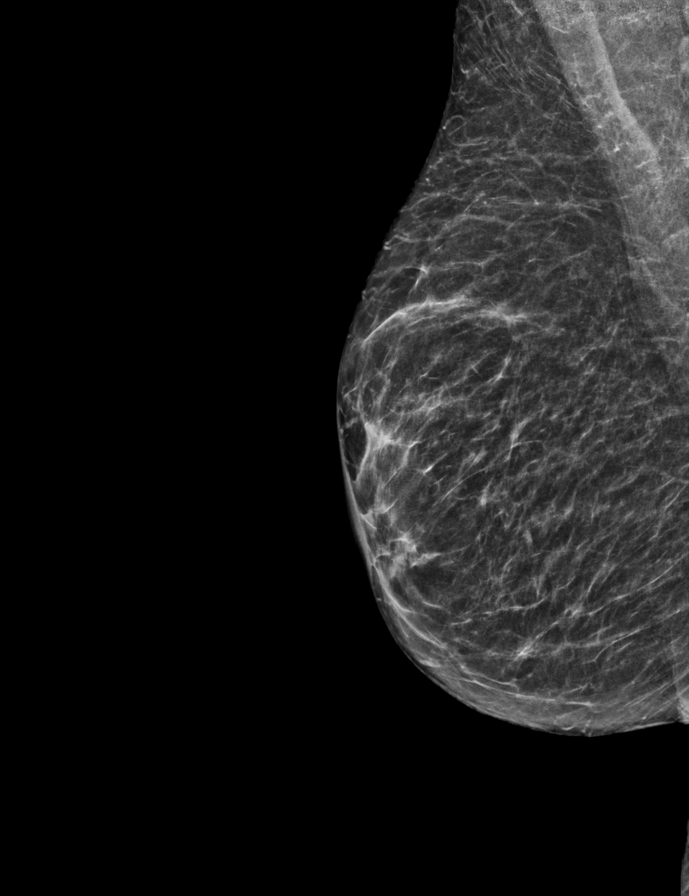

[L CC tomo · 2 of 50 frames shown]
[frame 17/50]
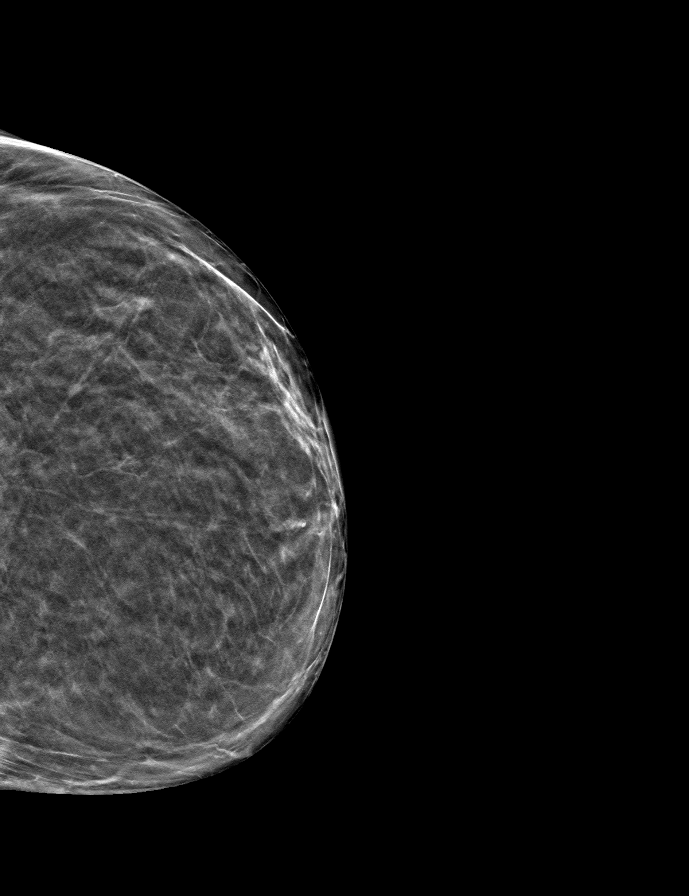
[frame 25/50]
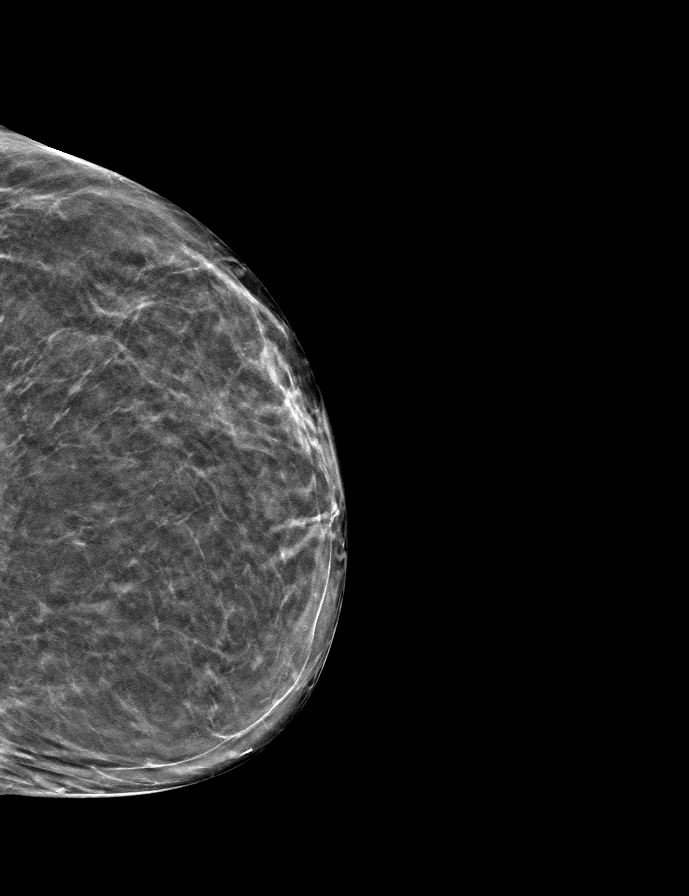

[L MLO tomo · tomo slice 27/52.0]
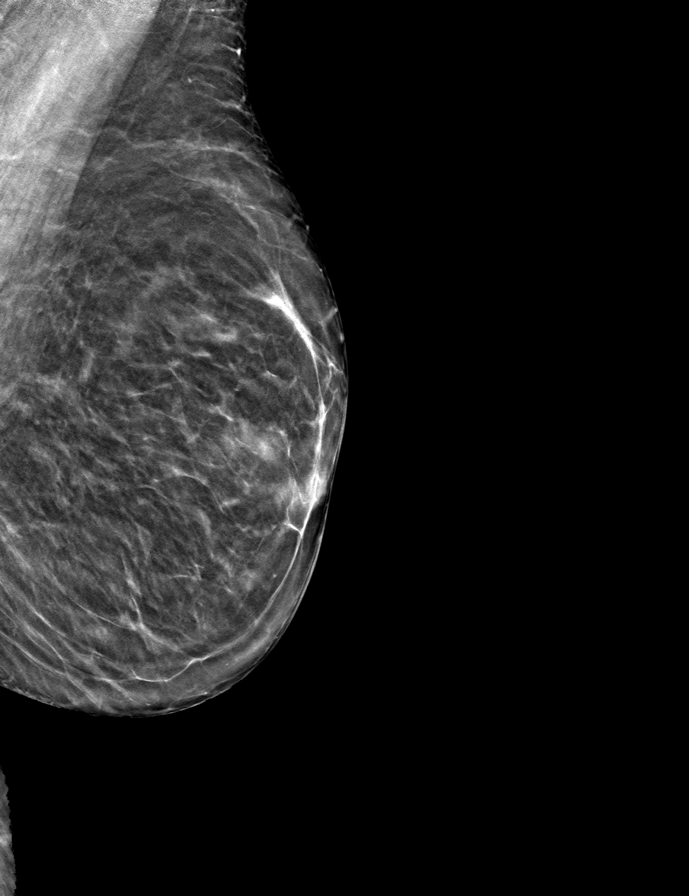

[R MLO tomo · tomo slice 27/53.0]
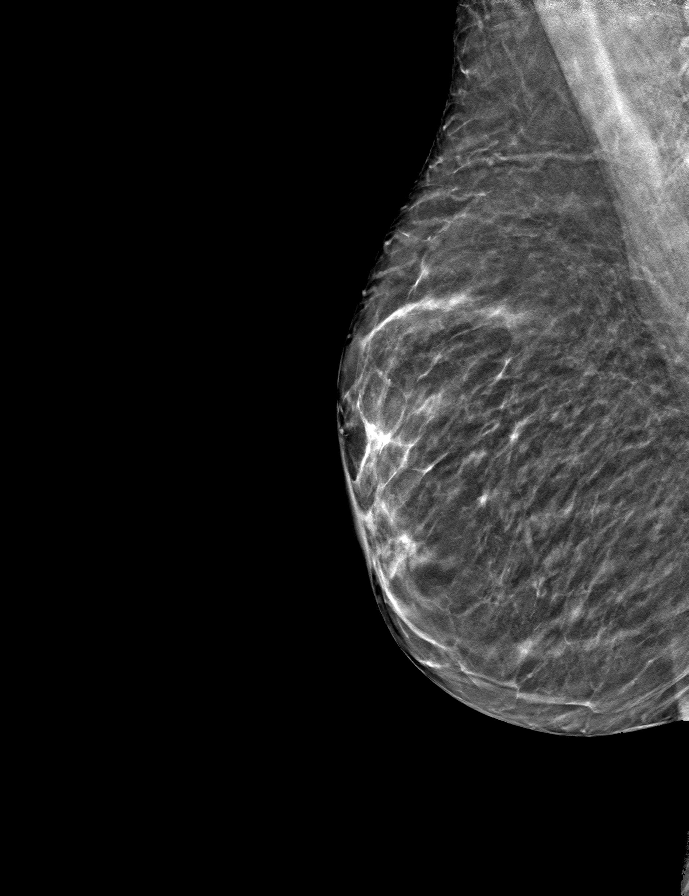

[R CC tomo · tomo slice 25/50.0]
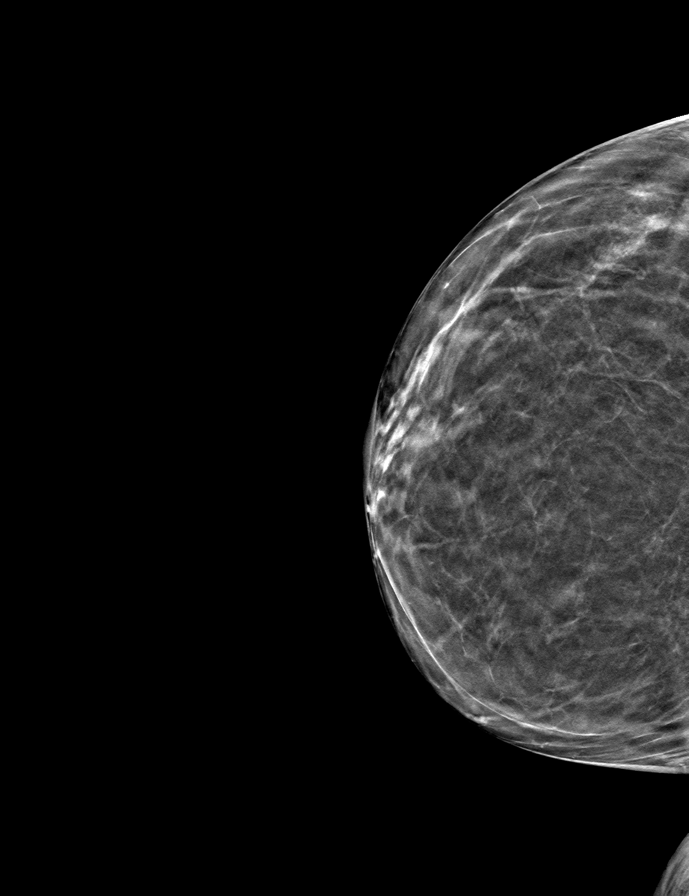

[9 of 24 positions shown; findings below may reference images not displayed]

ACR Breast Density Category b: There are scattered areas of
fibroglandular density.
FINDINGS: There are no findings suspicious for malignancy.
IMPRESSION: No mammographic evidence of malignancy. A result letter of this
screening mammogram will be mailed directly to the patient.

RECOMMENDATION:
Screening mammogram in one year. (Code:XG-X-X7B)

BI-RADS CATEGORY  1: Negative.

## 2022-05-20 ENCOUNTER — Telehealth: Payer: Self-pay | Admitting: Family

## 2022-05-20 ENCOUNTER — Ambulatory Visit (INDEPENDENT_AMBULATORY_CARE_PROVIDER_SITE_OTHER): Payer: Medicare Other

## 2022-05-20 VITALS — Ht <= 58 in | Wt 110.0 lb

## 2022-05-20 DIAGNOSIS — Z0000 Encounter for general adult medical examination without abnormal findings: Secondary | ICD-10-CM | POA: Diagnosis not present

## 2022-05-20 NOTE — Progress Notes (Signed)
Subjective:   Tracy Jensen is a 66 y.o. female who presents for Medicare Annual (Subsequent) preventive examination. I connected with  [name] on 05/20/22 by a audio enabled telemedicine application and verified that I am speaking with the correct person using two identifiers.  Patient Location: Home  Provider Location: Home Office  I discussed the limitations of evaluation and management by telemedicine. The patient expressed understanding and agreed to proceed.  Review of Systems     Cardiac Risk Factors include: advanced age (>64mn, >>11women)     Objective:    Today's Vitals   05/20/22 1010  Weight: 110 lb (49.9 kg)  Height: _0  (1.448 m)   Body mass index is 23.8 kg/m.     05/20/2022   10:14 AM 05/06/2021    4:30 PM 11/29/2018    6:49 PM 08/16/2018    9:13 AM 07/11/2018    1:00 AM 07/10/2018    1:21 PM 01/01/2018    8:32 AM  Advanced Directives  Does Patient Have a Medical Advance Directive? _1  No No  Would patient like information on creating a medical advance directive? No - Patient declined No - Patient declined   Yes (Inpatient - patient defers creating a medical advance directive at this time) No - Patient declined     Current Medications (verified) Outpatient Encounter Medications as of 05/20/2022  Medication Sig   alendronate (FOSAMAX) 70 MG tablet Take 1 tablet (70 mg total) by mouth every 7 (seven) days. Take with a full glass of water on an empty stomach.   aspirin EC 81 MG tablet Take 81 mg by mouth at bedtime. Swallow whole.   busPIRone (BUSPAR) 15 MG tablet Take 1 tablet (15 mg total) by mouth 3 (three) times daily.   butalbital-acetaminophen-caffeine (FIORICET) 50-325-40 MG tablet TAKE 1 TABLET BY MOUTH EVERY 6 HOURS AS NEEDED FOR HEADACHE   Calcium Carb-Cholecalciferol (CALCIUM 1000 + D PO) Take by mouth.   cholecalciferol (VITAMIN D3) 25 MCG (1000 UNIT) tablet Take 1,000 Units by mouth daily.   Cyanocobalamin (VITAMIN B-12 PO) Take  by mouth.   cyclobenzaprine (FLEXERIL) 10 MG tablet TAKE 1 TABLET BY MOUTH THREE TIMES DAILY   diclofenac Sodium (VOLTAREN) 1 % GEL Apply 2 g topically 4 (four) times daily.   fluticasone (FLONASE) 50 MCG/ACT nasal spray Place 2 sprays into both nostrils daily.   Iron, Ferrous Sulfate, 325 (65 Fe) MG TABS Take 325 mg by mouth daily.   lisinopril (ZESTRIL) 20 MG tablet Take 1 tablet (20 mg total) by mouth daily. (NEEDS TO BE SEEN BEFORE NEXT REFILL)   mirabegron ER (MYRBETRIQ) 50 MG TB24 tablet Take 1 tablet (50 mg total) by mouth daily.   pantoprazole (PROTONIX) 40 MG tablet Take 1 tablet (40 mg total) by mouth daily.   polyethylene glycol (MIRALAX / GLYCOLAX) packet Take 17 g by mouth daily as needed for mild constipation or moderate constipation.   traZODone (DESYREL) 50 MG tablet Take 3 tablets (150 mg total) by mouth at bedtime.   No facility-administered encounter medications on file as of 05/20/2022.    Allergies (verified) Azithromycin, Statins, and Omeprazole   History: Past Medical History:  Diagnosis Date   Anemia    Anxiety    Arthritis    Esophageal stricture    Gastric ulcer    GI bleed    Hiatal hernia    Hypertension    Insomnia    Migraines    Sciatica  TIA (transient ischemic attack)    Past Surgical History:  Procedure Laterality Date   CAROTID ARTERY ANGIOPLASTY  09/2000   CATARACT EXTRACTION, BILATERAL Bilateral    CESAREAN SECTION     1991, 1984   COLONOSCOPY     ROTATOR CUFF REPAIR Right 2020   UPPER GASTROINTESTINAL ENDOSCOPY  11/01/2021   Dr.Beavers   Family History  Problem Relation Age of Onset   Diabetes Mother    Asthma Mother    Hypertension Mother    Hypertension Father    Heart disease Father    Colon cancer Neg Hx    Esophageal cancer Neg Hx    Stomach cancer Neg Hx    Social History   Socioeconomic History   Marital status: Married    Spouse name: Not on file   Number of children: 2   Years of education: Not on file    Highest education level: Not on file  Occupational History   Occupation: retired  Tobacco Use   Smoking status: Never   Smokeless tobacco: Never  Vaping Use   Vaping Use: Never used  Substance and Sexual Activity   Alcohol use: Yes    Comment: occ   Drug use: No   Sexual activity: Not on file  Other Topics Concern   Not on file  Social History Narrative   Not on file   Social Determinants of Health   Financial Resource Strain: Low Risk  (05/20/2022)   Overall Financial Resource Strain (CARDIA)    Difficulty of Paying Living Expenses: Not hard at all  Food Insecurity: No Food Insecurity (05/20/2022)   Hunger Vital Sign    Worried About Running Out of Food in the Last Year: Never true    Ran Out of Food in the Last Year: Never true  Transportation Needs: No Transportation Needs (05/20/2022)   PRAPARE - Hydrologist (Medical): No    Lack of Transportation (Non-Medical): No  Physical Activity: Insufficiently Active (05/20/2022)   Exercise Vital Sign    Days of Exercise per Week: 3 days    Minutes of Exercise per Session: 30 min  Stress: No Stress Concern Present (05/20/2022)   [place]    Feeling of Stress : Not at all  Social Connections: Moderately Integrated (05/20/2022)   Social Connection and Isolation Panel [NHANES]    Frequency of Communication with Friends and Family: More than three times a week    Frequency of Social Gatherings with Friends and Family: More than three times a week    Attends Religious Services: More than 4 times per year    Active Member of [employer] or Organizations: No    Attends Music therapist: Never    Marital Status: Married    Tobacco Counseling Counseling given: Not Answered   Clinical Intake:  Pre-visit preparation completed: Yes  Pain : No/denies pain     Nutritional Risks: None Diabetes: No  How often do you need to have  someone help you when you read instructions, pamphlets, or other written materials from your doctor or pharmacy?: 1 - Never  Diabetic?no   Interpreter Needed?: No  Information entered by :: Jadene Pierini, LPN   Activities of Daily Living    05/20/2022   10:14 AM  In your present state of health, do you have any difficulty performing the following activities:  Hearing? 0  Vision? 0  Difficulty concentrating or making decisions? 0  Walking or climbing stairs? 0  Dressing or bathing? 0  Doing errands, shopping? 0  Preparing Food and eating ? N  Using the Toilet? N  In the past six months, have you accidently leaked urine? N  Do you have problems with loss of bowel control? N  Managing your Medications? N  Managing your Finances? N  Housekeeping or managing your Housekeeping? N    Patient Care Team: Sharion Balloon, FNP as PCP - General (Family Medicine) Harlen Labs, MD as Referring Physician (Optometry) Garald Balding, MD as Consulting Physician (Orthopedic Surgery)  Indicate any recent Medical Services you may have received from other than [facility] providers in the past year (date may be approximate).     Assessment:   This is a routine wellness examination for [employer].  Hearing/Vision screen Vision Screening - Comments:: Wears rx glasses - up to date with routine eye exams with  Dr.lee  Dietary issues and exercise activities discussed: Current Exercise Habits: Home exercise routine, Type of exercise: walking, Time (Minutes): 30, Frequency (Times/Week): 3, Weekly Exercise (Minutes/Week): 90, Intensity: Mild, Exercise limited by: orthopedic condition(s)   Goals Addressed             This Visit's Progress    Exercise 150 min/wk Moderate Activity   On track      Depression Screen    10/17/2021    8:19 AM 10/11/2021    9:36 AM 09/03/2021   10:08 AM 05/06/2021    4:20 PM 03/06/2021    9:40 AM 12/12/2020    9:53 AM 12/12/2020    9:44 AM  PHQ 2/9 Scores  PHQ - 2  Score 0 0 0 0 0 0 0  PHQ- 9 Score 1 0 0  0 0 0    Fall Risk    05/20/2022   10:11 AM 03/07/2022    8:18 AM 10/17/2021    8:19 AM 10/11/2021    9:36 AM 05/06/2021    4:29 PM  Fall Risk   Falls in the past year? 0 0 0 0 0  Number falls in past yr: 0    0  Injury with Fall? 0    0  Risk for fall due to : No Fall Risks    Orthopedic patient  Follow up Falls prevention discussed  Falls evaluation completed  Falls prevention discussed    FALL RISK PREVENTION PERTAINING TO THE HOME:  Any stairs in or around the home? No  If so, are there any without handrails? No  Home free of loose throw rugs in walkways, pet beds, electrical cords, etc? Yes  Adequate lighting in your home to reduce risk of falls? Yes   ASSISTIVE DEVICES UTILIZED TO PREVENT FALLS:  Life alert? No  Use of a cane, walker or w/c? No  Grab bars in the bathroom? Yes  Shower chair or bench in shower? Yes  Elevated toilet seat or a handicapped toilet? Yes          05/20/2022   10:15 AM 05/06/2021    4:29 PM  6CIT Screen  What Year? 0 points 0 points  What month? 0 points 0 points  What time? 0 points 0 points  Count back from 20 0 points 0 points  Months in reverse 0 points 0 points  Repeat phrase 0 points 0 points  Total Score 0 points 0 points    Immunizations Immunization History  Administered Date(s) Administered   Fluad Quad(high Dose 65+) 03/06/2021, 03/07/2022  Hepatitis B, adult 03/27/2014, 11/30/2014, 03/20/2015   Influenza Split 01/26/2013   Influenza, High Dose Seasonal PF 03/08/2018   Influenza,inj,Quad PF,6+ Mos 02/17/2019   Influenza-Unspecified 02/11/2017, 02/17/2019   MMR 10/30/2017   Moderna Sars-Covid-2 Vaccination 09/05/2019, 10/03/2019   Pneumococcal Conjugate-13 03/20/2020   Pneumococcal Polysaccharide-23 05/14/2021   Tdap 03/14/2013   Zoster Recombinat (Shingrix) 09/18/2016, 12/30/2016    TDAP status: Up to date  Flu Vaccine status: Up to date  Pneumococcal vaccine status: Up  to date  Covid-19 vaccine status: Completed vaccines  Qualifies for Shingles Vaccine? Yes   Zostavax completed Yes   Shingrix Completed?: Yes  Screening Tests Health Maintenance  Topic Date Due   COVID-19 Vaccine (3 - 2023-24 season) 02/14/2022   MAMMOGRAM  07/15/2022   DEXA SCAN  03/07/2023   DTaP/Tdap/Td (2 - Td or Tdap) 03/15/2023   Medicare Annual Wellness (AWV)  05/21/2023   COLONOSCOPY (Pts 45-83yr Insurance coverage will need to be confirmed)  12/21/2023   Pneumonia Vaccine 67 Years old  Completed   INFLUENZA VACCINE  Completed   Hepatitis C Screening  Completed   Zoster Vaccines- Shingrix  Completed   HPV VACCINES  Aged Out    Health Maintenance  Health Maintenance Due  Topic Date Due   COVID-19 Vaccine (3 - 2023-24 season) 02/14/2022    Colorectal cancer screening: Type of screening: Colonoscopy. Completed 12/20/2013. Repeat every 10 years  Mammogram status: Completed 07/15/2021. Repeat every year  Bone Density status: Completed 03/06/2021. Results reflect: Bone density results: OSTEOPOROSIS. Repeat every 2 years.  Lung Cancer Screening: (Low Dose CT Chest recommended if Age 67-80years, 30 pack-year currently smoking OR have quit w/in 15years.) does not qualify.   Lung Cancer Screening Referral: n/a  Additional Screening:  Hepatitis C Screening: does not qualify; Completed 10/03/2019  Vision Screening: Recommended annual ophthalmology exams for early detection of glaucoma and other disorders of the eye. Is the patient up to date with their annual eye exam?  Yes  Who is the provider or what is the name of the office in which the patient attends annual eye exams? Dr.Lee  If pt is not established with a provider, would they like to be referred to a provider to establish care? No .   Dental Screening: Recommended annual dental exams for proper oral hygiene  Community Resource Referral / Chronic Care Management: CRR required this visit?  No   CCM required  this visit?  No      Plan:     I have personally reviewed and noted the following in the patient's chart:   Medical and social history Use of alcohol, tobacco or illicit drugs  Current medications and supplements including opioid prescriptions. Patient is not currently taking opioid prescriptions. Functional ability and status Nutritional status Physical activity Advanced directives List of other physicians Hospitalizations, surgeries, and ER visits in previous 12 months Vitals Screenings to include cognitive, depression, and falls Referrals and appointments  In addition, I have reviewed and discussed with patient certain preventive protocols, quality metrics, and best practice recommendations. A written personalized care plan for preventive services as well as general preventive health recommendations were provided to patient.     LDaphane Shepherd LPN   [id number]  Nurse Notes: none

## 2022-05-20 NOTE — Telephone Encounter (Signed)
Up front patient aware 

## 2022-05-20 NOTE — Patient Instructions (Signed)
Ms. Tracy Jensen , Thank you for taking time to come for your Medicare Wellness Visit. I appreciate your ongoing commitment to your health goals. Please review the following plan we discussed and let me know if I can assist you in the future.   These are the goals we discussed:  Goals      Exercise 150 min/wk Moderate Activity        This is a list of the screening recommended for you and due dates:  Health Maintenance  Topic Date Due   COVID-19 Vaccine (3 - 2023-24 season) 02/14/2022   Mammogram  07/15/2022   DEXA scan (bone density measurement)  03/07/2023   DTaP/Tdap/Td vaccine (2 - Td or Tdap) 03/15/2023   Medicare Annual Wellness Visit  05/21/2023   Colon Cancer Screening  12/21/2023   Pneumonia Vaccine  Completed   Flu Shot  Completed   Hepatitis C Screening: USPSTF Recommendation to screen - Ages 18-79 yo.  Completed   Zoster (Shingles) Vaccine  Completed   HPV Vaccine  Aged Out    Advanced directives: Advance directive discussed with you today. I have provided a copy for you to complete at home and have notarized. Once this is complete please bring a copy in to our office so we can scan it into your chart.   Conditions/risks identified: Aim for 30 minutes of exercise or brisk walking, 6-8 glasses of water, and 5 servings of fruits and vegetables each day.   Next appointment: Follow up in one year for your annual wellness visit    Preventive Care 65 Years and Older, Female Preventive care refers to lifestyle choices and visits with your health care provider that can promote health and wellness. What does preventive care include? A yearly physical exam. This is also called an annual well check. Dental exams once or twice a year. Routine eye exams. Ask your health care provider how often you should have your eyes checked. Personal lifestyle choices, including: Daily care of your teeth and gums. Regular physical activity. Eating a healthy diet. Avoiding tobacco and drug  use. Limiting alcohol use. Practicing safe sex. Taking low-dose aspirin every day. Taking vitamin and mineral supplements as recommended by your health care provider. What happens during an annual well check? The services and screenings done by your health care provider during your annual well check will depend on your age, overall health, lifestyle risk factors, and family history of disease. Counseling  Your health care provider may ask you questions about your: Alcohol use. Tobacco use. Drug use. Emotional well-being. Home and relationship well-being. Sexual activity. Eating habits. History of falls. Memory and ability to understand (cognition). Work and work Astronomer. Reproductive health. Screening  You may have the following tests or measurements: Height, weight, and BMI. Blood pressure. Lipid and cholesterol levels. These may be checked every 5 years, or more frequently if you are over 38 years old. Skin check. Lung cancer screening. You may have this screening every year starting at age 72 if you have a 30-pack-year history of smoking and currently smoke or have quit within the past 15 years. Fecal occult blood test (FOBT) of the stool. You may have this test every year starting at age 64. Flexible sigmoidoscopy or colonoscopy. You may have a sigmoidoscopy every 5 years or a colonoscopy every 10 years starting at age 40. Hepatitis C blood test. Hepatitis B blood test. Sexually transmitted disease (STD) testing. Diabetes screening. This is done by checking your blood sugar (glucose) after you have  not eaten for a while (fasting). You may have this done every 1-3 years. Bone density scan. This is done to screen for osteoporosis. You may have this done starting at age 68. Mammogram. This may be done every 1-2 years. Talk to your health care provider about how often you should have regular mammograms. Talk with your health care provider about your test results, treatment  options, and if necessary, the need for more tests. Vaccines  Your health care provider may recommend certain vaccines, such as: Influenza vaccine. This is recommended every year. Tetanus, diphtheria, and acellular pertussis (Tdap, Td) vaccine. You may need a Td booster every 10 years. Zoster vaccine. You may need this after age 75. Pneumococcal 13-valent conjugate (PCV13) vaccine. One dose is recommended after age 44. Pneumococcal polysaccharide (PPSV23) vaccine. One dose is recommended after age 40. Talk to your health care provider about which screenings and vaccines you need and how often you need them. This information is not intended to replace advice given to you by your health care provider. Make sure you discuss any questions you have with your health care provider. Document Released: 06/29/2015 Document Revised: 02/20/2016 Document Reviewed: 04/03/2015 Elsevier Interactive Patient Education  2017 [employer] Prevention in the Home Falls can cause injuries. They can happen to people of all ages. There are many things you can do to make your home safe and to help prevent falls. What can I do on the outside of my home? Regularly fix the edges of walkways and driveways and fix any cracks. Remove anything that might make you trip as you walk through a door, such as a raised step or threshold. Trim any bushes or trees on the path to your home. Use bright outdoor lighting. Clear any walking paths of anything that might make someone trip, such as rocks or tools. Regularly check to see if handrails are loose or broken. Make sure that both sides of any steps have handrails. Any raised decks and porches should have guardrails on the edges. Have any leaves, snow, or ice cleared regularly. Use sand or salt on walking paths during winter. Clean up any spills in your garage right away. This includes oil or grease spills. What can I do in the bathroom? Use night lights. Install grab  bars by the toilet and in the tub and shower. Do not use towel bars as grab bars. Use non-skid mats or decals in the tub or shower. If you need to sit down in the shower, use a plastic, non-slip stool. Keep the floor dry. Clean up any water that spills on the floor as soon as it happens. Remove soap buildup in the tub or shower regularly. Attach bath mats securely with double-sided non-slip rug tape. Do not have throw rugs and other things on the floor that can make you trip. What can I do in the bedroom? Use night lights. Make sure that you have a light by your bed that is easy to reach. Do not use any sheets or blankets that are too big for your bed. They should not hang down onto the floor. Have a firm chair that has side arms. You can use this for support while you get dressed. Do not have throw rugs and other things on the floor that can make you trip. What can I do in the kitchen? Clean up any spills right away. Avoid walking on wet floors. Keep items that you use a lot in easy-to-reach places. If you need to  reach something above you, use a strong step stool that has a grab bar. Keep electrical cords out of the way. Do not use floor polish or wax that makes floors slippery. If you must use wax, use non-skid floor wax. Do not have throw rugs and other things on the floor that can make you trip. What can I do with my stairs? Do not leave any items on the stairs. Make sure that there are handrails on both sides of the stairs and use them. Fix handrails that are broken or loose. Make sure that handrails are as long as the stairways. Check any carpeting to make sure that it is firmly attached to the stairs. Fix any carpet that is loose or worn. Avoid having throw rugs at the top or bottom of the stairs. If you do have throw rugs, attach them to the floor with carpet tape. Make sure that you have a light switch at the top of the stairs and the bottom of the stairs. If you do not have them,  ask someone to add them for you. What else can I do to help prevent falls? Wear shoes that: Do not have high heels. Have rubber bottoms. Are comfortable and fit you well. Are closed at the toe. Do not wear sandals. If you use a stepladder: Make sure that it is fully opened. Do not climb a closed stepladder. Make sure that both sides of the stepladder are locked into place. Ask someone to hold it for you, if possible. Clearly mark and make sure that you can see: Any grab bars or handrails. First and last steps. Where the edge of each step is. Use tools that help you move around (mobility aids) if they are needed. These include: Canes. Walkers. Scooters. Crutches. Turn on the lights when you go into a dark area. Replace any light bulbs as soon as they burn out. Set up your furniture so you have a clear path. Avoid moving your furniture around. If any of your floors are uneven, fix them. If there are any pets around you, be aware of where they are. Review your medicines with your doctor. Some medicines can make you feel dizzy. This can increase your chance of falling. Ask your doctor what other things that you can do to help prevent falls. This information is not intended to replace advice given to you by your health care provider. Make sure you discuss any questions you have with your health care provider. Document Released: 03/29/2009 Document Revised: 11/08/2015 Document Reviewed: 07/07/2014 Elsevier Interactive Patient Education  2017 [employer].

## 2022-06-13 ENCOUNTER — Other Ambulatory Visit: Payer: Self-pay | Admitting: Family

## 2022-06-13 DIAGNOSIS — Z1231 Encounter for screening mammogram for malignant neoplasm of breast: Secondary | ICD-10-CM

## 2022-07-21 ENCOUNTER — Ambulatory Visit
Admission: RE | Admit: 2022-07-21 | Discharge: 2022-07-21 | Disposition: A | Discharge status: Home / Self Care | Payer: Medicare Other | Source: Ambulatory Visit | Attending: Family | Admitting: Family

## 2022-07-21 DIAGNOSIS — Z1231 Encounter for screening mammogram for malignant neoplasm of breast: Secondary | ICD-10-CM | POA: Diagnosis not present

## 2022-08-01 ENCOUNTER — Other Ambulatory Visit: Payer: Self-pay | Admitting: Family

## 2022-08-22 DIAGNOSIS — M25552 Pain in left hip: Secondary | ICD-10-CM | POA: Diagnosis not present

## 2022-09-04 ENCOUNTER — Encounter: Payer: Self-pay | Admitting: Family

## 2022-09-04 ENCOUNTER — Ambulatory Visit (INDEPENDENT_AMBULATORY_CARE_PROVIDER_SITE_OTHER): Payer: Medicare Other | Admitting: Family

## 2022-09-04 VITALS — BP 129/82 | HR 76 | Temp 97.7°F | Ht <= 58 in | Wt 108.6 lb

## 2022-09-04 DIAGNOSIS — Z8673 Personal history of transient ischemic attack (TIA), and cerebral infarction without residual deficits: Secondary | ICD-10-CM

## 2022-09-04 DIAGNOSIS — K5900 Constipation, unspecified: Secondary | ICD-10-CM | POA: Diagnosis not present

## 2022-09-04 DIAGNOSIS — M159 Polyosteoarthritis, unspecified: Secondary | ICD-10-CM

## 2022-09-04 DIAGNOSIS — Z0000 Encounter for general adult medical examination without abnormal findings: Secondary | ICD-10-CM

## 2022-09-04 DIAGNOSIS — G44209 Tension-type headache, unspecified, not intractable: Secondary | ICD-10-CM | POA: Diagnosis not present

## 2022-09-04 DIAGNOSIS — F411 Generalized anxiety disorder: Secondary | ICD-10-CM

## 2022-09-04 DIAGNOSIS — J301 Allergic rhinitis due to pollen: Secondary | ICD-10-CM | POA: Diagnosis not present

## 2022-09-04 DIAGNOSIS — G4700 Insomnia, unspecified: Secondary | ICD-10-CM

## 2022-09-04 DIAGNOSIS — D539 Nutritional anemia, unspecified: Secondary | ICD-10-CM | POA: Diagnosis not present

## 2022-09-04 DIAGNOSIS — I10 Essential (primary) hypertension: Secondary | ICD-10-CM | POA: Diagnosis not present

## 2022-09-04 DIAGNOSIS — Z0001 Encounter for general adult medical examination with abnormal findings: Secondary | ICD-10-CM | POA: Diagnosis not present

## 2022-09-04 DIAGNOSIS — M7072 Other bursitis of hip, left hip: Secondary | ICD-10-CM

## 2022-09-04 DIAGNOSIS — F320 Major depressive disorder, single episode, mild: Secondary | ICD-10-CM

## 2022-09-04 MED ORDER — LISINOPRIL 20 MG PO TABS: 20.0000 mg | ORAL_TABLET | Freq: Every day | ORAL | 3 refills | Status: DC

## 2022-09-04 MED ORDER — LEVOCETIRIZINE DIHYDROCHLORIDE 5 MG PO TABS: 5.0000 mg | ORAL_TABLET | Freq: Every evening | ORAL | 1 refills | Status: DC

## 2022-09-04 MED ORDER — BUTALBITAL-APAP-CAFFEINE 50-325-40 MG PO TABS: ORAL_TABLET | ORAL | 1 refills | Status: DC

## 2022-09-04 MED ORDER — TRAZODONE HCL 50 MG PO TABS: 150.0000 mg | ORAL_TABLET | Freq: Every day | ORAL | 5 refills | Status: DC

## 2022-09-04 MED ORDER — MELOXICAM 7.5 MG PO TABS: 7.5000 mg | ORAL_TABLET | Freq: Every day | ORAL | 0 refills | Status: DC

## 2022-09-04 MED ORDER — FLUTICASONE PROPIONATE 50 MCG/ACT NA SUSP: 2.0000 | Freq: Every day | NASAL | 6 refills | Status: AC

## 2022-09-04 MED ORDER — ALENDRONATE SODIUM 70 MG PO TABS: 70.0000 mg | ORAL_TABLET | ORAL | 3 refills | Status: DC

## 2022-09-04 NOTE — Progress Notes (Signed)
Subjective:    Patient ID: Tracy Jensen, female    DOB: Feb 22, 1956, 67 y.o.   MRN: [id number]  Chief Complaint  Patient presents with   Medical Management of Chronic Issues   Pt presents to the office today for CPE and chronic follow up.   She has her grandson every other week.   She has osteoporosis and takes fosamax weekly. Takes vit d and calcium daily. Last dexa scan was 03/06/21.  She is followed by Ortho as needed for left hip pain and just got steroid injection with mild relief.    Complaining increased allergies. Started taking OTC zyrtec with mild relief.  Hypertension This is a chronic problem. The current episode started more than 1 year ago. The problem has been resolved since onset. The problem is controlled. Associated symptoms include headaches. Pertinent negatives include no malaise/fatigue, peripheral edema or shortness of breath. The current treatment provides moderate improvement.  Arthritis Presents for follow-up visit. She complains of pain and stiffness. Affected locations include the left hip. Pain scale currently: 0 today, but can be 5. Pertinent negatives include no fever.  Anemia Presents for follow-up visit. There has been no confusion, fever or malaise/fatigue.  Anxiety Presents for follow-up visit. Symptoms include excessive worry, insomnia and nervous/anxious behavior. Patient reports no confusion or shortness of breath. Symptoms occur occasionally. The severity of symptoms is mild.   Her past medical history is significant for anemia.  Depression        This is a chronic problem.  The current episode started more than 1 year ago.   The problem occurs intermittently.  Associated symptoms include insomnia and headaches.  Associated symptoms include no helplessness, no hopelessness and not sad. Constipation This is a chronic problem. The current episode started more than 1 year ago. The problem has been waxing and waning since onset. Her stool frequency is 4  to 5 times per week. Pertinent negatives include no fever. She has tried laxatives for the symptoms. The treatment provided moderate relief.  Insomnia Primary symptoms: difficulty falling asleep, frequent awakening, no malaise/fatigue.   The current episode started more than one year. The onset quality is gradual. The problem occurs intermittently.  Headache  This is a chronic problem. The current episode started more than 1 year ago. The problem occurs seasonly. The pain is located in the Frontal region. The pain is mild. Associated symptoms include insomnia. Pertinent negatives include no fever. Her past medical history is significant for hypertension.      Review of Systems  Constitutional:  Negative for fever and malaise/fatigue.  Respiratory:  Negative for shortness of breath.   Musculoskeletal:  Positive for stiffness.  Neurological:  Positive for headaches.  Psychiatric/Behavioral:  Negative for confusion. The patient is nervous/anxious and has insomnia.   All other systems reviewed and are negative.      Objective:   Physical Exam Vitals reviewed.  Constitutional:      General: She is not in acute distress.    Appearance: She is well-developed.  HENT:     Head: Normocephalic and atraumatic.     Right Ear: Tympanic membrane normal.     Left Ear: Tympanic membrane normal.  Eyes:     Pupils: Pupils are equal, round, and reactive to light.  Neck:     Thyroid: No thyromegaly.  Cardiovascular:     Rate and Rhythm: Normal rate and regular rhythm.     Heart sounds: Normal heart sounds. No murmur heard. Pulmonary:  Effort: Pulmonary effort is normal. No respiratory distress.     Breath sounds: Normal breath sounds. No wheezing.  Abdominal:     General: Bowel sounds are normal. There is no distension.     Palpations: Abdomen is soft.     Tenderness: There is no abdominal tenderness.  Musculoskeletal:        General: Tenderness (left bursitis of hip with flexion) present.  Normal range of motion.     Cervical back: Normal range of motion and neck supple.  Skin:    General: Skin is warm and dry.  Neurological:     Mental Status: She is alert and oriented to person, place, and time.     Cranial Nerves: No cranial nerve deficit.     Deep Tendon Reflexes: Reflexes are normal and symmetric.  Psychiatric:        Behavior: Behavior normal.        Thought Content: Thought content normal.        Judgment: Judgment normal.       BP 129/82   Pulse 76   Temp 97.7 F (36.5 C) (Temporal)   Ht 4\' 9"  (1.448 m)   Wt 108 lb 9.6 oz (49.3 kg)   SpO2 100%   BMI 23.50 kg/m      Assessment & Plan:  Tracy Jensen comes in today with chief complaint of Medical Management of Chronic Issues   Diagnosis and orders addressed:  1. Tension headache - butalbital-acetaminophen-caffeine (FIORICET) 50-325-40 MG tablet; TAKE 1 TABLET BY MOUTH EVERY 6 HOURS AS NEEDED FOR HEADACHE  Dispense: 14 tablet; Refill: 1 - CMP14+EGFR; Future - CBC with Differential/Platelet; Future  2. HTN (hypertension), benign - lisinopril (ZESTRIL) 20 MG tablet; Take 1 tablet (20 mg total) by mouth daily. (NEEDS TO BE SEEN BEFORE NEXT REFILL)  Dispense: 90 tablet; Refill: 3 - CMP14+EGFR; Future - CBC with Differential/Platelet; Future  3. Insomnia, unspecified type - traZODone (DESYREL) 50 MG tablet; Take 3 tablets (150 mg total) by mouth at bedtime.  Dispense: 90 tablet; Refill: 5 - CMP14+EGFR; Future - CBC with Differential/Platelet; Future  4. History of CVA (cerebrovascular accident) - CMP14+EGFR; Future - CBC with Differential/Platelet; Future  5. Current mild episode of major depressive disorder without prior episode ([place]) - CMP14+EGFR; Future - CBC with Differential/Platelet; Future  6. GAD (generalized anxiety disorder) - CMP14+EGFR; Future - CBC with Differential/Platelet; Future  7. Constipation, unspecified constipation type - CMP14+EGFR; Future - CBC with  Differential/Platelet; Future  8. Primary osteoarthritis involving multiple joints -Start Mobic 7.5 mg  Avoid other NSAID's  - CMP14+EGFR; Future - CBC with Differential/Platelet; Future - meloxicam (MOBIC) 7.5 MG tablet; Take 1 tablet (7.5 mg total) by mouth daily.  Dispense: 30 tablet; Refill: 0  9. Deficiency anemia - CMP14+EGFR; Future - CBC with Differential/Platelet; Future - Iron, TIBC and Ferritin Panel; Future  10. Annual physical exam - CMP14+EGFR; Future - CBC with Differential/Platelet; Future - Lipid panel; Future - TSH; Future - Iron, TIBC and Ferritin Panel; Future  11. Seasonal allergic rhinitis due to pollen -Start xyzal and flonase  - levocetirizine (XYZAL ALLERGY 24HR) 5 MG tablet; Take 1 tablet (5 mg total) by mouth every evening.  Dispense: 90 tablet; Refill: 1  12. Bursitis of left hip, unspecified bursa Start Mobic 7.5 mg  Keep follow up with Ortho  - meloxicam (MOBIC) 7.5 MG tablet; Take 1 tablet (7.5 mg total) by mouth daily.  Dispense: 30 tablet; Refill: 0   Labs pending Health Maintenance reviewed  Diet and exercise encouraged  Follow up plan: 6 months    Evelina Dun, FNP

## 2022-09-04 NOTE — Patient Instructions (Addendum)
Hip Bursitis  Hip bursitis is the swelling of one or more of the fluid-filled sacs (bursae) in the hip joint. The hip bursae absorb shocks and prevent bones from rubbing against each other. If a bursa becomes irritated, it can fill with extra fluid and become inflamed. Hip bursitis can cause mild to moderate pain, and symptoms often come and go over time. What are the causes? This condition results from increased friction between the hip bones and the tendons around the hip joint. This condition can happen if you: Overuse your hip muscles. Injure your hip. Have weak buttocks muscles. Have bone spurs. Have an infection. In some cases, the cause may not be known. What increases the risk? You are more likely to develop this condition if: You injured your hip previously or had hip surgery. You have a medical condition, such as arthritis, gout, diabetes, or thyroid disease. You have spine problems. You have one leg that is shorter than the other. You participate in athletic activities that include repetitive motion, like running. You participate in sports where there is a risk of injury or falling, such as football, martial arts, or skiing. What are the signs or symptoms? Symptoms may come and go, and they often include: Pain in the hip or groin area. Pain may get worse with movement. Tenderness and swelling of the hip. In rare cases, the bursa may become infected. If this happens, you may get a fever, as well as warmth and redness in the hip area. How is this diagnosed? This condition may be diagnosed based on: Your symptoms. Your medical history. A physical exam. Imaging tests, such as: X-rays to check your bones. MRI or ultrasound to check your tendons and muscles. Bone scan. How is this treated? This condition is treated by resting, icing, applying pressure (compression), and raising (elevating) the injured area. This is called RICE treatment. In some cases, RICE treatment may not  be enough to make your symptoms go away. Treatment may also include: Using crutches, a cane, or a walker to decrease the strain on your hip. Taking medicine to help with swelling and pain. Getting a shot of cortisone medicine near the affected area to reduce swelling and pain. Taking antibiotic medicines if there is an infection. Draining fluid out of the bursa to help relieve swelling and pain. Having surgery to remove a damaged or infected bursa. This is rare. Long-term treatment may include: Physical therapy exercises for strength and flexibility. Identifying the cause of your bursitis to prevent future episodes. Lifestyle changes, such as weight loss, to reduce the strain on the hip. Follow these instructions at home: Managing pain, stiffness, and swelling     If directed, put ice on the affected area. To do this: Put ice in a plastic bag. Place a towel between your skin and the bag. Leave the ice on for 20 minutes, 2-3 times a day. Remove the ice if your skin turns bright red. This is very important. If you cannot feel pain, heat, or cold, you have a greater risk of damage to the area. Elevate your hip as much as you can without feeling pain. To do this, put a pillow under your hips while you lie down. If directed, apply heat to the affected area as often as told by your health care provider. Use the heat source that your health care provider recommends, such as a moist heat pack or a heating pad. Place a towel between your skin and the heat source. Leave the heat   on for 20-30 minutes. Remove the heat if your skin turns bright red. This is especially important if you are unable to feel pain, heat, or cold. You may have a greater risk of getting burned. Activity Do not use your hip to support your body weight until your health care provider says that you can. Use crutches, a cane, or a walker as told by your health care provider. If the affected leg is one that you use to drive, ask  your health care provider if it is safe to drive. Rest and protect your hip as much as possible until your pain and swelling get better. Return to your normal activities as told by your health care provider. Ask your health care provider what activities are safe for you. Do exercises as told by your health care provider. General instructions Take over-the-counter and prescription medicines only as told by your health care provider. Gently massage and stretch your injured area as often as is comfortable. Wear compression wraps only as told by your health care provider. If one of your legs is shorter than the other, get fitted for a shoe insert or orthotic. Your health care provider or physical therapist can tell you where to find these items and what size you need. Maintain a healthy weight. Follow instructions from your health care provider for weight control. These may include dietary restrictions. Keep all follow-up visits. This is important. How is this prevented? Exercise regularly or as told by your health care provider. Wear supportive footwear that is appropriate for your sport and daily activities. Warm up and stretch before being active. Cool down and stretch after being active. Take breaks regularly from repetitive activity. If an activity irritates your hip or causes pain, avoid the activity as much as possible. Avoid sitting down for long periods at a time. Where to find more information [employer]: orthoinfo.aaos.org Contact a health care provider if: You have a fever. You develop new symptoms. You have trouble walking or doing everyday activities. You have pain that gets worse or does not get better with medicine. You develop red skin or a feeling of warmth in your hip area. Get help right away if: You cannot move your hip. You have severe pain. You cannot control the muscles in your feet. Summary Hip bursitis is the swelling of one or more  of the fluid-filled sacs (bursae) in the hip joint. Hip bursitis can cause hip or groin pain, and symptoms often come and go over time. This condition is often treated by resting, icing, applying pressure (compression), and raising (elevating) the injured area. Other treatments may be needed. This information is not intended to replace advice given to you by your health care provider. Make sure you discuss any questions you have with your health care provider.   Hip Bursitis Rehab Ask your health care provider which exercises are safe for you. Do exercises exactly as told by your health care provider and adjust them as directed. It is normal to feel mild stretching, pulling, tightness, or discomfort as you do these exercises. Stop right away if you feel sudden pain or your pain gets worse. Do not begin these exercises until told by your health care provider. Stretching exercise This exercise warms up your muscles and joints and improves the movement and flexibility of your hip. This exercise also helps to relieve pain and stiffness. Iliotibial band stretch An iliotibial band is a strong band of muscle tissue that runs from the outer  side of your hip to the outer side of your thigh and knee. Lie on your side with your left / right leg in the top position. Bend your left / right knee and grab your ankle. Stretch out your bottom arm to help you balance. Slowly bring your knee back so your thigh is slightly behind your body. Slowly lower your knee toward the floor until you feel a gentle stretch on the outside of your left / right thigh. If you do not feel a stretch and your knee will not lower more toward the floor, place the heel of your other foot on top of your knee and pull your knee down toward the floor with your foot. Hold this position for __________ seconds. Slowly return to the starting position. Repeat __________ times. Complete this exercise __________ times a day. Strengthening  exercises These exercises build strength and endurance in your hip and pelvis. Endurance is the ability to use your muscles for a long time, even after they get tired. Bridge This exercise strengthens the muscles that move your thigh backward (hip extensors). Lie on your back on a firm surface with your knees bent and your feet flat on the floor. Tighten your buttocks muscles and lift your buttocks off the floor until your trunk is level with your thighs. Do not arch your back. You should feel the muscles working in your buttocks and the back of your thighs. If you do not feel these muscles, slide your feet 1-2 inches (2.5-5 cm) farther away from your buttocks. If this exercise is too easy, try doing it with your arms crossed over your chest. Hold this position for __________ seconds. Slowly lower your hips to the starting position. Let your muscles relax completely after each repetition. Repeat __________ times. Complete this exercise __________ times a day. Squats This exercise strengthens the muscles in front of your thigh and knee (quadriceps). Stand in front of a table, with your feet and knees pointing straight ahead. You may rest your hands on the table for balance but not for support. Slowly bend your knees and lower your hips like you are going to sit in a chair. Keep your weight over your heels, not over your toes. Keep your lower legs upright so they are parallel with the table legs. Do not let your hips go lower than your knees. Do not bend lower than told by your health care provider. If your hip pain increases, do not bend as low. Hold the squat position for __________ seconds. Slowly push with your legs to return to standing. Do not use your hands to pull yourself to standing. Repeat __________ times. Complete this exercise __________ times a day. Hip hike  Stand sideways on a bottom step. Stand on your left / right leg with your other foot unsupported next to the step. You  can hold on to the railing or wall for balance if needed. Keep your knees straight and your torso square. Then lift your left / right hip up toward the ceiling. Hold this position for __________ seconds. Slowly let your left / right hip lower toward the floor, past the starting position. Your foot should get closer to the floor. Do not lean or bend your knees. Repeat __________ times. Complete this exercise __________ times a day. Single leg stand This exercise increases your balance. Without shoes, stand near a railing or in a doorway. You may hold on to the railing or door frame as needed for balance. Squeeze your left /  right buttock muscles, then lift up your other foot. Do not let your left / right hip push out to the side. It is helpful to stand in front of a mirror for this exercise so you can watch your hip. Hold this position for __________ seconds. Repeat __________ times. Complete this exercise __________ times a day. This information is not intended to replace advice given to you by your health care provider. Make sure you discuss any questions you have with your health care provider. Document Revised: 05/15/2021 Document Reviewed: 05/15/2021 Elsevier Patient Education  [place] Revised: 05/28/2021 Document Reviewed: 05/28/2021 Elsevier Patient Education  [place].

## 2022-09-05 ENCOUNTER — Other Ambulatory Visit: Payer: Medicare Other

## 2022-09-05 DIAGNOSIS — Z0000 Encounter for general adult medical examination without abnormal findings: Secondary | ICD-10-CM

## 2022-09-05 DIAGNOSIS — G4700 Insomnia, unspecified: Secondary | ICD-10-CM

## 2022-09-05 DIAGNOSIS — F411 Generalized anxiety disorder: Secondary | ICD-10-CM

## 2022-09-05 DIAGNOSIS — I10 Essential (primary) hypertension: Secondary | ICD-10-CM

## 2022-09-05 DIAGNOSIS — G44209 Tension-type headache, unspecified, not intractable: Secondary | ICD-10-CM

## 2022-09-05 DIAGNOSIS — Z8673 Personal history of transient ischemic attack (TIA), and cerebral infarction without residual deficits: Secondary | ICD-10-CM

## 2022-09-05 DIAGNOSIS — F320 Major depressive disorder, single episode, mild: Secondary | ICD-10-CM

## 2022-09-05 DIAGNOSIS — K5900 Constipation, unspecified: Secondary | ICD-10-CM

## 2022-09-05 DIAGNOSIS — D539 Nutritional anemia, unspecified: Secondary | ICD-10-CM

## 2022-09-05 DIAGNOSIS — M159 Polyosteoarthritis, unspecified: Secondary | ICD-10-CM

## 2022-09-06 LAB — LIPID PANEL
Chol/HDL Ratio: 1.9 ratio (ref 0.0–4.4)
Cholesterol, Total: 257 mg/dL — ABNORMAL HIGH (ref 100–199)
HDL: 134 mg/dL (ref >39)
LDL Chol Calc (NIH): 113 mg/dL — ABNORMAL HIGH (ref 0–99)
Triglycerides: 62 mg/dL (ref 0–149)
VLDL Cholesterol Cal: 10 mg/dL (ref 5–40)

## 2022-09-06 LAB — CMP14+EGFR
ALT: 15 IU/L (ref 0–32)
AST: 17 IU/L (ref 0–40)
Albumin/Globulin Ratio: 2 (ref 1.2–2.2)
Albumin: 4.5 g/dL (ref 3.9–4.9)
Alkaline Phosphatase: 51 IU/L (ref 44–121)
BUN/Creatinine Ratio: 28 (ref 12–28)
BUN: 22 mg/dL (ref 8–27)
Bilirubin Total: 0.3 mg/dL (ref 0.0–1.2)
CO2: 24 mmol/L (ref 20–29)
Calcium: 9.3 mg/dL (ref 8.7–10.3)
Chloride: 97 mmol/L (ref 96–106)
Creatinine, Ser: 0.8 mg/dL (ref 0.57–1.00)
Globulin, Total: 2.3 g/dL (ref 1.5–4.5)
Glucose: 91 mg/dL (ref 70–99)
Potassium: 4.8 mmol/L (ref 3.5–5.2)
Sodium: 135 mmol/L (ref 134–144)
Total Protein: 6.8 g/dL (ref 6.0–8.5)
eGFR: 81 mL/min/{1.73_m2} (ref >59)

## 2022-09-06 LAB — IRON,TIBC AND FERRITIN PANEL
Ferritin: 87 ng/mL (ref 15–150)
Iron Saturation: 47 % (ref 15–55)
Iron: 141 ug/dL — ABNORMAL HIGH (ref 27–139)
Total Iron Binding Capacity: 300 ug/dL (ref 250–450)
UIBC: 159 ug/dL (ref 118–369)

## 2022-09-06 LAB — CBC WITH DIFFERENTIAL/PLATELET
Basophils Absolute: 0 10*3/uL (ref 0.0–0.2)
Basos: 0 %
EOS (ABSOLUTE): 0.2 10*3/uL (ref 0.0–0.4)
Eos: 2 %
Hematocrit: 39.8 % (ref 34.0–46.6)
Hemoglobin: 12.9 g/dL (ref 11.1–15.9)
Immature Grans (Abs): 0 10*3/uL (ref 0.0–0.1)
Immature Granulocytes: 0 %
Lymphocytes Absolute: 1.8 10*3/uL (ref 0.7–3.1)
Lymphs: 20 %
MCH: 30.4 pg (ref 26.6–33.0)
MCHC: 32.4 g/dL (ref 31.5–35.7)
MCV: 94 fL (ref 79–97)
Monocytes Absolute: 0.7 10*3/uL (ref 0.1–0.9)
Monocytes: 8 %
Neutrophils Absolute: 6.4 10*3/uL (ref 1.4–7.0)
Neutrophils: 70 %
Platelets: 264 10*3/uL (ref 150–450)
RBC: 4.25 x10E6/uL (ref 3.77–5.28)
RDW: 11.6 % — ABNORMAL LOW (ref 11.7–15.4)
WBC: 9.2 10*3/uL (ref 3.4–10.8)

## 2022-09-06 LAB — TSH: TSH: 0.829 u[IU]/mL (ref 0.450–4.500)

## 2022-09-08 ENCOUNTER — Other Ambulatory Visit: Payer: Self-pay | Admitting: Family

## 2022-09-08 MED ORDER — ROSUVASTATIN CALCIUM 5 MG PO TABS: 5.0000 mg | ORAL_TABLET | ORAL | 3 refills | Status: DC

## 2022-10-02 ENCOUNTER — Ambulatory Visit (INDEPENDENT_AMBULATORY_CARE_PROVIDER_SITE_OTHER): Payer: Medicare Other | Admitting: Family Medicine

## 2022-10-02 ENCOUNTER — Encounter: Payer: Self-pay | Admitting: Family Medicine

## 2022-10-02 VITALS — BP 130/84 | HR 81 | Ht <= 58 in | Wt 110.0 lb

## 2022-10-02 DIAGNOSIS — J40 Bronchitis, not specified as acute or chronic: Secondary | ICD-10-CM

## 2022-10-02 MED ORDER — BENZONATATE 100 MG PO CAPS: 100.0000 mg | ORAL_CAPSULE | Freq: Two times a day (BID) | ORAL | 0 refills | Status: DC | PRN

## 2022-10-02 MED ORDER — AMOXICILLIN 500 MG PO CAPS: 500.0000 mg | ORAL_CAPSULE | Freq: Two times a day (BID) | ORAL | 0 refills | Status: DC

## 2022-10-02 NOTE — Progress Notes (Signed)
BP 130/84   Pulse 81   Ht  (1.448 m)   Wt 110 lb (49.9 kg)   SpO2 98%   BMI 23.80 kg/m    Subjective:   Patient ID: Tracy Jensen, female    DOB: 05-21-1956, 67 y.o.   MRN: [id number]  HPI: Tracy Jensen is a 67 y.o. female presenting on 10/02/2022 for Cough, Headache, and Hoarse   HPI Cough and headache and congestion and sore throat and hoarseness Patient is coming in today with cough and congestion and sore throat headache and hoarseness is all been going on over the past 5 or 6 days.  She says it has been worsening and she feels like she started getting some wheezing and she is worried whether she is getting bronchitis.  She is going to have a grandson that was sick a week ago and she feels like that is where it started from because they were watching him.  Her husband is also recently started getting ill as well after her.  Relevant past medical, surgical, family and social history reviewed and updated as indicated. Interim medical history since our last visit reviewed. Allergies and medications reviewed and updated.  Review of Systems  Constitutional:  Negative for chills and fever.  HENT:  Positive for congestion, postnasal drip, rhinorrhea, sinus pressure and sore throat. Negative for ear discharge, ear pain and sneezing.   Eyes:  Negative for pain, redness and visual disturbance.  Respiratory:  Positive for cough and wheezing. Negative for chest tightness and shortness of breath.   Cardiovascular:  Negative for chest pain and leg swelling.  Genitourinary:  Negative for difficulty urinating and dysuria.  Musculoskeletal:  Negative for back pain and gait problem.  Skin:  Negative for rash.  Neurological:  Positive for headaches. Negative for light-headedness.  Psychiatric/Behavioral:  Negative for agitation and behavioral problems.   All other systems reviewed and are negative.   Per HPI unless specifically indicated above   Allergies as of 10/02/2022        Reactions   Azithromycin Hives   Hives after Zpack   Statins    Headache   Omeprazole Other (See Comments)   Headaches        Medication List        Accurate as of October 02, 2022 11:11 AM. If you have any questions, ask your nurse or doctor.          alendronate 70 MG tablet Commonly known as: Fosamax Take 1 tablet (70 mg total) by mouth every 7 (seven) days. Take with a full glass of water on an empty stomach.   amoxicillin 500 MG capsule Commonly known as: AMOXIL Take 1 capsule (500 mg total) by mouth 2 (two) times daily. Started by: Nils Pyle, MD   aspirin EC 81 MG tablet Take 81 mg by mouth at bedtime. Swallow whole.   benzonatate 100 MG capsule Commonly known as: Tessalon Perles Take 1 capsule (100 mg total) by mouth 2 (two) times daily as needed for cough. Started by: Nils Pyle, MD   busPIRone 15 MG tablet Commonly known as: BUSPAR Take 1 tablet (15 mg total) by mouth 3 (three) times daily.   butalbital-acetaminophen-caffeine 50-325-40 MG tablet Commonly known as: FIORICET TAKE 1 TABLET BY MOUTH EVERY 6 HOURS AS NEEDED FOR HEADACHE   CALCIUM 1000 + D PO Take by mouth.   cholecalciferol 25 MCG (1000 UNIT) tablet Commonly known as: VITAMIN D3 Take 1,000 Units by mouth  daily.   cyclobenzaprine 10 MG tablet Commonly known as: FLEXERIL TAKE 1 TABLET BY MOUTH THREE TIMES DAILY   fluticasone 50 MCG/ACT nasal spray Commonly known as: FLONASE Place 2 sprays into both nostrils daily.   levocetirizine 5 MG tablet Commonly known as: Xyzal Allergy 24HR Take 1 tablet (5 mg total) by mouth every evening.   lisinopril 20 MG tablet Commonly known as: ZESTRIL Take 1 tablet (20 mg total) by mouth daily. (NEEDS TO BE SEEN BEFORE NEXT REFILL)   meloxicam 7.5 MG tablet Commonly known as: MOBIC Take 1 tablet (7.5 mg total) by mouth daily.   mirabegron ER 50 MG Tb24 tablet Commonly known as: Myrbetriq Take 1 tablet (50 mg total) by mouth  daily.   polyethylene glycol 17 g packet Commonly known as: MIRALAX / GLYCOLAX Take 17 g by mouth daily as needed for mild constipation or moderate constipation.   rosuvastatin 5 MG tablet Commonly known as: Crestor Take 1 tablet (5 mg total) by mouth every other day.   traZODone 50 MG tablet Commonly known as: DESYREL Take 3 tablets (150 mg total) by mouth at bedtime.   VITAMIN B-12 PO Take by mouth.         Objective:   BP 130/84   Pulse 81   Ht  (1.448 m)   Wt 110 lb (49.9 kg)   SpO2 98%   BMI 23.80 kg/m   Wt Readings from Last 3 Encounters:  10/02/22 110 lb (49.9 kg)  09/04/22 108 lb 9.6 oz (49.3 kg)  05/20/22 110 lb (49.9 kg)    Physical Exam Vitals reviewed.  Constitutional:      General: She is not in acute distress.    Appearance: She is well-developed. She is not diaphoretic.  HENT:     Right Ear: Tympanic membrane, ear canal and external ear normal.     Left Ear: Tympanic membrane, ear canal and external ear normal.     Nose: Mucosal edema and rhinorrhea present.     Right Sinus: No maxillary sinus tenderness or frontal sinus tenderness.     Left Sinus: No maxillary sinus tenderness or frontal sinus tenderness.     Mouth/Throat:     Pharynx: Uvula midline. Posterior oropharyngeal erythema present. No oropharyngeal exudate.     Tonsils: No tonsillar abscesses.  Eyes:     Conjunctiva/sclera: Conjunctivae normal.  Cardiovascular:     Rate and Rhythm: Normal rate and regular rhythm.     Heart sounds: Normal heart sounds. No murmur heard. Pulmonary:     Effort: Pulmonary effort is normal. No respiratory distress.     Breath sounds: Wheezing (Posterior lower lung wheeze) present.  Musculoskeletal:        General: No tenderness. Normal range of motion.  Skin:    General: Skin is warm and dry.     Findings: No rash.  Neurological:     Mental Status: She is alert and oriented to person, place, and time.     Coordination: Coordination normal.   Psychiatric:        Behavior: Behavior normal.       Assessment & Plan:   Problem List Items Addressed This Visit   None Visit Diagnoses     Bronchitis    -  Primary   Relevant Medications   amoxicillin (AMOXIL) 500 MG capsule   benzonatate (TESSALON PERLES) 100 MG capsule       Will give amoxicillin and Tessalon Perles to help.,  Sounds like bronchitis Continue  with Flonase and Mucinex and antihistamine Follow up plan: Return if symptoms worsen or fail to improve.  Counseling provided for all of the vaccine components No orders of the defined types were placed in this encounter.   Arville Care, MD [facility] 10/02/2022, 11:11 AM

## 2022-12-10 ENCOUNTER — Telehealth: Payer: Self-pay | Admitting: Family

## 2022-12-10 NOTE — Telephone Encounter (Signed)
Pt made aware that we do not have samples at this time. Pt states that she will keep checking with [place] for them.

## 2023-02-24 ENCOUNTER — Other Ambulatory Visit: Payer: Self-pay | Admitting: Family

## 2023-02-24 DIAGNOSIS — J301 Allergic rhinitis due to pollen: Secondary | ICD-10-CM

## 2023-03-06 ENCOUNTER — Other Ambulatory Visit: Payer: Self-pay | Admitting: Family

## 2023-03-06 DIAGNOSIS — G44209 Tension-type headache, unspecified, not intractable: Secondary | ICD-10-CM

## 2023-03-26 ENCOUNTER — Other Ambulatory Visit: Payer: Self-pay | Admitting: Family

## 2023-03-26 DIAGNOSIS — N3281 Overactive bladder: Secondary | ICD-10-CM

## 2023-03-28 ENCOUNTER — Other Ambulatory Visit: Payer: Self-pay | Admitting: Family

## 2023-03-28 DIAGNOSIS — N3281 Overactive bladder: Secondary | ICD-10-CM

## 2023-04-13 ENCOUNTER — Ambulatory Visit (INDEPENDENT_AMBULATORY_CARE_PROVIDER_SITE_OTHER): Payer: Medicare Other | Admitting: Family

## 2023-04-13 ENCOUNTER — Encounter: Payer: Self-pay | Admitting: Family

## 2023-04-13 VITALS — BP 106/69 | HR 76 | Temp 97.8°F | Ht <= 58 in | Wt 111.8 lb

## 2023-04-13 DIAGNOSIS — G4700 Insomnia, unspecified: Secondary | ICD-10-CM | POA: Diagnosis not present

## 2023-04-13 DIAGNOSIS — D539 Nutritional anemia, unspecified: Secondary | ICD-10-CM

## 2023-04-13 DIAGNOSIS — K5900 Constipation, unspecified: Secondary | ICD-10-CM

## 2023-04-13 DIAGNOSIS — G44209 Tension-type headache, unspecified, not intractable: Secondary | ICD-10-CM | POA: Insufficient documentation

## 2023-04-13 DIAGNOSIS — I10 Essential (primary) hypertension: Secondary | ICD-10-CM | POA: Diagnosis not present

## 2023-04-13 DIAGNOSIS — M150 Primary generalized (osteo)arthritis: Secondary | ICD-10-CM

## 2023-04-13 DIAGNOSIS — F411 Generalized anxiety disorder: Secondary | ICD-10-CM | POA: Diagnosis not present

## 2023-04-13 DIAGNOSIS — E785 Hyperlipidemia, unspecified: Secondary | ICD-10-CM | POA: Insufficient documentation

## 2023-04-13 DIAGNOSIS — F320 Major depressive disorder, single episode, mild: Secondary | ICD-10-CM

## 2023-04-13 DIAGNOSIS — N3281 Overactive bladder: Secondary | ICD-10-CM | POA: Diagnosis not present

## 2023-04-13 MED ORDER — LISINOPRIL 20 MG PO TABS: 20.0000 mg | ORAL_TABLET | Freq: Every day | ORAL | 3 refills | Status: DC

## 2023-04-13 MED ORDER — OXYBUTYNIN CHLORIDE ER 5 MG PO TB24
5.0000 mg | ORAL_TABLET | Freq: Every day | ORAL | 2 refills | Status: DC
Start: 2023-04-13 — End: 2023-06-25

## 2023-04-13 MED ORDER — BUSPIRONE HCL 15 MG PO TABS: 15.0000 mg | ORAL_TABLET | Freq: Three times a day (TID) | ORAL | 3 refills | Status: DC

## 2023-04-13 MED ORDER — BUTALBITAL-APAP-CAFFEINE 50-325-40 MG PO TABS: ORAL_TABLET | ORAL | 1 refills | Status: DC

## 2023-04-13 MED ORDER — ROSUVASTATIN CALCIUM 5 MG PO TABS: 5.0000 mg | ORAL_TABLET | ORAL | 3 refills | Status: DC

## 2023-04-13 NOTE — Progress Notes (Addendum)
Subjective:    Patient ID: Tracy Jensen, female    DOB: 14-Nov-1955, 67 y.o.   MRN: [id number]  Chief Complaint  Patient presents with   Medical Management of Chronic Issues    Pt presents to the office today for chronic follow up.   She has her grandson every other week.    She has osteoporosis and takes fosamax weekly. Takes Vit D and calcium daily. Last dexa scan was 03/06/21.   She is followed by Ortho as needed for left hip pain and just got steroid injection with mild relief.     Complaining increased allergies. Started taking OTC zyrtec with mild relief.  Arthritis Presents for follow-up visit. She complains of pain and stiffness. Affected locations include the right shoulder, left shoulder, left hip and right hip. Her pain is at a severity of 8/10.  Hypertension This is a chronic problem. The current episode started more than 1 year ago. The problem has been resolved since onset. Associated symptoms include anxiety and headaches. Pertinent negatives include no malaise/fatigue, peripheral edema or shortness of breath. Risk factors for coronary artery disease include dyslipidemia and sedentary lifestyle. The current treatment provides moderate improvement.  Anxiety Presents for follow-up visit. Symptoms include excessive worry, insomnia and nervous/anxious behavior. Patient reports no shortness of breath. Symptoms occur occasionally. The severity of symptoms is mild.   Her past medical history is significant for anemia.  Depression        This is a chronic problem.  The current episode started more than 1 year ago.   The problem occurs intermittently.  Associated symptoms include insomnia and headaches.  Associated symptoms include no helplessness, no hopelessness and not sad.  Past medical history includes anxiety.   Anemia Presents for follow-up visit. There has been no malaise/fatigue.  Constipation This is a chronic problem. The current episode started more than 1 year ago.  The problem has been resolved since onset. Her stool frequency is 1 time per day. She has tried diet changes for the symptoms. The treatment provided moderate relief.  Insomnia Primary symptoms: sleep disturbance, difficulty falling asleep, no malaise/fatigue.   The current episode started more than one year. The onset quality is gradual. Past treatments include meditation. The treatment provided moderate relief. PMH includes: depression.   Hyperlipidemia This is a chronic problem. The current episode started more than 1 year ago. Pertinent negatives include no shortness of breath. Current antihyperlipidemic treatment includes statins. The current treatment provides moderate improvement of lipids. Risk factors for coronary artery disease include dyslipidemia, hypertension and a sedentary lifestyle.  Headache  This is a chronic problem. The current episode started more than 1 year ago. Episode frequency: once a month. Associated symptoms include insomnia. Her past medical history is significant for hypertension.  Urinary Frequency  This is a chronic problem. The current episode started more than 1 year ago. The problem occurs intermittently. The patient is experiencing no pain. Associated symptoms include frequency. The treatment provided moderate relief.      Review of Systems  Constitutional:  Negative for malaise/fatigue.  Respiratory:  Negative for shortness of breath.   Gastrointestinal:  Positive for constipation.  Genitourinary:  Positive for frequency.  Musculoskeletal:  Positive for arthritis and stiffness.  Neurological:  Positive for headaches.  Psychiatric/Behavioral:  Positive for depression and sleep disturbance. The patient is nervous/anxious and has insomnia.   All other systems reviewed and are negative.      Objective:   Physical Exam Vitals reviewed.  Constitutional:      General: She is not in acute distress.    Appearance: She is well-developed.  HENT:     Head:  Normocephalic and atraumatic.     Right Ear: Tympanic membrane normal.     Left Ear: Tympanic membrane normal.  Eyes:     Pupils: Pupils are equal, round, and reactive to light.  Neck:     Thyroid: No thyromegaly.  Cardiovascular:     Rate and Rhythm: Normal rate and regular rhythm.     Heart sounds: Normal heart sounds. No murmur heard. Pulmonary:     Effort: Pulmonary effort is normal. No respiratory distress.     Breath sounds: Normal breath sounds. No wheezing.  Abdominal:     General: Bowel sounds are normal. There is no distension.     Palpations: Abdomen is soft.     Tenderness: There is no abdominal tenderness.  Musculoskeletal:        General: No tenderness. Normal range of motion.     Cervical back: Normal range of motion and neck supple.  Skin:    General: Skin is warm and dry.  Neurological:     Mental Status: She is alert and oriented to person, place, and time.     Cranial Nerves: No cranial nerve deficit.     Deep Tendon Reflexes: Reflexes are normal and symmetric.  Psychiatric:        Behavior: Behavior normal.        Thought Content: Thought content normal.        Judgment: Judgment normal.       BP 106/69   Pulse 76   Temp 97.8 F (36.6 C) (Temporal)   Ht 4\' 9"  (1.448 m)   Wt 111 lb 12.8 oz (50.7 kg)   SpO2 96%   BMI 24.19 kg/m      Assessment & Plan:   PAXTYN CHITWOOD comes in today with chief complaint of Medical Management of Chronic Issues   Diagnosis and orders addressed:  1. GAD (generalized anxiety disorder) - busPIRone (BUSPAR) 15 MG tablet; Take 1 tablet (15 mg total) by mouth 3 (three) times daily.  Dispense: 270 tablet; Refill: 3 - CMP14+EGFR  2. Tension headache - butalbital-acetaminophen-caffeine (FIORICET) 50-325-40 MG tablet; TAKE 1 TABLET BY MOUTH EVERY 6 HOURS AS NEEDED FOR HEADACHE  Dispense: 14 tablet; Refill: 1 - CMP14+EGFR  3. HTN (hypertension), benign - lisinopril (ZESTRIL) 20 MG tablet; Take 1 tablet (20 mg total) by  mouth daily. (NEEDS TO BE SEEN BEFORE NEXT REFILL)  Dispense: 90 tablet; Refill: 3 - CMP14+EGFR  4. Primary osteoarthritis involving multiple joints - CMP14+EGFR  5. Constipation, unspecified constipation type - CMP14+EGFR  6. Current mild episode of major depressive disorder without prior episode (HCC) - CMP14+EGFR  7. Insomnia, unspecified type  - CMP14+EGFR  8. Deficiency anemia - CMP14+EGFR  9. Overactive bladder -Will start oxybutynin 5 mg - oxybutynin (DITROPAN-XL) 5 MG 24 hr tablet; Take 1 tablet (5 mg total) by mouth at bedtime.  Dispense: 90 tablet; Refill: 2 - CMP14+EGFR  10. Hyperlipidemia, unspecified hyperlipidemia type  - rosuvastatin (CRESTOR) 5 MG tablet; Take 1 tablet (5 mg total) by mouth every other day.  Dispense: 45 tablet; Refill: 3 - CMP14+EGFR   Labs pending Continue current medications  Handicap form completed Health Maintenance reviewed- Will get TDAP at [facility] Diet and exercise encouraged  Follow up plan: 6 months    Jannifer Rodney, FNP

## 2023-04-13 NOTE — Patient Instructions (Addendum)

## 2023-04-14 LAB — CMP14+EGFR
ALT: 15 [IU]/L (ref 0–32)
AST: 18 [IU]/L (ref 0–40)
Albumin: 4.5 g/dL (ref 3.9–4.9)
Alkaline Phosphatase: 61 [IU]/L (ref 44–121)
BUN/Creatinine Ratio: 23 (ref 12–28)
BUN: 17 mg/dL (ref 8–27)
Bilirubin Total: <0.2 mg/dL (ref 0.0–1.2)
CO2: 24 mmol/L (ref 20–29)
Calcium: 9.6 mg/dL (ref 8.7–10.3)
Chloride: 106 mmol/L (ref 96–106)
Creatinine, Ser: 0.73 mg/dL (ref 0.57–1.00)
Globulin, Total: 2.3 g/dL (ref 1.5–4.5)
Glucose: 101 mg/dL — ABNORMAL HIGH (ref 70–99)
Potassium: 4.3 mmol/L (ref 3.5–5.2)
Sodium: 148 mmol/L — ABNORMAL HIGH (ref 134–144)
Total Protein: 6.8 g/dL (ref 6.0–8.5)
eGFR: 90 mL/min/{1.73_m2} (ref >59)

## 2023-05-22 ENCOUNTER — Telehealth: Payer: Self-pay | Admitting: Family

## 2023-05-22 NOTE — Telephone Encounter (Signed)
Patient informed that Dexa is still down and we have added her to the list Tracy Jensen is keeping to call patients when it is repaired.

## 2023-05-25 ENCOUNTER — Ambulatory Visit (INDEPENDENT_AMBULATORY_CARE_PROVIDER_SITE_OTHER): Payer: Medicare Other

## 2023-05-25 VITALS — Ht <= 58 in | Wt 111.0 lb

## 2023-05-25 DIAGNOSIS — Z0000 Encounter for general adult medical examination without abnormal findings: Secondary | ICD-10-CM | POA: Diagnosis not present

## 2023-05-25 NOTE — Patient Instructions (Signed)
Tracy Jensen , Thank you for taking time to come for your Medicare Wellness Visit. I appreciate your ongoing commitment to your health goals. Please review the following plan we discussed and let me know if I can assist you in the future.   Referrals/Orders/Follow-Ups/Clinician Recommendations: Aim for 30 minutes of exercise or brisk walking, 6-8 glasses of water, and 5 servings of fruits and vegetables each day.  This is a list of the screening recommended for you and due dates:  Health Maintenance  Topic Date Due   COVID-19 Vaccine (3 - 2023-24 season) 02/15/2023   DEXA scan (bone density measurement)  03/07/2023   DTaP/Tdap/Td vaccine (2 - Td or Tdap) 04/12/2024*   Mammogram  07/22/2023   Colon Cancer Screening  12/21/2023   Medicare Annual Wellness Visit  05/24/2024   Pneumonia Vaccine  Completed   Flu Shot  Completed   Hepatitis C Screening  Completed   Zoster (Shingles) Vaccine  Completed   HPV Vaccine  Aged Out  *Topic was postponed. The date shown is not the original due date.    Advanced directives: (ACP Link)Information on Advanced Care Planning can be found at [facility] Advance Health Care Directives Advance Health Care Directives ([email or web address]   Next Medicare Annual Wellness Visit scheduled for next year: Yes

## 2023-05-25 NOTE — Progress Notes (Signed)
Subjective:   Tracy Jensen is a 67 y.o. female who presents for Medicare Annual (Subsequent) preventive examination.  Visit Complete: Virtual I connected with  [name] on 05/25/23 by a audio enabled telemedicine application and verified that I am speaking with the correct person using two identifiers.  Patient Location: Home  Provider Location: Home Office  I discussed the limitations of evaluation and management by telemedicine. The patient expressed understanding and agreed to proceed.  Vital Signs: Because this visit was a virtual/telehealth visit, some criteria may be missing or patient reported. Any vitals not documented were not able to be obtained and vitals that have been documented are patient reported.  Cardiac Risk Factors include: advanced age (>26men, >53 women);dyslipidemia;hypertension     Objective:    Today's Vitals   05/25/23 1101  Weight: 111 lb (50.3 kg)  Height: 4\' 9"  (1.448 m)   Body mass index is 24.02 kg/m.     05/25/2023   11:20 AM 05/20/2022   10:14 AM 05/06/2021    4:30 PM 11/29/2018    6:49 PM 08/16/2018    9:13 AM 07/11/2018    1:00 AM 07/10/2018    1:21 PM  Advanced Directives  Does Patient Have a Medical Advance Directive? No No No No No No No  Would patient like information on creating a medical advance directive? Yes (MAU/Ambulatory/Procedural Areas - Information given) No - Patient declined No - Patient declined   Yes (Inpatient - patient defers creating a medical advance directive at this time) No - Patient declined    Current Medications (verified) Outpatient Encounter Medications as of 05/25/2023  Medication Sig   alendronate (FOSAMAX) 70 MG tablet Take 1 tablet (70 mg total) by mouth every 7 (seven) days. Take with a full glass of water on an empty stomach.   aspirin EC 81 MG tablet Take 81 mg by mouth at bedtime. Swallow whole.   busPIRone (BUSPAR) 15 MG tablet Take 1 tablet (15 mg total) by mouth 3 (three) times daily.    butalbital-acetaminophen-caffeine (FIORICET) 50-325-40 MG tablet TAKE 1 TABLET BY MOUTH EVERY 6 HOURS AS NEEDED FOR HEADACHE   Calcium Carb-Cholecalciferol (CALCIUM 1000 + D PO) Take by mouth.   cholecalciferol (VITAMIN D3) 25 MCG (1000 UNIT) tablet Take 1,000 Units by mouth daily.   Cyanocobalamin (VITAMIN B-12 PO) Take by mouth.   cyclobenzaprine (FLEXERIL) 10 MG tablet TAKE 1 TABLET BY MOUTH THREE TIMES DAILY   fluticasone (FLONASE) 50 MCG/ACT nasal spray Place 2 sprays into both nostrils daily.   levocetirizine (XYZAL) 5 MG tablet TAKE 1 TABLET BY MOUTH ONCE DAILY IN THE EVENING   lisinopril (ZESTRIL) 20 MG tablet Take 1 tablet (20 mg total) by mouth daily. (NEEDS TO BE SEEN BEFORE NEXT REFILL)   meloxicam (MOBIC) 7.5 MG tablet Take 1 tablet (7.5 mg total) by mouth daily.   oxybutynin (DITROPAN-XL) 5 MG 24 hr tablet Take 1 tablet (5 mg total) by mouth at bedtime.   polyethylene glycol (MIRALAX / GLYCOLAX) packet Take 17 g by mouth daily as needed for mild constipation or moderate constipation.   rosuvastatin (CRESTOR) 5 MG tablet Take 1 tablet (5 mg total) by mouth every other day.   traZODone (DESYREL) 50 MG tablet Take 3 tablets (150 mg total) by mouth at bedtime.   No facility-administered encounter medications on file as of 05/25/2023.    Allergies (verified) Azithromycin, Statins, and Omeprazole   History: Past Medical History:  Diagnosis Date   Anemia  Anxiety    Arthritis    Esophageal stricture    Gastric ulcer    GI bleed    Hiatal hernia    Hypertension    Insomnia    Migraines    Sciatica    TIA (transient ischemic attack)    Past Surgical History:  Procedure Laterality Date   CAROTID ARTERY ANGIOPLASTY  09/2000   CATARACT EXTRACTION, BILATERAL Bilateral    CESAREAN SECTION     1991, 1984   COLONOSCOPY     ROTATOR CUFF REPAIR Right 2020   UPPER GASTROINTESTINAL ENDOSCOPY  11/01/2021   Dr.Beavers   Family History  Problem Relation Age of Onset    Diabetes Mother    Asthma Mother    Hypertension Mother    Hypertension Father    Heart disease Father    Colon cancer Neg Hx    Esophageal cancer Neg Hx    Stomach cancer Neg Hx    Breast cancer Neg Hx    Social History   Socioeconomic History   Marital status: Married    Spouse name: Not on file   Number of children: 2   Years of education: Not on file   Highest education level: Not on file  Occupational History   Occupation: retired  Tobacco Use   Smoking status: Never   Smokeless tobacco: Never  Vaping Use   Vaping status: Never Used  Substance and Sexual Activity   Alcohol use: Yes    Comment: occ   Drug use: No   Sexual activity: Not on file  Other Topics Concern   Not on file  Social History Narrative   Not on file   Social Determinants of Health   Financial Resource Strain: Low Risk  (05/25/2023)   Overall Financial Resource Strain (CARDIA)    Difficulty of Paying Living Expenses: Not hard at all  Food Insecurity: No Food Insecurity (05/25/2023)   Hunger Vital Sign    Worried About Running Out of Food in the Last Year: Never true    Ran Out of Food in the Last Year: Never true  Transportation Needs: No Transportation Needs (05/25/2023)   PRAPARE - Administrator, Civil Service (Medical): No    Lack of Transportation (Non-Medical): No  Physical Activity: Insufficiently Active (05/25/2023)   Exercise Vital Sign    Days of Exercise per Week: 3 days    Minutes of Exercise per Session: 30 min  Stress: No Stress Concern Present (05/25/2023)   [employer] of Occupational Health - Occupational Stress Questionnaire    Feeling of Stress : Not at all  Social Connections: Moderately Integrated (05/25/2023)   Social Connection and Isolation Panel [NHANES]    Frequency of Communication with Friends and Family: More than three times a week    Frequency of Social Gatherings with Friends and Family: Three times a week    Attends Religious Services: More  than 4 times per year    Active Member of Clubs or Organizations: No    Attends Banker Meetings: Never    Marital Status: Married    Tobacco Counseling Counseling given: Not Answered   Clinical Intake:  Pre-visit preparation completed: Yes  Pain : No/denies pain     Diabetes: No  How often do you need to have someone help you when you read instructions, pamphlets, or other written materials from your doctor or pharmacy?: 1 - Never  Interpreter Needed?: No  Information entered by :: Kandis Fantasia LPN  Activities of Daily Living    05/25/2023   11:20 AM  In your present state of health, do you have any difficulty performing the following activities:  Hearing? 0  Vision? 0  Difficulty concentrating or making decisions? 0  Walking or climbing stairs? 0  Dressing or bathing? 0  Doing errands, shopping? 0  Preparing Food and eating ? N  Using the Toilet? N  In the past six months, have you accidently leaked urine? N  Do you have problems with loss of bowel control? N  Managing your Medications? N  Managing your Finances? N  Housekeeping or managing your Housekeeping? N    Patient Care Team: Junie Spencer, FNP as PCP - General (Family Medicine) Michaelle Copas, MD as Referring Physician (Optometry) Valeria Batman, MD (Inactive) as Consulting Physician (Orthopedic Surgery)  Indicate any recent Medical Services you may have received from other than [facility] providers in the past year (date may be approximate).     Assessment:   This is a routine wellness examination for [employer].  Hearing/Vision screen Hearing Screening - Comments:: Denies hearing difficulties   Vision Screening - Comments:: Wears rx glasses - up to date with routine eye exams with Dr. Conley Rolls     Goals Addressed             This Visit's Progress    Remain active and independent        Depression Screen    05/25/2023   11:18 AM 04/13/2023   10:11 AM 10/02/2022   10:48 AM  09/04/2022    8:23 AM 10/17/2021    8:19 AM 10/11/2021    9:36 AM 09/03/2021   10:08 AM  PHQ 2/9 Scores  PHQ - 2 Score 0 0 0 0 0 0 0  PHQ- 9 Score 0 0 0 0 1 0 0    Fall Risk    05/25/2023   11:19 AM 04/13/2023   10:11 AM 10/02/2022   10:48 AM 09/04/2022    8:23 AM 05/20/2022   10:11 AM  Fall Risk   Falls in the past year? 0 0 0 0 0  Number falls in past yr: 0 0   0  Injury with Fall? 0 0   0  Risk for fall due to : No Fall Risks History of fall(s)   No Fall Risks  Follow up Falls prevention discussed;Education provided;Falls evaluation completed Falls evaluation completed;Education provided   Falls prevention discussed    MEDICARE RISK AT HOME: Medicare Risk at Home Any stairs in or around the home?: No If so, are there any without handrails?: No Home free of loose throw rugs in walkways, pet beds, electrical cords, etc?: Yes Adequate lighting in your home to reduce risk of falls?: Yes Life alert?: No Use of a cane, walker or w/c?: No Grab bars in the bathroom?: Yes Shower chair or bench in shower?: No Elevated toilet seat or a handicapped toilet?: Yes  TIMED UP AND GO:  Was the test performed?  No    Cognitive Function:        05/25/2023   11:20 AM 05/20/2022   10:15 AM 05/06/2021    4:29 PM  6CIT Screen  What Year? 0 points 0 points 0 points  What month? 0 points 0 points 0 points  What time? 0 points 0 points 0 points  Count back from 20 0 points 0 points 0 points  Months in reverse 0 points 0 points 0 points  Repeat  phrase 0 points 0 points 0 points  Total Score 0 points 0 points 0 points    Immunizations Immunization History  Administered Date(s) Administered   Fluad Quad(high Dose 65+) 03/06/2021, 03/07/2022, 04/06/2023   Hepatitis B, ADULT 03/27/2014, 11/30/2014, 03/20/2015   Influenza Split 01/26/2013   Influenza, High Dose Seasonal PF 03/08/2018, 04/03/2023   Influenza,inj,Quad PF,6+ Mos 02/17/2019   Influenza-Unspecified 02/11/2017, 02/17/2019   MMR  10/30/2017   Moderna Sars-Covid-2 Vaccination 09/05/2019, 10/03/2019   Pneumococcal Conjugate-13 03/20/2020   Pneumococcal Polysaccharide-23 05/14/2021   Tdap 03/14/2013   Zoster Recombinant(Shingrix) 09/18/2016, 12/30/2016    TDAP status: Due, Education has been provided regarding the importance of this vaccine. Advised may receive this vaccine at local pharmacy or Health Dept. Aware to provide a copy of the vaccination record if obtained from local pharmacy or Health Dept. Verbalized acceptance and understanding.  Flu Vaccine status: Up to date  Pneumococcal vaccine status: Up to date  Covid-19 vaccine status: Information provided on how to obtain vaccines.   Qualifies for Shingles Vaccine? Yes   Zostavax completed No   Shingrix Completed?: Yes  Screening Tests Health Maintenance  Topic Date Due   COVID-19 Vaccine (3 - 2023-24 season) 02/15/2023   DEXA SCAN  03/07/2023   DTaP/Tdap/Td (2 - Td or Tdap) 04/12/2024 (Originally 03/15/2023)   MAMMOGRAM  07/22/2023   Colonoscopy  12/21/2023   Medicare Annual Wellness (AWV)  05/24/2024   Pneumonia Vaccine 48+ Years old  Completed   INFLUENZA VACCINE  Completed   Hepatitis C Screening  Completed   Zoster Vaccines- Shingrix  Completed   HPV VACCINES  Aged Out    Health Maintenance  Health Maintenance Due  Topic Date Due   COVID-19 Vaccine (3 - 2023-24 season) 02/15/2023   DEXA SCAN  03/07/2023    Colorectal cancer screening: Type of screening: Colonoscopy. Completed 12/20/13. Repeat every 10 years  Mammogram status: Completed 07/21/22. Repeat every year  Bone Density status: Completed 03/06/21. Results reflect: Bone density results: OSTEOPOROSIS. Repeat every 2 years.  Lung Cancer Screening: (Low Dose CT Chest recommended if Age 21-80 years, 20 pack-year currently smoking OR have quit w/in 15years.) does not qualify.   Lung Cancer Screening Referral: n/a  Additional Screening:  Hepatitis C Screening: does qualify; Completed  10/03/19  Vision Screening: Recommended annual ophthalmology exams for early detection of glaucoma and other disorders of the eye. Is the patient up to date with their annual eye exam?  Yes  Who is the provider or what is the name of the office in which the patient attends annual eye exams? Dr. Conley Rolls  If pt is not established with a provider, would they like to be referred to a provider to establish care? No .   Dental Screening: Recommended annual dental exams for proper oral hygiene  Community Resource Referral / Chronic Care Management: CRR required this visit?  No   CCM required this visit?  No     Plan:     I have personally reviewed and noted the following in the patient's chart:   Medical and social history Use of alcohol, tobacco or illicit drugs  Current medications and supplements including opioid prescriptions. Patient is not currently taking opioid prescriptions. Functional ability and status Nutritional status Physical activity Advanced directives List of other physicians Hospitalizations, surgeries, and ER visits in previous 12 months Vitals Screenings to include cognitive, depression, and falls Referrals and appointments  In addition, I have reviewed and discussed with patient certain preventive protocols, quality metrics,  and best practice recommendations. A written personalized care plan for preventive services as well as general preventive health recommendations were provided to patient.     Kandis Fantasia [place], [place]   [id number]   After Visit Summary: (MyChart) Due to this being a telephonic visit, the after visit summary with patients personalized plan was offered to patient via MyChart   Nurse Notes: No concerns at this time

## 2023-06-13 DIAGNOSIS — R079 Chest pain, unspecified: Secondary | ICD-10-CM | POA: Diagnosis not present

## 2023-06-13 DIAGNOSIS — Z20822 Contact with and (suspected) exposure to covid-19: Secondary | ICD-10-CM | POA: Diagnosis not present

## 2023-06-13 DIAGNOSIS — R051 Acute cough: Secondary | ICD-10-CM | POA: Diagnosis not present

## 2023-06-24 ENCOUNTER — Ambulatory Visit (INDEPENDENT_AMBULATORY_CARE_PROVIDER_SITE_OTHER): Payer: Medicare Other

## 2023-06-24 ENCOUNTER — Telehealth: Payer: Self-pay

## 2023-06-24 ENCOUNTER — Other Ambulatory Visit: Payer: Self-pay | Admitting: Family

## 2023-06-24 DIAGNOSIS — Z780 Asymptomatic menopausal state: Secondary | ICD-10-CM

## 2023-06-24 DIAGNOSIS — M810 Age-related osteoporosis without current pathological fracture: Secondary | ICD-10-CM | POA: Diagnosis not present

## 2023-06-24 NOTE — Telephone Encounter (Signed)
 Patient wants to know if you can increase her oxybutynin from 5mg  to 10mg ; she feels like she is still having to urinate often

## 2023-06-25 MED ORDER — OXYBUTYNIN CHLORIDE ER 10 MG PO TB24: 10.0000 mg | ORAL_TABLET | Freq: Every day | ORAL | 1 refills | Status: DC

## 2023-06-25 NOTE — Telephone Encounter (Signed)
Oxybutynin 10 mg Prescription sent to pharmacy

## 2023-06-25 NOTE — Telephone Encounter (Signed)
 Patient aware and verbalized understanding.

## 2023-06-25 NOTE — Addendum Note (Signed)
 Addended by: Jannifer Rodney A on: 06/25/2023 01:48 PM   Modules accepted: Orders

## 2023-07-13 ENCOUNTER — Other Ambulatory Visit: Payer: Self-pay | Admitting: Family

## 2023-07-13 DIAGNOSIS — Z1231 Encounter for screening mammogram for malignant neoplasm of breast: Secondary | ICD-10-CM

## 2023-07-27 ENCOUNTER — Ambulatory Visit
Admission: RE | Admit: 2023-07-27 | Discharge: 2023-07-27 | Disposition: A | Discharge status: Home / Self Care | Payer: Medicare Other | Source: Ambulatory Visit | Attending: Family | Admitting: Family

## 2023-07-27 DIAGNOSIS — Z1231 Encounter for screening mammogram for malignant neoplasm of breast: Secondary | ICD-10-CM | POA: Diagnosis not present

## 2023-08-10 ENCOUNTER — Other Ambulatory Visit: Payer: Self-pay | Admitting: *Deleted

## 2023-08-10 MED ORDER — ALENDRONATE SODIUM 70 MG PO TABS: 70.0000 mg | ORAL_TABLET | ORAL | 0 refills | Status: DC

## 2023-08-22 ENCOUNTER — Other Ambulatory Visit: Payer: Self-pay | Admitting: Family

## 2023-08-22 DIAGNOSIS — J301 Allergic rhinitis due to pollen: Secondary | ICD-10-CM

## 2023-09-03 ENCOUNTER — Other Ambulatory Visit: Payer: Self-pay | Admitting: Family

## 2023-09-03 DIAGNOSIS — G4700 Insomnia, unspecified: Secondary | ICD-10-CM

## 2023-10-12 ENCOUNTER — Ambulatory Visit (INDEPENDENT_AMBULATORY_CARE_PROVIDER_SITE_OTHER): Payer: Medicare Other | Admitting: Family

## 2023-10-12 ENCOUNTER — Encounter: Payer: Self-pay | Admitting: Family

## 2023-10-12 VITALS — BP 124/79 | HR 70 | Temp 97.5°F | Ht <= 58 in | Wt 112.4 lb

## 2023-10-12 DIAGNOSIS — Z23 Encounter for immunization: Secondary | ICD-10-CM | POA: Diagnosis not present

## 2023-10-12 DIAGNOSIS — Z0000 Encounter for general adult medical examination without abnormal findings: Secondary | ICD-10-CM

## 2023-10-12 DIAGNOSIS — M150 Primary generalized (osteo)arthritis: Secondary | ICD-10-CM | POA: Diagnosis not present

## 2023-10-12 DIAGNOSIS — Z1211 Encounter for screening for malignant neoplasm of colon: Secondary | ICD-10-CM

## 2023-10-12 DIAGNOSIS — M7072 Other bursitis of hip, left hip: Secondary | ICD-10-CM | POA: Diagnosis not present

## 2023-10-12 DIAGNOSIS — E785 Hyperlipidemia, unspecified: Secondary | ICD-10-CM

## 2023-10-12 DIAGNOSIS — K5900 Constipation, unspecified: Secondary | ICD-10-CM

## 2023-10-12 DIAGNOSIS — J301 Allergic rhinitis due to pollen: Secondary | ICD-10-CM

## 2023-10-12 DIAGNOSIS — Z0001 Encounter for general adult medical examination with abnormal findings: Secondary | ICD-10-CM

## 2023-10-12 DIAGNOSIS — D539 Nutritional anemia, unspecified: Secondary | ICD-10-CM

## 2023-10-12 DIAGNOSIS — I10 Essential (primary) hypertension: Secondary | ICD-10-CM | POA: Diagnosis not present

## 2023-10-12 DIAGNOSIS — G4700 Insomnia, unspecified: Secondary | ICD-10-CM | POA: Diagnosis not present

## 2023-10-12 DIAGNOSIS — F411 Generalized anxiety disorder: Secondary | ICD-10-CM

## 2023-10-12 DIAGNOSIS — G44209 Tension-type headache, unspecified, not intractable: Secondary | ICD-10-CM | POA: Diagnosis not present

## 2023-10-12 DIAGNOSIS — F320 Major depressive disorder, single episode, mild: Secondary | ICD-10-CM

## 2023-10-12 DIAGNOSIS — N3281 Overactive bladder: Secondary | ICD-10-CM

## 2023-10-12 DIAGNOSIS — Z8673 Personal history of transient ischemic attack (TIA), and cerebral infarction without residual deficits: Secondary | ICD-10-CM

## 2023-10-12 LAB — LIPID PANEL

## 2023-10-12 MED ORDER — CYCLOBENZAPRINE HCL 10 MG PO TABS: 10.0000 mg | ORAL_TABLET | Freq: Three times a day (TID) | ORAL | 1 refills | Status: DC

## 2023-10-12 MED ORDER — MELOXICAM 7.5 MG PO TABS: 7.5000 mg | ORAL_TABLET | Freq: Every day | ORAL | 0 refills | Status: DC

## 2023-10-12 MED ORDER — LISINOPRIL 20 MG PO TABS: 20.0000 mg | ORAL_TABLET | Freq: Every day | ORAL | 3 refills | Status: AC

## 2023-10-12 MED ORDER — TRAZODONE HCL 50 MG PO TABS: 150.0000 mg | ORAL_TABLET | Freq: Every day | ORAL | 0 refills | Status: DC

## 2023-10-12 MED ORDER — ALENDRONATE SODIUM 70 MG PO TABS: 70.0000 mg | ORAL_TABLET | ORAL | 1 refills | Status: DC

## 2023-10-12 MED ORDER — BUTALBITAL-APAP-CAFFEINE 50-325-40 MG PO TABS
ORAL_TABLET | ORAL | 1 refills | Status: DC
Start: 2023-10-12 — End: 2024-02-29

## 2023-10-12 MED ORDER — LEVOCETIRIZINE DIHYDROCHLORIDE 5 MG PO TABS: 5.0000 mg | ORAL_TABLET | Freq: Every evening | ORAL | 1 refills | Status: DC

## 2023-10-12 MED ORDER — ROSUVASTATIN CALCIUM 5 MG PO TABS: 5.0000 mg | ORAL_TABLET | ORAL | 3 refills | Status: AC

## 2023-10-12 NOTE — Progress Notes (Signed)
 Subjective:    Patient ID: Tracy Jensen, female    DOB: 1955-08-03, 68 y.o.   MRN: [id number]  Chief Complaint  Patient presents with   Medical Management of Chronic Issues    Pt presents to the office today for CPE and chronic follow up.   She has her grandson every other week.    She has osteoporosis and takes fosamax  weekly. Takes Vit D and calcium  daily. Last dexa scan was 03/06/21.   She is followed by Ortho as needed for left hip pain and just got steroid injection with mild relief.     Complaining increased allergies. Started taking OTC zyrtec with mild relief.  Arthritis Presents for follow-up visit. She complains of pain and stiffness. Affected locations include the right shoulder, left shoulder, left hip and right hip. Her pain is at a severity of 5/10.  Hypertension This is a chronic problem. The current episode started more than 1 year ago. The problem has been resolved since onset. The problem is controlled. Associated symptoms include anxiety and headaches. Pertinent negatives include no malaise/fatigue, peripheral edema or shortness of breath. Risk factors for coronary artery disease include dyslipidemia and sedentary lifestyle. The current treatment provides moderate improvement.  Anxiety Presents for follow-up visit. Symptoms include excessive worry, insomnia and nervous/anxious behavior. Patient reports no shortness of breath. Symptoms occur occasionally. The severity of symptoms is mild.    Depression        This is a chronic problem.  The current episode started more than 1 year ago.   The problem occurs intermittently.  Associated symptoms include insomnia and headaches.  Associated symptoms include no helplessness, no hopelessness and not sad.  Past medical history includes anxiety.   Anemia Presents for follow-up visit. There has been no malaise/fatigue.  Constipation This is a chronic problem. The current episode started more than 1 year ago. The problem has  been resolved since onset. Her stool frequency is 1 time per day. She has tried diet changes and laxatives for the symptoms. The treatment provided moderate relief.  Insomnia Primary symptoms: sleep disturbance, difficulty falling asleep, no malaise/fatigue.   The current episode started more than one year. The onset quality is gradual. Past treatments include meditation. The treatment provided moderate relief. PMH includes: depression.   Hyperlipidemia This is a chronic problem. The current episode started more than 1 year ago. The problem is uncontrolled. Recent lipid tests were reviewed and are high. Pertinent negatives include no shortness of breath. Current antihyperlipidemic treatment includes statins. The current treatment provides moderate improvement of lipids. Risk factors for coronary artery disease include dyslipidemia, hypertension and a sedentary lifestyle.  Headache  This is a chronic problem. The current episode started more than 1 year ago. Episode frequency: once a month. Associated symptoms include insomnia. Her past medical history is significant for hypertension.  Urinary Frequency  This is a chronic problem. The current episode started more than 1 year ago. The problem occurs intermittently. The patient is experiencing no pain. Associated symptoms include frequency. The treatment provided moderate relief.      Review of Systems  Constitutional:  Negative for malaise/fatigue.  Respiratory:  Negative for shortness of breath.   Gastrointestinal:  Positive for constipation.  Genitourinary:  Positive for frequency.  Musculoskeletal:  Positive for stiffness.  Neurological:  Positive for headaches.  Psychiatric/Behavioral:  Positive for depression and sleep disturbance. The patient is nervous/anxious and has insomnia.   All other systems reviewed and are negative.  Objective:   Physical Exam Vitals reviewed.  Constitutional:      General: She is not in acute  distress.    Appearance: She is well-developed.  HENT:     Head: Normocephalic and atraumatic.     Right Ear: Tympanic membrane normal.     Left Ear: Tympanic membrane normal.  Eyes:     Pupils: Pupils are equal, round, and reactive to light.  Neck:     Thyroid : No thyromegaly.  Cardiovascular:     Rate and Rhythm: Normal rate and regular rhythm.     Heart sounds: Normal heart sounds. No murmur heard. Pulmonary:     Effort: Pulmonary effort is normal. No respiratory distress.     Breath sounds: Normal breath sounds. No wheezing.  Abdominal:     General: Bowel sounds are normal. There is no distension.     Palpations: Abdomen is soft.     Tenderness: There is no abdominal tenderness.  Musculoskeletal:        General: No tenderness. Normal range of motion.     Cervical back: Normal range of motion and neck supple.  Skin:    General: Skin is warm and dry.  Neurological:     Mental Status: She is alert and oriented to person, place, and time.     Cranial Nerves: No cranial nerve deficit.     Deep Tendon Reflexes: Reflexes are normal and symmetric.  Psychiatric:        Behavior: Behavior normal.        Thought Content: Thought content normal.        Judgment: Judgment normal.       BP 124/79   Pulse 70   Temp (!) 97.5 F (36.4 C) (Temporal)   Ht 4\' 9"  (1.448 m)   Wt 112 lb 6.4 oz (51 kg)   SpO2 97%   BMI 24.32 kg/m      Assessment & Plan:   Tracy Jensen comes in today with chief complaint of Medical Management of Chronic Issues   Diagnosis and orders addressed:  1. Tension headache - butalbital -acetaminophen -caffeine  (FIORICET) 50-325-40 MG tablet; TAKE 1 TABLET BY MOUTH EVERY 6 HOURS AS NEEDED FOR HEADACHE  Dispense: 14 tablet; Refill: 1 - CMP14+EGFR - CBC with Differential/Platelet  2. Seasonal allergic rhinitis due to pollen - levocetirizine (XYZAL ) 5 MG tablet; Take 1 tablet (5 mg total) by mouth every evening.  Dispense: 90 tablet; Refill: 1 -  CMP14+EGFR - CBC with Differential/Platelet  3. HTN (hypertension), benign - lisinopril  (ZESTRIL ) 20 MG tablet; Take 1 tablet (20 mg total) by mouth daily. (NEEDS TO BE SEEN BEFORE NEXT REFILL)  Dispense: 90 tablet; Refill: 3 - CMP14+EGFR - CBC with Differential/Platelet - TSH  4. Primary osteoarthritis involving multiple joints - meloxicam  (MOBIC ) 7.5 MG tablet; Take 1 tablet (7.5 mg total) by mouth daily.  Dispense: 30 tablet; Refill: 0 - CMP14+EGFR - CBC with Differential/Platelet  5. Bursitis of left hip, unspecified bursa - meloxicam  (MOBIC ) 7.5 MG tablet; Take 1 tablet (7.5 mg total) by mouth daily.  Dispense: 30 tablet; Refill: 0 - CMP14+EGFR - CBC with Differential/Platelet  6. Hyperlipidemia, unspecified hyperlipidemia type - rosuvastatin  (CRESTOR ) 5 MG tablet; Take 1 tablet (5 mg total) by mouth every other day.  Dispense: 45 tablet; Refill: 3 - CMP14+EGFR - CBC with Differential/Platelet  7. Insomnia, unspecified type - traZODone  (DESYREL ) 50 MG tablet; Take 3 tablets (150 mg total) by mouth at bedtime.  Dispense: 90 tablet; Refill: 0 - CMP14+EGFR -  CBC with Differential/Platelet  8. Annual physical exam (Primary) - CMP14+EGFR - CBC with Differential/Platelet - Lipid panel - TSH  9. History of CVA (cerebrovascular accident) - CMP14+EGFR - CBC with Differential/Platelet  10. Current mild episode of major depressive disorder without prior episode (HCC) - CMP14+EGFR - CBC with Differential/Platelet  11. GAD (generalized anxiety disorder) - CMP14+EGFR - CBC with Differential/Platelet  12. Constipation, unspecified constipation type - CMP14+EGFR - CBC with Differential/Platelet  13. Deficiency anemia - CMP14+EGFR - CBC with Differential/Platelet  14. Overactive bladder  - CMP14+EGFR - CBC with Differential/Platelet  15. Colon cancer screening - Ambulatory referral to Gastroenterology - CMP14+EGFR - CBC with Differential/Platelet   Labs  pending Continue current medications  Health Maintenance reviewed Diet and exercise encouraged  Follow up plan: 6 months    Tommas Fragmin, FNP

## 2023-10-12 NOTE — Patient Instructions (Signed)
 Health Maintenance After Age 69 After age 4, you are at a higher risk for certain long-term diseases and infections as well as injuries from falls. Falls are a major cause of broken bones and head injuries in people who are older than age 47. Getting regular preventive care can help to keep you healthy and well. Preventive care includes getting regular testing and making lifestyle changes as recommended by your health care provider. Talk with your health care provider about: Which screenings and tests you should have. A screening is a test that checks for a disease when you have no symptoms. A diet and exercise plan that is right for you. What should I know about screenings and tests to prevent falls? Screening and testing are the best ways to find a health problem early. Early diagnosis and treatment give you the best chance of managing medical conditions that are common after age 37. Certain conditions and lifestyle choices may make you more likely to have a fall. Your health care provider may recommend: Regular vision checks. Poor vision and conditions such as cataracts can make you more likely to have a fall. If you wear glasses, make sure to get your prescription updated if your vision changes. Medicine review. Work with your health care provider to regularly review all of the medicines you are taking, including over-the-counter medicines. Ask your health care provider about any side effects that may make you more likely to have a fall. Tell your health care provider if any medicines that you take make you feel dizzy or sleepy. Strength and balance checks. Your health care provider may recommend certain tests to check your strength and balance while standing, walking, or changing positions. Foot health exam. Foot pain and numbness, as well as not wearing proper footwear, can make you more likely to have a fall. Screenings, including: Osteoporosis screening. Osteoporosis is a condition that causes  the bones to get weaker and break more easily. Blood pressure screening. Blood pressure changes and medicines to control blood pressure can make you feel dizzy. Depression screening. You may be more likely to have a fall if you have a fear of falling, feel depressed, or feel unable to do activities that you used to do. Alcohol use screening. Using too much alcohol can affect your balance and may make you more likely to have a fall. Follow these instructions at home: Lifestyle Do not drink alcohol if: Your health care provider tells you not to drink. If you drink alcohol: Limit how much you have to: 0-1 drink a day for women. 0-2 drinks a day for men. Know how much alcohol is in your drink. In the [place] one drink equals one 12 oz bottle of beer (355 mL), one 5 oz glass of wine (148 mL), or one 1 oz glass of hard liquor (44 mL). Do not use any products that contain nicotine or tobacco. These products include cigarettes, chewing tobacco, and vaping devices, such as e-cigarettes. If you need help quitting, ask your health care provider. Activity  Follow a regular exercise program to stay fit. This will help you maintain your balance. Ask your health care provider what types of exercise are appropriate for you. If you need a cane or walker, use it as recommended by your health care provider. Wear supportive shoes that have nonskid soles. Safety  Remove any tripping hazards, such as rugs, cords, and clutter. Install safety equipment such as grab bars in bathrooms and safety rails on stairs. Keep rooms and walkways  well-lit. General instructions Talk with your health care provider about your risks for falling. Tell your health care provider if: You fall. Be sure to tell your health care provider about all falls, even ones that seem minor. You feel dizzy, tiredness (fatigue), or off-balance. Take over-the-counter and prescription medicines only as told by your health care provider. These include  supplements. Eat a healthy diet and maintain a healthy weight. A healthy diet includes low-fat dairy products, low-fat (lean) meats, and fiber from whole grains, beans, and lots of fruits and vegetables. Stay current with your vaccines. Schedule regular health, dental, and eye exams. Summary Having a healthy lifestyle and getting preventive care can help to protect your health and wellness after age 11. Screening and testing are the best way to find a health problem early and help you avoid having a fall. Early diagnosis and treatment give you the best chance for managing medical conditions that are more common for people who are older than age 28. Falls are a major cause of broken bones and head injuries in people who are older than age 48. Take precautions to prevent a fall at home. Work with your health care provider to learn what changes you can make to improve your health and wellness and to prevent falls. This information is not intended to replace advice given to you by your health care provider. Make sure you discuss any questions you have with your health care provider. Document Revised: 10/22/2020 Document Reviewed: 10/22/2020 Elsevier Patient Education  2024 [employer].

## 2023-10-13 LAB — CBC WITH DIFFERENTIAL/PLATELET
Basophils Absolute: 0 10*3/uL (ref 0.0–0.2)
Basos: 1 %
EOS (ABSOLUTE): 0.1 10*3/uL (ref 0.0–0.4)
Eos: 3 %
Hematocrit: 39.5 % (ref 34.0–46.6)
Hemoglobin: 13 g/dL (ref 11.1–15.9)
Immature Grans (Abs): 0 10*3/uL (ref 0.0–0.1)
Immature Granulocytes: 0 %
Lymphocytes Absolute: 1.2 10*3/uL (ref 0.7–3.1)
Lymphs: 26 %
MCH: 30.9 pg (ref 26.6–33.0)
MCHC: 32.9 g/dL (ref 31.5–35.7)
MCV: 94 fL (ref 79–97)
Monocytes Absolute: 0.4 10*3/uL (ref 0.1–0.9)
Monocytes: 9 %
Neutrophils Absolute: 2.8 10*3/uL (ref 1.4–7.0)
Neutrophils: 61 %
Platelets: 205 10*3/uL (ref 150–450)
RBC: 4.21 x10E6/uL (ref 3.77–5.28)
RDW: 11.8 % (ref 11.7–15.4)
WBC: 4.5 10*3/uL (ref 3.4–10.8)

## 2023-10-13 LAB — LIPID PANEL
Cholesterol, Total: 176 mg/dL (ref 100–199)
HDL: 108 mg/dL (ref >39)
LDL CALC COMMENT:: 1.6 ratio (ref 0.0–4.4)
LDL Chol Calc (NIH): 54 mg/dL (ref 0–99)
Triglycerides: 75 mg/dL (ref 0–149)
VLDL Cholesterol Cal: 14 mg/dL (ref 5–40)

## 2023-10-13 LAB — TSH: TSH: 1.18 u[IU]/mL (ref 0.450–4.500)

## 2023-10-13 LAB — CMP14+EGFR
ALT: 12 IU/L (ref 0–32)
AST: 18 IU/L (ref 0–40)
Albumin: 4.6 g/dL (ref 3.9–4.9)
Alkaline Phosphatase: 59 IU/L (ref 44–121)
BUN/Creatinine Ratio: 20 (ref 12–28)
BUN: 14 mg/dL (ref 8–27)
Bilirubin Total: 0.2 mg/dL (ref 0.0–1.2)
CO2: 26 mmol/L (ref 20–29)
Calcium: 10.2 mg/dL (ref 8.7–10.3)
Chloride: 103 mmol/L (ref 96–106)
Creatinine, Ser: 0.7 mg/dL (ref 0.57–1.00)
Globulin, Total: 2.1 g/dL (ref 1.5–4.5)
Glucose: 93 mg/dL (ref 70–99)
Potassium: 4.6 mmol/L (ref 3.5–5.2)
Sodium: 143 mmol/L (ref 134–144)
Total Protein: 6.7 g/dL (ref 6.0–8.5)
eGFR: 94 mL/min/{1.73_m2} (ref >59)

## 2023-11-09 ENCOUNTER — Other Ambulatory Visit: Payer: Self-pay | Admitting: Family

## 2023-11-09 DIAGNOSIS — M150 Primary generalized (osteo)arthritis: Secondary | ICD-10-CM

## 2023-11-09 DIAGNOSIS — M7072 Other bursitis of hip, left hip: Secondary | ICD-10-CM

## 2023-11-25 ENCOUNTER — Ambulatory Visit (AMBULATORY_SURGERY_CENTER)

## 2023-11-25 ENCOUNTER — Encounter: Payer: Self-pay | Admitting: Gastroenterology

## 2023-11-25 VITALS — Ht <= 58 in | Wt 112.0 lb

## 2023-11-25 DIAGNOSIS — Z1211 Encounter for screening for malignant neoplasm of colon: Secondary | ICD-10-CM

## 2023-11-25 MED ORDER — NA SULFATE-K SULFATE-MG SULF 17.5-3.13-1.6 GM/177ML PO SOLN: 1.0000 | Freq: Once | ORAL | 0 refills | Status: AC

## 2023-11-25 NOTE — Progress Notes (Signed)

## 2023-12-06 ENCOUNTER — Other Ambulatory Visit: Payer: Self-pay | Admitting: Family

## 2023-12-06 DIAGNOSIS — G4700 Insomnia, unspecified: Secondary | ICD-10-CM

## 2023-12-09 ENCOUNTER — Ambulatory Visit: Admitting: Gastroenterology

## 2023-12-09 ENCOUNTER — Encounter: Payer: Self-pay | Admitting: Gastroenterology

## 2023-12-09 VITALS — BP 106/63 | HR 68 | Temp 97.9°F | Resp 16 | Ht <= 58 in | Wt 112.0 lb

## 2023-12-09 DIAGNOSIS — K5730 Diverticulosis of large intestine without perforation or abscess without bleeding: Secondary | ICD-10-CM | POA: Diagnosis not present

## 2023-12-09 DIAGNOSIS — D120 Benign neoplasm of cecum: Secondary | ICD-10-CM

## 2023-12-09 DIAGNOSIS — D122 Benign neoplasm of ascending colon: Secondary | ICD-10-CM | POA: Diagnosis not present

## 2023-12-09 DIAGNOSIS — Z1211 Encounter for screening for malignant neoplasm of colon: Secondary | ICD-10-CM | POA: Diagnosis not present

## 2023-12-09 DIAGNOSIS — I10 Essential (primary) hypertension: Secondary | ICD-10-CM | POA: Diagnosis not present

## 2023-12-09 DIAGNOSIS — K640 First degree hemorrhoids: Secondary | ICD-10-CM

## 2023-12-09 MED ORDER — SODIUM CHLORIDE 0.9 % IV SOLN: 500.0000 mL | Freq: Once | INTRAVENOUS | Status: DC

## 2023-12-09 NOTE — Op Note (Signed)
 [facility] Patient Name: Tracy Jensen Procedure Date: 12/09/2023 9:32 AM MRN: [id number] Endoscopist: Glendia E. Stacia , MD, [id number] Age: 68 Referring MD:  Date of Birth: 03/21/1956 Gender: Female Account #: [id number] Procedure:                Colonoscopy Indications:              Screening for colorectal malignant neoplasm (last                            colonoscopy was 10 years ago) Medicines:                Monitored Anesthesia Care Procedure:                Pre-Anesthesia Assessment:                           - Prior to the procedure, a History and Physical                            was performed, and patient medications and                            allergies were reviewed. The patient's tolerance of                            previous anesthesia was also reviewed. The risks                            and benefits of the procedure and the sedation                            options and risks were discussed with the patient.                            All questions were answered, and informed consent                            was obtained. Prior Anticoagulants: The patient has                            taken no anticoagulant or antiplatelet agents. ASA                            Grade Assessment: III - A patient with severe                            systemic disease. After reviewing the risks and                            benefits, the patient was deemed in satisfactory                            condition to undergo the procedure.  After obtaining informed consent, the colonoscope                            was passed under direct vision. Throughout the                            procedure, the patient's blood pressure, pulse, and                            oxygen saturations were monitored continuously. The                            Olympus Scope SN: [id number] was introduced through                            the anus and advanced  to the the terminal ileum,                            with identification of the appendiceal orifice and                            IC valve. The colonoscopy was technically difficult                            and complex due to restricted mobility of the colon                            and a tortuous colon. Successful completion of the                            procedure was aided by changing the patient to a                            supine position and using manual pressure. The                            patient tolerated the procedure well. The quality                            of the bowel preparation was adequate. The terminal                            ileum, ileocecal valve, appendiceal orifice, and                            rectum were photographed. The bowel preparation                            used was SUPREP via split dose instruction. Scope In: 9:42:23 AM Scope Out: 10:16:10 AM Scope Withdrawal Time: 0 hours 24 minutes 8 seconds  Total Procedure Duration: 0 hours 33 minutes 47 seconds  Findings:  The perianal and digital rectal examinations were                            normal. Pertinent negatives include normal                            sphincter tone and no palpable rectal lesions.                           A 7 mm polyp was found in the ascending colon. The                            polyp was sessile. The polyp was removed with a                            cold snare. Resection and retrieval were complete.                            Estimated blood loss was minimal.                           A 3 mm polyp was found in the cecum. The polyp was                            sessile. The polyp was removed with a cold snare.                            Resection and retrieval were complete. Estimated                            blood loss was minimal.                           Many medium-mouthed and small-mouthed diverticula                             were found in the sigmoid colon and descending                            colon.                           The exam was otherwise normal throughout the                            examined colon. The cecum was very difficult to                            intubate due to it being in a fixed, angulated                            position relative to the ICV. Intubation was only  achieved after supine positioning and very slowly                            inching the colonoscope past the ICV                           The terminal ileum appeared normal.                           Non-bleeding internal hemorrhoids were found during                            retroflexion. The hemorrhoids were Grade I                            (internal hemorrhoids that do not prolapse).                           No additional abnormalities were found on                            retroflexion. Complications:            No immediate complications. Estimated Blood Loss:     Estimated blood loss was minimal. Impression:               - One 7 mm polyp in the ascending colon, removed                            with a cold snare. Resected and retrieved.                           - One 3 mm polyp in the cecum, removed with a cold                            snare. Resected and retrieved.                           - Moderate diverticulosis in the sigmoid colon and                            in the descending colon.                           - The examined portion of the ileum was normal.                           - Non-bleeding internal hemorrhoids.                           - Difficult cecal intubation secondary to fixed                            angulation position relative to ICV. Query surgical  history/adhesions Recommendation:           - Patient has a contact number available for                            emergencies. The signs and symptoms of potential                             delayed complications were discussed with the                            patient. Return to normal activities tomorrow.                            Written discharge instructions were provided to the                            patient.                           - Resume previous diet.                           - Continue present medications.                           - Await pathology results.                           - Repeat colonoscopy (date not yet determined) for                            surveillance based on pathology results. Asah Lamay E. Stacia, MD 12/09/2023 10:31:11 AM This report has been signed electronically.

## 2023-12-09 NOTE — Progress Notes (Signed)
 [facility] History and Physical   Primary Care Physician:  Lavell Bari LABOR, FNP   Reason for Procedure:   Colon cancer screening  Plan:    Screening colonosocpy     HPI: Tracy Jensen is a 68 y.o. female undergoing average risk screening colonoscopy.  She has no family history of colon cancer and no chronic GI symptoms.  She had a colonoscopy in 2015 with no polyps in [place], [place].   Past Medical History:  Diagnosis Date   Anemia    Anxiety    Arthritis    Esophageal stricture    Gastric ulcer    GI bleed    Hiatal hernia    Hypertension    Insomnia    Migraines    Sciatica    TIA (transient ischemic attack)     Past Surgical History:  Procedure Laterality Date   CAROTID ARTERY ANGIOPLASTY  09/2000   CATARACT EXTRACTION, BILATERAL Bilateral    CESAREAN SECTION     1991, 1984   COLONOSCOPY     ROTATOR CUFF REPAIR Right 2020   UPPER GASTROINTESTINAL ENDOSCOPY  11/01/2021   Dr.Beavers    Prior to Admission medications   Medication Sig Start Date End Date Taking? Authorizing Provider  alendronate  (FOSAMAX ) 70 MG tablet Take 1 tablet (70 mg total) by mouth every 7 (seven) days. Take with a full glass of water on an empty stomach. 10/12/23 10/11/24 Yes Hawks, Christy A, FNP  aspirin EC 81 MG tablet Take 81 mg by mouth at bedtime. Swallow whole.   Yes [provider]  busPIRone  (BUSPAR ) 15 MG tablet Take 1 tablet (15 mg total) by mouth 3 (three) times daily. 04/13/23  Yes Hawks, Christy A, FNP  Calcium  Carb-Cholecalciferol (CALCIUM  1000 + D PO) Take by mouth.   Yes [provider]  cholecalciferol (VITAMIN D3) 25 MCG (1000 UNIT) tablet Take 8,000 Units by mouth daily.   Yes [provider]  Cyanocobalamin  (VITAMIN B-12 PO) Take by mouth.   Yes [provider]  cyclobenzaprine  (FLEXERIL ) 10 MG tablet Take 1 tablet (10 mg total) by mouth 3 (three) times daily. 10/12/23  Yes Hawks, Christy A, FNP  fluticasone  (FLONASE ) 50 MCG/ACT  nasal spray Place 2 sprays into both nostrils daily. 09/04/22  Yes Hawks, Christy A, FNP  levocetirizine (XYZAL ) 5 MG tablet Take 1 tablet (5 mg total) by mouth every evening. 10/12/23  Yes Hawks, Christy A, FNP  lisinopril  (ZESTRIL ) 20 MG tablet Take 1 tablet (20 mg total) by mouth daily. (NEEDS TO BE SEEN BEFORE NEXT REFILL) 10/12/23  Yes Hawks, Christy A, FNP  oxybutynin  (DITROPAN  XL) 10 MG 24 hr tablet Take 1 tablet (10 mg total) by mouth at bedtime. 06/25/23  Yes Hawks, Christy A, FNP  polyethylene glycol (MIRALAX  / GLYCOLAX ) packet Take 17 g by mouth daily as needed for mild constipation or moderate constipation. 07/13/18  Yes Maczis, Michael M, PA-C  rosuvastatin  (CRESTOR ) 5 MG tablet Take 1 tablet (5 mg total) by mouth every other day. 10/12/23  Yes Hawks, Bari A, FNP  traZODone  (DESYREL ) 50 MG tablet TAKE 3 TABLETS BY MOUTH AT BEDTIME 12/07/23  Yes Hawks, [place] A, FNP  butalbital -acetaminophen -caffeine  (FIORICET) 50-325-40 MG tablet TAKE 1 TABLET BY MOUTH EVERY 6 HOURS AS NEEDED FOR HEADACHE 10/12/23   Lavell Bari A, FNP  meloxicam  (MOBIC ) 7.5 MG tablet Take 1 tablet by mouth once daily 11/10/23   Lavell Bari LABOR, FNP    Current Outpatient Medications  Medication Sig Dispense  Refill   alendronate  (FOSAMAX ) 70 MG tablet Take 1 tablet (70 mg total) by mouth every 7 (seven) days. Take with a full glass of water on an empty stomach. 12 tablet 1   aspirin EC 81 MG tablet Take 81 mg by mouth at bedtime. Swallow whole.     busPIRone  (BUSPAR ) 15 MG tablet Take 1 tablet (15 mg total) by mouth 3 (three) times daily. 270 tablet 3   Calcium  Carb-Cholecalciferol (CALCIUM  1000 + D PO) Take by mouth.     cholecalciferol (VITAMIN D3) 25 MCG (1000 UNIT) tablet Take 8,000 Units by mouth daily.     Cyanocobalamin  (VITAMIN B-12 PO) Take by mouth.     cyclobenzaprine  (FLEXERIL ) 10 MG tablet Take 1 tablet (10 mg total) by mouth 3 (three) times daily. 90 tablet 1   fluticasone  (FLONASE ) 50 MCG/ACT nasal spray  Place 2 sprays into both nostrils daily. 16 g 6   levocetirizine (XYZAL ) 5 MG tablet Take 1 tablet (5 mg total) by mouth every evening. 90 tablet 1   lisinopril  (ZESTRIL ) 20 MG tablet Take 1 tablet (20 mg total) by mouth daily. (NEEDS TO BE SEEN BEFORE NEXT REFILL) 90 tablet 3   oxybutynin  (DITROPAN  XL) 10 MG 24 hr tablet Take 1 tablet (10 mg total) by mouth at bedtime. 90 tablet 1   polyethylene glycol (MIRALAX  / GLYCOLAX ) packet Take 17 g by mouth daily as needed for mild constipation or moderate constipation. 14 each 0   rosuvastatin  (CRESTOR ) 5 MG tablet Take 1 tablet (5 mg total) by mouth every other day. 45 tablet 3   traZODone  (DESYREL ) 50 MG tablet TAKE 3 TABLETS BY MOUTH AT BEDTIME 90 tablet 1   butalbital -acetaminophen -caffeine  (FIORICET) 50-325-40 MG tablet TAKE 1 TABLET BY MOUTH EVERY 6 HOURS AS NEEDED FOR HEADACHE 14 tablet 1   meloxicam  (MOBIC ) 7.5 MG tablet Take 1 tablet by mouth once daily 30 tablet 0   Current Facility-Administered Medications  Medication Dose Route Frequency Provider Last Rate Last Admin   0.9 %  sodium chloride  infusion  500 mL Intravenous Once Stacia Glendia BRAVO, MD        Allergies as of 12/09/2023 - Review Complete 12/09/2023  Allergen Reaction Noted   Azithromycin Hives 04/02/2016   Omeprazole Other (See Comments) 10/31/2021   Statins Other (See Comments) 03/07/2022    Family History  Problem Relation Age of Onset   Diabetes Mother    Asthma Mother    Hypertension Mother    Hypertension Father    Heart disease Father    Colon cancer Neg Hx    Esophageal cancer Neg Hx    Stomach cancer Neg Hx    Breast cancer Neg Hx     Social History   Socioeconomic History   Marital status: Married    Spouse name: Not on file   Number of children: 2   Years of education: Not on file   Highest education level: Some college, no degree  Occupational History   Occupation: retired  Tobacco Use   Smoking status: Never   Smokeless tobacco: Never   Vaping Use   Vaping status: Never Used  Substance and Sexual Activity   Alcohol use: Yes    Comment: occ   Drug use: No   Sexual activity: Not on file  Other Topics Concern   Not on file  Social History Narrative   Not on file   Social Drivers of Health   Financial Resource Strain: Low Risk  (05/25/2023)  Overall Financial Resource Strain (CARDIA)    Difficulty of Paying Living Expenses: Not hard at all  Food Insecurity: No Food Insecurity (10/09/2023)   Hunger Vital Sign    Worried About Running Out of Food in the Last Year: Never true    Ran Out of Food in the Last Year: Never true  Transportation Needs: No Transportation Needs (10/09/2023)   PRAPARE - Administrator, Civil Service (Medical): No    Lack of Transportation (Non-Medical): No  Physical Activity: Inactive (10/09/2023)   Exercise Vital Sign    Days of Exercise per Week: 0 days    Minutes of Exercise per Session: 30 min  Stress: No Stress Concern Present (10/09/2023)   [employer] of Occupational Health - Occupational Stress Questionnaire    Feeling of Stress : Not at all  Social Connections: Socially Integrated (10/09/2023)   Social Connection and Isolation Panel    Frequency of Communication with Friends and Family: More than three times a week    Frequency of Social Gatherings with Friends and Family: Once a week    Attends Religious Services: More than 4 times per year    Active Member of [employer] or Organizations: Yes    Attends Engineer, structural: More than 4 times per year    Marital Status: Married  Catering manager Violence: Not At Risk (05/25/2023)   Humiliation, Afraid, Rape, and Kick questionnaire    Fear of Current or Ex-Partner: No    Emotionally Abused: No    Physically Abused: No    Sexually Abused: No    Review of Systems:  All other review of systems negative except as mentioned in the HPI.  Physical Exam: Vital signs BP 116/83   Pulse 80   Temp 97.9 F (36.6  C) (Temporal)   Ht 4' 9 (1.448 m)   Wt 112 lb (50.8 kg)   SpO2 100%   BMI 24.24 kg/m   General:   Alert,  Well-developed, well-nourished, pleasant and cooperative in NAD Airway:  Mallampati 2 Lungs:  Clear throughout to auscultation.   Heart:  Regular rate and rhythm; no murmurs, clicks, rubs,  or gallops. Abdomen:  Soft, nontender and nondistended. Normal bowel sounds.   Neuro/Psych:  Normal mood and affect. A and O x 3   Rawlin Reaume E. Stacia, MD [facility]

## 2023-12-09 NOTE — Progress Notes (Signed)
 Pt's states no medical or surgical changes since previsit or office visit.

## 2023-12-09 NOTE — Patient Instructions (Signed)

## 2023-12-09 NOTE — Progress Notes (Signed)
 A/o x 3, VSS, gd SR's, pleased with anesthesia, report to RN

## 2023-12-10 ENCOUNTER — Telehealth: Payer: Self-pay | Admitting: *Deleted

## 2023-12-10 NOTE — Telephone Encounter (Signed)
  Follow up Call-     12/09/2023    8:26 AM 01/09/2022    9:41 AM 01/09/2022    9:32 AM 11/01/2021    7:45 AM  Call back number  Post procedure Call Back phone  # [phone]  [phone]  Permission to leave phone message Yes No Yes No  comments  No messages       Patient questions:  Do you have a fever, pain , or abdominal swelling? No. Pain Score  0 *  Have you tolerated food without any problems? Yes.    Have you been able to return to your normal activities? Yes.    Do you have any questions about your discharge instructions: Diet   No. Medications  No. Follow up visit  No.  Do you have questions or concerns about your Care? No.  Actions: * If pain score is 4 or above: No action needed, pain <4.

## 2023-12-11 LAB — SURGICAL PATHOLOGY

## 2023-12-12 ENCOUNTER — Ambulatory Visit: Payer: Self-pay | Admitting: Gastroenterology

## 2023-12-12 NOTE — Progress Notes (Signed)
 Tracy Jensen,   The two polyps which I removed during your recent procedure were proven to be completely benign but are considered pre-cancerous polyps that MAY have grown into cancer if they had not been removed.  Studies shows that at least 20% of women over age 69 and 30% of men over age 16 have pre-cancerous polyps.  Based on current nationally recognized surveillance guidelines, it would be recommended that you have a repeat colonoscopy in 7 years.   However, because colon cancer screening after age 48 is done on a case-by-case basis, taking into account the patient's risk factors for colon cancer, as well as comorbidities and life expectancy, I would recommend you make an appointment with me in 7 years to discuss the risks/benefits of further colon cancer screening.   If you develop any new rectal bleeding, abdominal pain or significant bowel habit changes, please contact me before then.

## 2023-12-13 ENCOUNTER — Other Ambulatory Visit: Payer: Self-pay | Admitting: Family

## 2023-12-13 DIAGNOSIS — M7072 Other bursitis of hip, left hip: Secondary | ICD-10-CM

## 2023-12-13 DIAGNOSIS — M150 Primary generalized (osteo)arthritis: Secondary | ICD-10-CM

## 2023-12-19 ENCOUNTER — Other Ambulatory Visit: Payer: Self-pay | Admitting: Family

## 2023-12-21 ENCOUNTER — Other Ambulatory Visit: Payer: Self-pay | Admitting: Family

## 2023-12-28 ENCOUNTER — Encounter: Payer: Self-pay | Admitting: *Deleted

## 2024-02-04 ENCOUNTER — Other Ambulatory Visit: Payer: Self-pay | Admitting: Family

## 2024-02-04 DIAGNOSIS — G4700 Insomnia, unspecified: Secondary | ICD-10-CM

## 2024-02-29 ENCOUNTER — Encounter: Payer: Self-pay | Admitting: Family

## 2024-02-29 ENCOUNTER — Ambulatory Visit: Admitting: Family

## 2024-02-29 VITALS — BP 127/82 | HR 75 | Temp 97.9°F | Ht <= 58 in | Wt 111.6 lb

## 2024-02-29 DIAGNOSIS — D539 Nutritional anemia, unspecified: Secondary | ICD-10-CM

## 2024-02-29 DIAGNOSIS — J301 Allergic rhinitis due to pollen: Secondary | ICD-10-CM

## 2024-02-29 DIAGNOSIS — F320 Major depressive disorder, single episode, mild: Secondary | ICD-10-CM

## 2024-02-29 DIAGNOSIS — K5900 Constipation, unspecified: Secondary | ICD-10-CM

## 2024-02-29 DIAGNOSIS — G44209 Tension-type headache, unspecified, not intractable: Secondary | ICD-10-CM | POA: Diagnosis not present

## 2024-02-29 DIAGNOSIS — I10 Essential (primary) hypertension: Secondary | ICD-10-CM | POA: Diagnosis not present

## 2024-02-29 DIAGNOSIS — M150 Primary generalized (osteo)arthritis: Secondary | ICD-10-CM | POA: Diagnosis not present

## 2024-02-29 DIAGNOSIS — G4700 Insomnia, unspecified: Secondary | ICD-10-CM

## 2024-02-29 DIAGNOSIS — E7800 Pure hypercholesterolemia, unspecified: Secondary | ICD-10-CM

## 2024-02-29 DIAGNOSIS — F411 Generalized anxiety disorder: Secondary | ICD-10-CM | POA: Diagnosis not present

## 2024-02-29 DIAGNOSIS — N3281 Overactive bladder: Secondary | ICD-10-CM

## 2024-02-29 DIAGNOSIS — M810 Age-related osteoporosis without current pathological fracture: Secondary | ICD-10-CM | POA: Diagnosis not present

## 2024-02-29 DIAGNOSIS — Z23 Encounter for immunization: Secondary | ICD-10-CM | POA: Diagnosis not present

## 2024-02-29 LAB — CMP14+EGFR
ALT: 13 IU/L (ref 0–32)
AST: 19 IU/L (ref 0–40)
Albumin: 4.5 g/dL (ref 3.9–4.9)
Alkaline Phosphatase: 52 IU/L (ref 51–125)
BUN/Creatinine Ratio: 24 (ref 12–28)
BUN: 21 mg/dL (ref 8–27)
Bilirubin Total: <0.2 mg/dL (ref 0.0–1.2)
CO2: 24 mmol/L (ref 20–29)
Calcium: 9.8 mg/dL (ref 8.7–10.3)
Chloride: 104 mmol/L (ref 96–106)
Creatinine, Ser: 0.86 mg/dL (ref 0.57–1.00)
Globulin, Total: 1.8 g/dL (ref 1.5–4.5)
Glucose: 94 mg/dL (ref 70–99)
Potassium: 4.6 mmol/L (ref 3.5–5.2)
Sodium: 143 mmol/L (ref 134–144)
Total Protein: 6.3 g/dL (ref 6.0–8.5)
eGFR: 73 mL/min/1.73 (ref >59)

## 2024-02-29 MED ORDER — ALENDRONATE SODIUM 70 MG PO TABS
70.0000 mg | ORAL_TABLET | ORAL | 1 refills | Status: AC
Start: 2024-02-29 — End: 2025-02-28

## 2024-02-29 MED ORDER — BUSPIRONE HCL 15 MG PO TABS: 15.0000 mg | ORAL_TABLET | Freq: Three times a day (TID) | ORAL | 3 refills | Status: AC

## 2024-02-29 MED ORDER — CYCLOBENZAPRINE HCL 10 MG PO TABS: 10.0000 mg | ORAL_TABLET | Freq: Three times a day (TID) | ORAL | 1 refills | Status: AC

## 2024-02-29 MED ORDER — BUTALBITAL-APAP-CAFFEINE 50-325-40 MG PO TABS: ORAL_TABLET | ORAL | 1 refills | Status: AC

## 2024-02-29 MED ORDER — OXYBUTYNIN CHLORIDE ER 10 MG PO TB24: 10.0000 mg | ORAL_TABLET | Freq: Every day | ORAL | 2 refills | Status: AC

## 2024-02-29 MED ORDER — TRAZODONE HCL 50 MG PO TABS: 150.0000 mg | ORAL_TABLET | Freq: Every day | ORAL | 2 refills | Status: DC

## 2024-02-29 MED ORDER — LEVOCETIRIZINE DIHYDROCHLORIDE 5 MG PO TABS: 5.0000 mg | ORAL_TABLET | Freq: Every evening | ORAL | 1 refills | Status: AC

## 2024-02-29 NOTE — Patient Instructions (Signed)
 Health Maintenance After Age 69 After age 27, you are at a higher risk for certain long-term diseases and infections as well as injuries from falls. Falls are a major cause of broken bones and head injuries in people who are older than age 73. Getting regular preventive care can help to keep you healthy and well. Preventive care includes getting regular testing and making lifestyle changes as recommended by your health care provider. Talk with your health care provider about: Which screenings and tests you should have. A screening is a test that checks for a disease when you have no symptoms. A diet and exercise plan that is right for you. What should I know about screenings and tests to prevent falls? Screening and testing are the best ways to find a health problem early. Early diagnosis and treatment give you the best chance of managing medical conditions that are common after age 90. Certain conditions and lifestyle choices may make you more likely to have a fall. Your health care provider may recommend: Regular vision checks. Poor vision and conditions such as cataracts can make you more likely to have a fall. If you wear glasses, make sure to get your prescription updated if your vision changes. Medicine review. Work with your health care provider to regularly review all of the medicines you are taking, including over-the-counter medicines. Ask your health care provider about any side effects that may make you more likely to have a fall. Tell your health care provider if any medicines that you take make you feel dizzy or sleepy. Strength and balance checks. Your health care provider may recommend certain tests to check your strength and balance while standing, walking, or changing positions. Foot health exam. Foot pain and numbness, as well as not wearing proper footwear, can make you more likely to have a fall. Screenings, including: Osteoporosis screening. Osteoporosis is a condition that causes  the bones to get weaker and break more easily. Blood pressure screening. Blood pressure changes and medicines to control blood pressure can make you feel dizzy. Depression screening. You may be more likely to have a fall if you have a fear of falling, feel depressed, or feel unable to do activities that you used to do. Alcohol  use screening. Using too much alcohol  can affect your balance and may make you more likely to have a fall. Follow these instructions at home: Lifestyle Do not drink alcohol  if: Your health care provider tells you not to drink. If you drink alcohol : Limit how much you have to: 0-1 drink a day for women. 0-2 drinks a day for men. Know how much alcohol  is in your drink. In the [place] one drink equals one 12 oz bottle of beer (355 mL), one 5 oz glass of wine (148 mL), or one 1 oz glass of hard liquor (44 mL). Do not use any products that contain nicotine or tobacco. These products include cigarettes, chewing tobacco, and vaping devices, such as e-cigarettes. If you need help quitting, ask your health care provider. Activity  Follow a regular exercise program to stay fit. This will help you maintain your balance. Ask your health care provider what types of exercise are appropriate for you. If you need a cane or walker, use it as recommended by your health care provider. Wear supportive shoes that have nonskid soles. Safety  Remove any tripping hazards, such as rugs, cords, and clutter. Install safety equipment such as grab bars in bathrooms and safety rails on stairs. Keep rooms and walkways  well-lit. General instructions Talk with your health care provider about your risks for falling. Tell your health care provider if: You fall. Be sure to tell your health care provider about all falls, even ones that seem minor. You feel dizzy, tiredness (fatigue), or off-balance. Take over-the-counter and prescription medicines only as told by your health care provider. These include  supplements. Eat a healthy diet and maintain a healthy weight. A healthy diet includes low-fat dairy products, low-fat (lean) meats, and fiber from whole grains, beans, and lots of fruits and vegetables. Stay current with your vaccines. Schedule regular health, dental, and eye exams. Summary Having a healthy lifestyle and getting preventive care can help to protect your health and wellness after age 15. Screening and testing are the best way to find a health problem early and help you avoid having a fall. Early diagnosis and treatment give you the best chance for managing medical conditions that are more common for people who are older than age 42. Falls are a major cause of broken bones and head injuries in people who are older than age 64. Take precautions to prevent a fall at home. Work with your health care provider to learn what changes you can make to improve your health and wellness and to prevent falls. This information is not intended to replace advice given to you by your health care provider. Make sure you discuss any questions you have with your health care provider. Document Revised: 10/22/2020 Document Reviewed: 10/22/2020 Elsevier Patient Education  2024 [employer].

## 2024-02-29 NOTE — Progress Notes (Signed)
 Subjective:    Patient ID: Tracy Jensen, female    DOB: [birth year], 67 y.o.   MRN: [id number]  Chief Complaint  Patient presents with   Medical Management of Chronic Issues    Pt presents to the office today for chronic follow up.   She has her grandson every other week.    She has osteoporosis and takes fosamax  weekly. Takes Vit D and calcium  daily. Last dexa scan was 03/06/21.   She is followed by Ortho as needed for left hip pain and just got steroid injection with mild relief.    Pt has osteoporosis and taking Fosamax  weekly. Dexa scan 06/24/23.   Complaining increased allergies. Started taking OTC zyrtec with mild relief.  Arthritis Presents for follow-up visit. She complains of pain and stiffness. Affected locations include the right shoulder, left shoulder, left hip and right hip. Her pain is at a severity of 5/10.  Hypertension This is a chronic problem. The current episode started more than 1 year ago. The problem has been resolved since onset. The problem is controlled. Associated symptoms include anxiety and headaches. Pertinent negatives include no malaise/fatigue, peripheral edema or shortness of breath. Risk factors for coronary artery disease include dyslipidemia and sedentary lifestyle. Past treatments include ACE inhibitors. The current treatment provides moderate improvement. There is no history of chronic renal disease.  Anxiety Presents for follow-up visit. Symptoms include excessive worry, insomnia and nervous/anxious behavior. Patient reports no shortness of breath. Symptoms occur occasionally. The severity of symptoms is mild.    Depression        This is a chronic problem.  The current episode started more than 1 year ago.   The problem occurs intermittently.  Associated symptoms include insomnia and headaches.  Associated symptoms include no helplessness, no hopelessness and not sad.  Past medical history includes anxiety.   Anemia Presents for follow-up  visit. There has been no malaise/fatigue. There is no history of chronic renal disease.  Constipation This is a chronic problem. The current episode started more than 1 year ago. The problem has been resolved since onset. Her stool frequency is 1 time per day. She has tried diet changes and laxatives for the symptoms. The treatment provided moderate relief.  Insomnia Primary symptoms: sleep disturbance, difficulty falling asleep, no malaise/fatigue.   The current episode started more than one year. The onset quality is gradual. Past treatments include meditation. The treatment provided moderate relief. PMH includes: depression.   Hyperlipidemia This is a chronic problem. The current episode started more than 1 year ago. The problem is controlled. Recent lipid tests were reviewed and are normal. She has no history of chronic renal disease. Pertinent negatives include no shortness of breath. Current antihyperlipidemic treatment includes statins. The current treatment provides moderate improvement of lipids. Risk factors for coronary artery disease include dyslipidemia, hypertension and a sedentary lifestyle.  Headache  This is a chronic problem. The current episode started more than 1 year ago. Episode frequency: 3 a month. The pain is located in the Right unilateral region. The pain quality is similar to prior headaches. Associated symptoms include insomnia. She has tried triptans for the symptoms. The treatment provided moderate relief. Her past medical history is significant for hypertension.  Urinary Frequency  This is a chronic problem. The current episode started more than 1 year ago. The problem occurs intermittently. The patient is experiencing no pain. Associated symptoms include frequency. The treatment provided moderate relief.      Review of Systems  Constitutional:  Negative for malaise/fatigue.  Respiratory:  Negative for shortness of breath.   Gastrointestinal:  Positive for  constipation.  Genitourinary:  Positive for frequency.  Musculoskeletal:  Positive for stiffness.  Neurological:  Positive for headaches.  Psychiatric/Behavioral:  Positive for depression and sleep disturbance. The patient is nervous/anxious and has insomnia.   All other systems reviewed and are negative.  Family History  Problem Relation Age of Onset   Diabetes Mother    Asthma Mother    Hypertension Mother    Hypertension Father    Heart disease Father    Colon cancer Neg Hx    Esophageal cancer Neg Hx    Stomach cancer Neg Hx    Breast cancer Neg Hx    Social History   Socioeconomic History   Marital status: Married    Spouse name: Not on file   Number of children: 2   Years of education: Not on file   Highest education level: Some college, no degree  Occupational History   Occupation: retired  Tobacco Use   Smoking status: Never   Smokeless tobacco: Never  Vaping Use   Vaping status: Never Used  Substance and Sexual Activity   Alcohol use: Yes    Comment: occ   Drug use: No   Sexual activity: Not on file  Other Topics Concern   Not on file  Social History Narrative   Not on file   Social Drivers of Health   Financial Resource Strain: Low Risk  (05/25/2023)   Overall Financial Resource Strain (CARDIA)    Difficulty of Paying Living Expenses: Not hard at all  Food Insecurity: No Food Insecurity (10/09/2023)   Hunger Vital Sign    Worried About Running Out of Food in the Last Year: Never true    Ran Out of Food in the Last Year: Never true  Transportation Needs: No Transportation Needs (10/09/2023)   PRAPARE - Administrator, Civil Service (Medical): No    Lack of Transportation (Non-Medical): No  Physical Activity: Inactive (10/09/2023)   Exercise Vital Sign    Days of Exercise per Week: 0 days    Minutes of Exercise per Session: 30 min  Stress: No Stress Concern Present (10/09/2023)   [employer] of Occupational Health - Occupational Stress  Questionnaire    Feeling of Stress : Not at all  Social Connections: Socially Integrated (10/09/2023)   Social Connection and Isolation Panel    Frequency of Communication with Friends and Family: More than three times a week    Frequency of Social Gatherings with Friends and Family: Once a week    Attends Religious Services: More than 4 times per year    Active Member of [employer] or Organizations: Yes    Attends Engineer, structural: More than 4 times per year    Marital Status: Married       Objective:   Physical Exam Vitals reviewed.  Constitutional:      General: She is not in acute distress.    Appearance: She is well-developed.  HENT:     Head: Normocephalic and atraumatic.     Right Ear: Tympanic membrane normal.     Left Ear: Tympanic membrane normal.  Eyes:     Pupils: Pupils are equal, round, and reactive to light.  Neck:     Thyroid : No thyromegaly.  Cardiovascular:     Rate and Rhythm: Normal rate and regular rhythm.     Heart sounds: Normal heart sounds.  No murmur heard. Pulmonary:     Effort: Pulmonary effort is normal. No respiratory distress.     Breath sounds: Normal breath sounds. No wheezing.  Abdominal:     General: Bowel sounds are normal. There is no distension.     Palpations: Abdomen is soft.     Tenderness: There is no abdominal tenderness.  Musculoskeletal:        General: No tenderness. Normal range of motion.     Cervical back: Normal range of motion and neck supple.  Skin:    General: Skin is warm and dry.  Neurological:     Mental Status: She is alert and oriented to person, place, and time.     Cranial Nerves: No cranial nerve deficit.     Deep Tendon Reflexes: Reflexes are normal and symmetric.  Psychiatric:        Behavior: Behavior normal.        Thought Content: Thought content normal.        Judgment: Judgment normal.       BP 127/82   Pulse 75   Temp 97.9 F (36.6 C)   Ht 4' 9 (1.448 m)   Wt 111 lb 9.6 oz (50.6 kg)    SpO2 100%   BMI 24.15 kg/m      Assessment & Plan:   Tracy Jensen comes in today with chief complaint of Medical Management of Chronic Issues   Diagnosis and orders addressed:  1. GAD (generalized anxiety disorder) - busPIRone  (BUSPAR ) 15 MG tablet; Take 1 tablet (15 mg total) by mouth 3 (three) times daily.  Dispense: 270 tablet; Refill: 3 - CMP14+EGFR  2. Tension headache - butalbital -acetaminophen -caffeine  (FIORICET) 50-325-40 MG tablet; TAKE 1 TABLET BY MOUTH EVERY 6 HOURS AS NEEDED FOR HEADACHE  Dispense: 20 tablet; Refill: 1 - CMP14+EGFR  3. Seasonal allergic rhinitis due to pollen - levocetirizine (XYZAL ) 5 MG tablet; Take 1 tablet (5 mg total) by mouth every evening.  Dispense: 90 tablet; Refill: 1 - CMP14+EGFR  4. Insomnia, unspecified type - traZODone  (DESYREL ) 50 MG tablet; Take 3 tablets (150 mg total) by mouth at bedtime.  Dispense: 90 tablet; Refill: 2 - CMP14+EGFR  5. HTN (hypertension), benign (Primary)  - CMP14+EGFR  6. Current mild episode of major depressive disorder without prior episode (HCC) - CMP14+EGFR  7. Constipation, unspecified constipation type  - CMP14+EGFR  8. Primary osteoarthritis involving multiple joints - CMP14+EGFR  9. Deficiency anemia - CMP14+EGFR  10. Pure hypercholesterolemia - CMP14+EGFR  11. Overactive bladder - oxybutynin  (DITROPAN -XL) 10 MG 24 hr tablet; Take 1 tablet (10 mg total) by mouth at bedtime.  Dispense: 90 tablet; Refill: 2 - CMP14+EGFR  12. Age-related osteoporosis without current pathological fracture - alendronate  (FOSAMAX ) 70 MG tablet; Take 1 tablet (70 mg total) by mouth every 7 (seven) days. Take with a full glass of water on an empty stomach.  Dispense: 12 tablet; Refill: 1 - CMP14+EGFR   Labs pending Continue current medications  Health Maintenance reviewed Diet and exercise encouraged  Follow up plan: 6 months    Bari Learn, FNP

## 2024-03-01 ENCOUNTER — Ambulatory Visit: Payer: Self-pay | Admitting: Family

## 2024-03-14 DIAGNOSIS — R059 Cough, unspecified: Secondary | ICD-10-CM | POA: Diagnosis not present

## 2024-03-23 ENCOUNTER — Telehealth

## 2024-03-23 DIAGNOSIS — B9689 Other specified bacterial agents as the cause of diseases classified elsewhere: Secondary | ICD-10-CM

## 2024-03-24 DIAGNOSIS — R051 Acute cough: Secondary | ICD-10-CM | POA: Diagnosis not present

## 2024-03-24 NOTE — Progress Notes (Signed)
  Because of persistent and worsening symptoms despite treatment with antibiotics, I feel your condition warrants further evaluation and I recommend that you be seen in a face-to-face visit.   NOTE: There will be NO CHARGE for this E-Visit   If you are having a true medical emergency, please call 911.     For an urgent face to face visit, [facility] has multiple urgent care centers for your convenience.  Click the link below for the full list of locations and hours, walk-in wait times, appointment scheduling options and driving directions:  Urgent Care - [place], [place], [place], [place], [place], [place]  [place]     Your MyChart E-visit questionnaire answers were reviewed by a board certified advanced clinical practitioner to complete your personal care plan based on your specific symptoms.    Thank you for using e-Visits.

## 2024-04-12 ENCOUNTER — Ambulatory Visit: Admitting: Family

## 2024-04-12 ENCOUNTER — Ambulatory Visit

## 2024-05-27 ENCOUNTER — Ambulatory Visit: Payer: Self-pay

## 2024-05-27 VITALS — BP 127/82 | HR 75 | Ht <= 58 in | Wt 111.0 lb

## 2024-05-27 DIAGNOSIS — Z0000 Encounter for general adult medical examination without abnormal findings: Secondary | ICD-10-CM | POA: Diagnosis not present

## 2024-05-27 DIAGNOSIS — Z1231 Encounter for screening mammogram for malignant neoplasm of breast: Secondary | ICD-10-CM

## 2024-05-27 NOTE — Progress Notes (Signed)
 Chief Complaint  Patient presents with   Medicare Wellness     Subjective:   Tracy Jensen is a 68 y.o. female who presents for a Medicare Annual Wellness Visit.  Visit info / Clinical Intake: Medicare Wellness Visit Type:: Subsequent Annual Wellness Visit Persons participating in visit and providing information:: patient Medicare Wellness Visit Mode:: Telephone Since this visit was completed virtually, some vitals may be partially provided or unavailable. Missing vitals are due to the limitations of the virtual format.: Documented vitals are patient reported If Telephone or Video please confirm:: I connected with patient using audio/video enable telemedicine. I verified patient identity with two identifiers, discussed telehealth limitations, and patient agreed to proceed. Patient Location:: home Provider Location:: home office Interpreter Needed?: No Pre-visit prep was completed: yes AWV questionnaire completed by patient prior to visit?: no Living arrangements:: lives with spouse/significant other Patient's Overall Health Status Rating: very good Typical amount of pain: none Does pain affect daily life?: no Are you currently prescribed opioids?: no  Dietary Habits and Nutritional Risks How many meals a day?: 3 Eats fruit and vegetables daily?: yes Most meals are obtained by: preparing own meals In the last 2 weeks, have you had any of the following?: none Diabetic:: no  Functional Status Activities of Daily Living (to include ambulation/medication): Independent Ambulation: Independent Medication Administration: Independent Home Management (perform basic housework or laundry): Independent Manage your own finances?: yes Concerns about vision?: no *vision screening is required for WTM* (last ov 2025 w/Dr Jama in [place], [place]) Concerns about hearing?: no  Fall Screening Falls in the past year?: 0 Number of falls in past year: 0 Was there an injury with Fall?: 1 Fall Risk  Category Calculator: 1 Patient Fall Risk Level: Low Fall Risk  Fall Risk Patient at Risk for Falls Due to: No Fall Risks Fall risk Follow up: Falls evaluation completed; Education provided  Home and Transportation Safety: All rugs have non-skid backing?: yes All stairs or steps have railings?: yes Grab bars in the bathtub or shower?: yes Have non-skid surface in bathtub or shower?: yes Good home lighting?: yes Regular seat belt use?: yes Hospital stays in the last year:: no  Cognitive Assessment Difficulty concentrating, remembering, or making decisions? : no Will 6CIT or Mini Cog be Completed: yes What year is it?: 0 points What month is it?: 0 points Give patient an address phrase to remember (5 components): [address] [place] About what time is it?: 0 points Count backwards from 20 to 1: 0 points Say the months of the year in reverse: 0 points Repeat the address phrase from earlier: 0 points 6 CIT Score: 0 points  Advance Directives (For Healthcare) Does Patient Have a Medical Advance Directive?: No Would patient like information on creating a medical advance directive?: No - Patient declined  Reviewed/Updated  Reviewed/Updated: Reviewed All (Medical, Surgical, Family, Medications, Allergies, Care Teams, Patient Goals); Medical History; Surgical History; Family History; Medications; Allergies; Care Teams; Patient Goals    Allergies (verified) Azithromycin, Omeprazole, and Statins   Current Medications (verified) Outpatient Encounter Medications as of 05/27/2024  Medication Sig   alendronate  (FOSAMAX ) 70 MG tablet Take 1 tablet (70 mg total) by mouth every 7 (seven) days. Take with a full glass of water on an empty stomach.   aspirin EC 81 MG tablet Take 81 mg by mouth at bedtime. Swallow whole.   busPIRone  (BUSPAR ) 15 MG tablet Take 1 tablet (15 mg total) by mouth 3 (three) times daily.  butalbital -acetaminophen -caffeine  (FIORICET) 50-325-40 MG tablet  TAKE 1 TABLET BY MOUTH EVERY 6 HOURS AS NEEDED FOR HEADACHE   Calcium  Carb-Cholecalciferol (CALCIUM  1000 + D PO) Take by mouth.   cholecalciferol (VITAMIN D3) 25 MCG (1000 UNIT) tablet Take 8,000 Units by mouth daily.   Cyanocobalamin  (VITAMIN B-12 PO) Take by mouth.   cyclobenzaprine  (FLEXERIL ) 10 MG tablet Take 1 tablet (10 mg total) by mouth 3 (three) times daily.   fluticasone  (FLONASE ) 50 MCG/ACT nasal spray Place 2 sprays into both nostrils daily.   levocetirizine (XYZAL ) 5 MG tablet Take 1 tablet (5 mg total) by mouth every evening.   lisinopril  (ZESTRIL ) 20 MG tablet Take 1 tablet (20 mg total) by mouth daily. (NEEDS TO BE SEEN BEFORE NEXT REFILL)   meloxicam  (MOBIC ) 7.5 MG tablet Take 1 tablet by mouth once daily   oxybutynin  (DITROPAN -XL) 10 MG 24 hr tablet Take 1 tablet (10 mg total) by mouth at bedtime.   polyethylene glycol (MIRALAX  / GLYCOLAX ) packet Take 17 g by mouth daily as needed for mild constipation or moderate constipation.   rosuvastatin  (CRESTOR ) 5 MG tablet Take 1 tablet (5 mg total) by mouth every other day.   traZODone  (DESYREL ) 50 MG tablet Take 3 tablets (150 mg total) by mouth at bedtime.   No facility-administered encounter medications on file as of 05/27/2024.    History: Past Medical History:  Diagnosis Date   Anemia    Anxiety    Arthritis    Esophageal stricture    Gastric ulcer    GI bleed    Hiatal hernia    Hypertension    Insomnia    Migraines    Sciatica    TIA (transient ischemic attack)    Past Surgical History:  Procedure Laterality Date   CAROTID ARTERY ANGIOPLASTY  09/2000   CATARACT EXTRACTION, BILATERAL Bilateral    CESAREAN SECTION     1991, 1984   COLONOSCOPY     ROTATOR CUFF REPAIR Right 2020   UPPER GASTROINTESTINAL ENDOSCOPY  11/01/2021   Dr.Beavers   Family History  Problem Relation Age of Onset   Diabetes Mother    Asthma Mother    Hypertension Mother    Hypertension Father    Heart disease Father    Colon cancer  Neg Hx    Esophageal cancer Neg Hx    Stomach cancer Neg Hx    Breast cancer Neg Hx    Social History   Occupational History   Occupation: retired  Tobacco Use   Smoking status: Never   Smokeless tobacco: Never  Vaping Use   Vaping status: Never Used  Substance and Sexual Activity   Alcohol use: Yes    Comment: occ   Drug use: No   Sexual activity: Not on file   Tobacco Counseling Counseling given: Yes  SDOH Screenings   Food Insecurity: No Food Insecurity (05/27/2024)  Housing: Unknown (05/27/2024)  Transportation Needs: No Transportation Needs (05/27/2024)  Utilities: Not At Risk (05/27/2024)  Alcohol Screen: Low Risk (05/25/2023)  Depression (PHQ2-9): Low Risk (05/27/2024)  Financial Resource Strain: Low Risk (05/25/2023)  Physical Activity: Sufficiently Active (05/27/2024)  Social Connections: Socially Integrated (05/27/2024)  Stress: No Stress Concern Present (05/27/2024)  Tobacco Use: Low Risk (05/27/2024)  Health Literacy: Adequate Health Literacy (05/27/2024)   See flowsheets for full screening details  Depression Screen PHQ 2 & 9 Depression Scale- Over the past 2 weeks, how often have you been bothered by any of the following problems? Little interest or pleasure  in doing things: 0 Feeling down, depressed, or hopeless (PHQ Adolescent also includes...irritable): 0 PHQ-2 Total Score: 0 Trouble falling or staying asleep, or sleeping too much: 0 Feeling tired or having little energy: 0 Poor appetite or overeating (PHQ Adolescent also includes...weight loss): 0 Feeling bad about yourself - or that you are a failure or have let yourself or your family down: 0 Trouble concentrating on things, such as reading the newspaper or watching television (PHQ Adolescent also includes...like school work): 0 Moving or speaking so slowly that other people could have noticed. Or the opposite - being so fidgety or restless that you have been moving around a lot more than usual:  0 Thoughts that you would be better off dead, or of hurting yourself in some way: 0 PHQ-9 Total Score: 0 If you checked off any problems, how difficult have these problems made it for you to do your work, take care of things at home, or get along with other people?: Not difficult at all     Goals Addressed             This Visit's Progress    Exercise 150 min/wk Moderate Activity   On track    Remain active and independent   On track            Objective:    Today's Vitals   05/27/24 0955  BP: 127/82  Pulse: 75  Weight: 111 lb (50.3 kg)  Height: 4' 9 (1.448 m)   Body mass index is 24.02 kg/m.  Hearing/Vision screen No results found. Immunizations and Health Maintenance Health Maintenance  Topic Date Due   COVID-19 Vaccine (3 - 2025-26 season) 02/15/2024   Mammogram  07/26/2024   Medicare Annual Wellness (AWV)  05/27/2025   Bone Density Scan  06/23/2025   Colonoscopy  12/09/2030   DTaP/Tdap/Td (3 - Td or Tdap) 10/11/2033   Pneumococcal Vaccine: 50+ Years  Completed   Influenza Vaccine  Completed   Hepatitis C Screening  Completed   Zoster Vaccines- Shingrix  Completed   Meningococcal B Vaccine  Aged Out   Hepatitis B Vaccines 19-59 Average Risk  Discontinued        Assessment/Plan:  This is a routine wellness examination for [employer].  Patient Care Team: Lavell Bari LABOR, FNP as PCP - General (Family Medicine) Ladora Ross Lacy Phebe, MD as Referring Physician (Optometry) Anderson Maude ORN, MD (Inactive) as Consulting Physician (Orthopedic Surgery)  I have personally reviewed and noted the following in the patients chart:   Medical and social history Use of alcohol, tobacco or illicit drugs  Current medications and supplements including opioid prescriptions. Functional ability and status Nutritional status Physical activity Advanced directives List of other physicians Hospitalizations, surgeries, and ER visits in previous 12 months Vitals Screenings to  include cognitive, depression, and falls Referrals and appointments  Orders Placed This Encounter  Procedures   MM DIGITAL SCREENING BILATERAL    Standing Status:   Future    Expiration Date:   05/27/2025    Scheduling Instructions:     Mobile after 07/26/24 at [facility]    Preferred imaging location?:   [facility]    Reason for exam::   breast cancer screening    Release to patient:   Immediate   In addition, I have reviewed and discussed with patient certain preventive protocols, quality metrics, and best practice recommendations. A written personalized care plan for preventive services as well as general preventive health recommendations were provided to patient.  Ozie Ned, CMA   05/27/2024   Return in 1 year (on 05/27/2025).  After Visit Summary: (MyChart) Due to this being a telephonic visit, the after visit summary with patients personalized plan was offered to patient via MyChart   Nurse Notes: mammogram-orders placed, pt declined--Covid vaccine, awv appt 05/30/24 at 10:00am

## 2024-06-13 ENCOUNTER — Other Ambulatory Visit: Payer: Self-pay | Admitting: Family

## 2024-06-13 DIAGNOSIS — Z1231 Encounter for screening mammogram for malignant neoplasm of breast: Secondary | ICD-10-CM

## 2024-06-14 ENCOUNTER — Other Ambulatory Visit: Payer: Self-pay | Admitting: Family

## 2024-06-14 DIAGNOSIS — M150 Primary generalized (osteo)arthritis: Secondary | ICD-10-CM

## 2024-06-14 DIAGNOSIS — M7072 Other bursitis of hip, left hip: Secondary | ICD-10-CM

## 2024-06-27 ENCOUNTER — Other Ambulatory Visit: Payer: Self-pay | Admitting: Family

## 2024-06-27 DIAGNOSIS — G4700 Insomnia, unspecified: Secondary | ICD-10-CM

## 2024-08-10 ENCOUNTER — Ambulatory Visit

## 2024-08-30 ENCOUNTER — Ambulatory Visit: Payer: Self-pay | Admitting: Family

## 2025-05-30 ENCOUNTER — Ambulatory Visit
